# Patient Record
Sex: Male | Born: 1955 | Race: White | Hispanic: No | Marital: Single | Smoking: Former smoker
Health system: Southern US, Community
[De-identification: ages and names within clinical notes are randomized; demographics above are authoritative.]

## PROBLEM LIST (undated history)

## (undated) DIAGNOSIS — M1990 Unspecified osteoarthritis, unspecified site: Secondary | ICD-10-CM

## (undated) DIAGNOSIS — R7302 Impaired glucose tolerance (oral): Secondary | ICD-10-CM

## (undated) DIAGNOSIS — E785 Hyperlipidemia, unspecified: Secondary | ICD-10-CM

## (undated) DIAGNOSIS — Z87442 Personal history of urinary calculi: Secondary | ICD-10-CM

## (undated) DIAGNOSIS — T7840XA Allergy, unspecified, initial encounter: Secondary | ICD-10-CM

## (undated) DIAGNOSIS — S83206A Unspecified tear of unspecified meniscus, current injury, right knee, initial encounter: Secondary | ICD-10-CM

## (undated) DIAGNOSIS — R0600 Dyspnea, unspecified: Secondary | ICD-10-CM

## (undated) DIAGNOSIS — N400 Enlarged prostate without lower urinary tract symptoms: Secondary | ICD-10-CM

## (undated) DIAGNOSIS — J45909 Unspecified asthma, uncomplicated: Secondary | ICD-10-CM

## (undated) DIAGNOSIS — E119 Type 2 diabetes mellitus without complications: Secondary | ICD-10-CM

## (undated) HISTORY — DX: Benign prostatic hyperplasia without lower urinary tract symptoms: N40.0

## (undated) HISTORY — PX: CATARACT EXTRACTION: SUR2

## (undated) HISTORY — DX: Allergy, unspecified, initial encounter: T78.40XA

## (undated) HISTORY — DX: Impaired glucose tolerance (oral): R73.02

## (undated) HISTORY — PX: WISDOM TOOTH EXTRACTION: SHX21

## (undated) HISTORY — DX: Unspecified tear of unspecified meniscus, current injury, right knee, initial encounter: S83.206A

## (undated) HISTORY — DX: Unspecified asthma, uncomplicated: J45.909

## (undated) HISTORY — DX: Hyperlipidemia, unspecified: E78.5

---
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
## 2006-04-20 ENCOUNTER — Ambulatory Visit (HOSPITAL_COMMUNITY): Admission: RE | Admit: 2006-04-20 | Discharge: 2006-04-20 | Payer: Self-pay | Admitting: Pulmonary Disease

## 2012-05-12 ENCOUNTER — Encounter: Payer: Self-pay | Admitting: Internal Medicine

## 2012-05-12 DIAGNOSIS — J45909 Unspecified asthma, uncomplicated: Secondary | ICD-10-CM

## 2012-05-12 DIAGNOSIS — Z0000 Encounter for general adult medical examination without abnormal findings: Secondary | ICD-10-CM | POA: Insufficient documentation

## 2012-05-12 HISTORY — DX: Unspecified asthma, uncomplicated: J45.909

## 2012-05-19 ENCOUNTER — Encounter: Payer: Self-pay | Admitting: Internal Medicine

## 2012-05-19 ENCOUNTER — Ambulatory Visit (INDEPENDENT_AMBULATORY_CARE_PROVIDER_SITE_OTHER): Payer: 59 | Admitting: Internal Medicine

## 2012-05-19 ENCOUNTER — Other Ambulatory Visit (INDEPENDENT_AMBULATORY_CARE_PROVIDER_SITE_OTHER): Payer: 59

## 2012-05-19 VITALS — BP 120/58 | HR 101 | Temp 98.7°F | Ht 64.0 in | Wt 205.2 lb

## 2012-05-19 DIAGNOSIS — Z0000 Encounter for general adult medical examination without abnormal findings: Secondary | ICD-10-CM

## 2012-05-19 DIAGNOSIS — R7303 Prediabetes: Secondary | ICD-10-CM | POA: Insufficient documentation

## 2012-05-19 DIAGNOSIS — E1169 Type 2 diabetes mellitus with other specified complication: Secondary | ICD-10-CM | POA: Insufficient documentation

## 2012-05-19 DIAGNOSIS — Z23 Encounter for immunization: Secondary | ICD-10-CM

## 2012-05-19 DIAGNOSIS — R7302 Impaired glucose tolerance (oral): Secondary | ICD-10-CM

## 2012-05-19 DIAGNOSIS — N400 Enlarged prostate without lower urinary tract symptoms: Secondary | ICD-10-CM

## 2012-05-19 HISTORY — DX: Impaired glucose tolerance (oral): R73.02

## 2012-05-19 HISTORY — DX: Benign prostatic hyperplasia without lower urinary tract symptoms: N40.0

## 2012-05-19 LAB — URINALYSIS, ROUTINE W REFLEX MICROSCOPIC
Hgb urine dipstick: NEGATIVE
Nitrite: NEGATIVE
Specific Gravity, Urine: 1.025 (ref 1.000–1.030)
Urine Glucose: NEGATIVE
Urobilinogen, UA: 0.2 (ref 0.0–1.0)

## 2012-05-19 LAB — CBC WITH DIFFERENTIAL/PLATELET
Eosinophils Absolute: 0.4 10*3/uL (ref 0.0–0.7)
HCT: 42.8 % (ref 39.0–52.0)
Lymphs Abs: 3.2 10*3/uL (ref 0.7–4.0)
MCHC: 33.4 g/dL (ref 30.0–36.0)
Neutrophils Relative %: 60.9 % (ref 43.0–77.0)
Platelets: 315 10*3/uL (ref 150.0–400.0)
RDW: 13.6 % (ref 11.5–14.6)

## 2012-05-19 LAB — PSA: PSA: 0.87 ng/mL (ref 0.10–4.00)

## 2012-05-19 LAB — HEPATIC FUNCTION PANEL
ALT: 36 U/L (ref 0–53)
Albumin: 4.1 g/dL (ref 3.5–5.2)
Total Protein: 7.1 g/dL (ref 6.0–8.3)

## 2012-05-19 LAB — LIPID PANEL
HDL: 57.7 mg/dL (ref >39.00)
LDL Cholesterol: 109 mg/dL — ABNORMAL HIGH (ref 0–99)
Total CHOL/HDL Ratio: 3
Triglycerides: 97 mg/dL (ref 0.0–149.0)

## 2012-05-19 LAB — BASIC METABOLIC PANEL
Calcium: 9.7 mg/dL (ref 8.4–10.5)
Chloride: 105 mEq/L (ref 96–112)
Creatinine, Ser: 0.9 mg/dL (ref 0.4–1.5)
Glucose, Bld: 146 mg/dL — ABNORMAL HIGH (ref 70–99)
Potassium: 4.3 mEq/L (ref 3.5–5.1)

## 2012-05-19 MED ORDER — ASPIRIN 81 MG PO TBEC: 81.0000 mg | DELAYED_RELEASE_TABLET | Freq: Every day | ORAL | Status: AC

## 2012-05-19 MED ORDER — DUTASTERIDE 0.5 MG PO CAPS: 0.5000 mg | ORAL_CAPSULE | Freq: Every day | ORAL | Status: DC

## 2012-05-19 MED ORDER — FLUTICASONE-SALMETEROL 100-50 MCG/DOSE IN AEPB: 2.0000 | INHALATION_SPRAY | Freq: Every day | RESPIRATORY_TRACT | Status: DC

## 2012-05-19 MED ORDER — IPRATROPIUM-ALBUTEROL 18-103 MCG/ACT IN AERO: 2.0000 | INHALATION_SPRAY | Freq: Four times a day (QID) | RESPIRATORY_TRACT | Status: DC

## 2012-05-19 NOTE — Patient Instructions (Addendum)
Please sign the Release of Information form to get records from Dr Brunetta Jeans had the tetanus shot today Please call if you change your mind about the screening colonoscopy Please go to LAB in the Basement for the blood and/or urine tests to be done today You will be contacted by phone if any changes need to be made immediately.  Otherwise, you will receive a letter about your results with an explanation. Please start the Aspirin 81 mg - 1 per day - Coated only Please return in 1 year for your yearly visit, or sooner if needed, with Lab testing done 3-5 days before

## 2012-05-20 ENCOUNTER — Encounter: Payer: Self-pay | Admitting: Internal Medicine

## 2012-05-20 NOTE — Progress Notes (Signed)
Subjective:    Patient ID: Aaron Drake, male    DOB: 1956/01/12, 56 y.o.   MRN: [id number]  HPI  Here for wellness and to establish as new pt;  Overall doing ok;  Pt denies CP, worsening SOB, DOE, wheezing, orthopnea, PND, worsening LE edema, palpitations, dizziness or syncope.  Pt denies neurological change such as new Headache, facial or extremity weakness.  Pt denies polydipsia, polyuria, or low sugar symptoms. Pt states overall good compliance with treatment and medications, good tolerability, and trying to follow lower cholesterol diet.  Pt denies worsening depressive symptoms, suicidal ideation or panic. No fever, wt loss, night sweats, loss of appetite, or other constitutional symptoms.  Pt states good ability with ADL's, low fall risk, home safety reviewed and adequate, no significant changes in hearing or vision, and occasionally active with exercise.  Neds med refills as has been working well.   Past Medical History  Diagnosis Date  . Asthma 05/12/2012  . BPH (benign prostatic hyperplasia) 05/19/2012  . Allergy   . Hyperlipidemia   . Impaired glucose tolerance 05/19/2012   Past Surgical History  Procedure Date  . Cataract extraction     right    reports that he has quit smoking. He has never used smokeless tobacco. He reports that he drinks about .6 ounces of alcohol per week. He reports that he does not use illicit drugs. family history includes Aortic aneurysm in his father; COPD in his father; Heart disease in his mother; and Stroke in his father. Allergies  Allergen Reactions  . Lipitor (Atorvastatin)    Current Outpatient Prescriptions on File Prior to Visit  Medication Sig Dispense Refill  . albuterol-ipratropium (COMBIVENT) 18-103 MCG/ACT inhaler Inhale 2 puffs into the lungs 4 (four) times daily.  4 Inhaler  3  . Fluticasone-Salmeterol (ADVAIR) 100-50 MCG/DOSE AEPB Inhale 2 puffs into the lungs daily.  180 each  3   Review of Systems Review of Systems  Constitutional:  Negative for diaphoresis, activity change, appetite change and unexpected weight change.  HENT: Negative for hearing loss, ear pain, facial swelling, mouth sores and neck stiffness.   Eyes: Negative for pain, redness and visual disturbance.  Respiratory: Negative for shortness of breath and wheezing.   Cardiovascular: Negative for chest pain and palpitations.  Gastrointestinal: Negative for diarrhea, blood in stool, abdominal distention and rectal pain.  Genitourinary: Negative for hematuria, flank pain and decreased urine volume.  Musculoskeletal: Negative for myalgias and joint swelling.  Skin: Negative for color change and wound.  Neurological: Negative for syncope and numbness.  Hematological: Negative for adenopathy.  Psychiatric/Behavioral: Negative for hallucinations, self-injury, decreased concentration and agitation.      Objective:   Physical Exam BP 120/58  Pulse 101  Temp 98.7 F (37.1 C) (Oral)  Ht 5\' 4"  (1.626 m)  Wt 205 lb 4 oz (93.101 kg)  BMI 35.23 kg/m2  SpO2 95% Physical Exam  VS noted Constitutional: Pt is oriented to person, place, and time. Appears well-developed and well-nourished.  HENT:  Head: Normocephalic and atraumatic.  Right Ear: External ear normal.  Left Ear: External ear normal.  Nose: Nose normal.  Mouth/Throat: Oropharynx is clear and moist.  Eyes: Conjunctivae and EOM are normal. Pupils are equal, round, and reactive to light.  Neck: Normal range of motion. Neck supple. No JVD present. No tracheal deviation present.  Cardiovascular: Normal rate, regular rhythm, normal heart sounds and intact distal pulses.   Pulmonary/Chest: Effort normal and breath sounds normal.  Abdominal: Soft. Bowel  sounds are normal. There is no tenderness.  Musculoskeletal: Normal range of motion. Exhibits no edema.  Lymphadenopathy:  Has no cervical adenopathy.  Neurological: Pt is alert and oriented to person, place, and time. Pt has normal reflexes. No cranial nerve  deficit. Motor/gait intact Skin: Skin is warm and dry. No rash noted.  Psychiatric:  Has  normal mood and affect. Behavior is normal.     Assessment & Plan:

## 2012-05-20 NOTE — Assessment & Plan Note (Signed)

## 2012-05-23 ENCOUNTER — Encounter: Payer: Self-pay | Admitting: Internal Medicine

## 2012-05-25 ENCOUNTER — Other Ambulatory Visit (INDEPENDENT_AMBULATORY_CARE_PROVIDER_SITE_OTHER): Payer: 59

## 2012-05-25 ENCOUNTER — Encounter: Payer: Self-pay | Admitting: Internal Medicine

## 2012-05-25 DIAGNOSIS — Z0000 Encounter for general adult medical examination without abnormal findings: Secondary | ICD-10-CM

## 2012-05-25 LAB — URINALYSIS, ROUTINE W REFLEX MICROSCOPIC
Bilirubin Urine: NEGATIVE
Hgb urine dipstick: NEGATIVE
Ketones, ur: NEGATIVE
Nitrite: NEGATIVE
Urine Glucose: NEGATIVE
Urobilinogen, UA: 0.2 (ref 0.0–1.0)

## 2012-05-25 LAB — CBC WITH DIFFERENTIAL/PLATELET
Basophils Absolute: 0.1 10*3/uL (ref 0.0–0.1)
Basophils Relative: 0.9 % (ref 0.0–3.0)
Eosinophils Absolute: 0.5 10*3/uL (ref 0.0–0.7)
HCT: 43.1 % (ref 39.0–52.0)
Lymphocytes Relative: 32.2 % (ref 12.0–46.0)
MCHC: 33.2 g/dL (ref 30.0–36.0)
MCV: 86.8 fl (ref 78.0–100.0)
Monocytes Relative: 5.5 % (ref 3.0–12.0)
Neutro Abs: 4.5 10*3/uL (ref 1.4–7.7)
Platelets: 308 10*3/uL (ref 150.0–400.0)

## 2012-05-25 LAB — HEPATIC FUNCTION PANEL
Alkaline Phosphatase: 56 U/L (ref 39–117)
Bilirubin, Direct: 0.1 mg/dL (ref 0.0–0.3)
Total Bilirubin: 0.5 mg/dL (ref 0.3–1.2)
Total Protein: 7.2 g/dL (ref 6.0–8.3)

## 2012-05-25 LAB — LIPID PANEL: VLDL: 11.6 mg/dL (ref 0.0–40.0)

## 2012-05-25 LAB — BASIC METABOLIC PANEL
BUN: 14 mg/dL (ref 6–23)
Calcium: 10 mg/dL (ref 8.4–10.5)
GFR: 79.83 mL/min (ref >60.00)
Potassium: 5.2 mEq/L — ABNORMAL HIGH (ref 3.5–5.1)

## 2012-05-25 LAB — LDL CHOLESTEROL, DIRECT: Direct LDL: 115.2 mg/dL

## 2012-06-05 ENCOUNTER — Telehealth: Payer: Self-pay

## 2012-06-05 NOTE — Telephone Encounter (Signed)
Patient requesting a new prescription for Combivent Respimat.  The patient states the old version of Combivent is no longer being produced that  respimat has replaced. Please send to [facility].

## 2012-06-05 NOTE — Telephone Encounter (Signed)
Ok to change  - to [facility] per office refill policy

## 2012-06-06 MED ORDER — IPRATROPIUM-ALBUTEROL 20-100 MCG/ACT IN AERS: 2.0000 | INHALATION_SPRAY | Freq: Four times a day (QID) | RESPIRATORY_TRACT | Status: DC

## 2012-06-06 NOTE — Telephone Encounter (Signed)
Sent in new inhaler per patient request to [facility].

## 2013-03-06 ENCOUNTER — Other Ambulatory Visit: Payer: Self-pay | Admitting: Internal Medicine

## 2013-12-18 ENCOUNTER — Telehealth: Payer: Self-pay

## 2013-12-18 MED ORDER — IPRATROPIUM-ALBUTEROL 20-100 MCG/ACT IN AERS: 1.0000 | INHALATION_SPRAY | Freq: Four times a day (QID) | RESPIRATORY_TRACT | Status: DC | PRN

## 2013-12-18 MED ORDER — DUTASTERIDE 0.5 MG PO CAPS: 0.5000 mg | ORAL_CAPSULE | Freq: Every day | ORAL | Status: DC

## 2013-12-18 MED ORDER — FLUTICASONE-SALMETEROL 100-50 MCG/DOSE IN AEPB: 2.0000 | INHALATION_SPRAY | Freq: Every day | RESPIRATORY_TRACT | Status: DC

## 2013-12-18 NOTE — Telephone Encounter (Signed)
refill 

## 2014-01-10 ENCOUNTER — Ambulatory Visit (INDEPENDENT_AMBULATORY_CARE_PROVIDER_SITE_OTHER): Payer: 59 | Admitting: Internal Medicine

## 2014-01-10 ENCOUNTER — Encounter: Payer: Self-pay | Admitting: Internal Medicine

## 2014-01-10 VITALS — BP 130/78 | HR 105 | Temp 98.4°F | Ht 64.0 in | Wt 220.2 lb

## 2014-01-10 DIAGNOSIS — Z0000 Encounter for general adult medical examination without abnormal findings: Secondary | ICD-10-CM

## 2014-01-10 DIAGNOSIS — J45909 Unspecified asthma, uncomplicated: Secondary | ICD-10-CM

## 2014-01-10 DIAGNOSIS — M25512 Pain in left shoulder: Secondary | ICD-10-CM

## 2014-01-10 DIAGNOSIS — R7302 Impaired glucose tolerance (oral): Secondary | ICD-10-CM

## 2014-01-10 DIAGNOSIS — Z23 Encounter for immunization: Secondary | ICD-10-CM

## 2014-01-10 MED ORDER — UMECLIDINIUM-VILANTEROL 62.5-25 MCG/INH IN AEPB: 1.0000 "application " | INHALATION_SPRAY | Freq: Every day | RESPIRATORY_TRACT | Status: DC

## 2014-01-10 NOTE — Progress Notes (Signed)
Pre-visit discussion using our clinic review tool. No additional management support is needed unless otherwise documented below in the visit note.  

## 2014-01-10 NOTE — Assessment & Plan Note (Signed)
Asympt, for a1c 

## 2014-01-10 NOTE — Assessment & Plan Note (Signed)
Ok to change to [employer],

## 2014-01-10 NOTE — Progress Notes (Signed)
Subjective:    Patient ID: Aaron Drake, male    DOB: [birth year], 58 y.o.   MRN: [id number]  HPI  Here for wellness and f/u;  Overall doing ok;  Pt denies CP, worsening SOB, DOE, wheezing, orthopnea, PND, worsening LE edema, palpitations, dizziness or syncope.  Pt denies neurological change such as new headache, facial or extremity weakness.  Pt denies polydipsia, polyuria, or low sugar symptoms. Pt states overall good compliance with treatment and medications, good tolerability, and has been trying to follow lower cholesterol diet.  Pt denies worsening depressive symptoms, suicidal ideation or panic. No fever, night sweats, wt loss, loss of appetite, or other constitutional symptoms.  Pt states good ability with ADL's, has low fall risk, home safety reviewed and adequate, no other significant changes in hearing or vision, and only occasionally active with exercise.  Declines colonoscopy.  Does not like the combivent, wants to change if possible. No acute complaints Past Medical History  Diagnosis Date  . Asthma 05/12/2012  . BPH (benign prostatic hyperplasia) 05/19/2012  . Allergy   . Hyperlipidemia   . Impaired glucose tolerance 05/19/2012   Past Surgical History  Procedure Laterality Date  . Cataract extraction      right    reports that he has quit smoking. He has never used smokeless tobacco. He reports that he drinks about 0.6 ounces of alcohol per week. He reports that he does not use illicit drugs. family history includes Aortic aneurysm in his father; COPD in his father; Heart disease in his mother; Stroke in his father. Allergies  Allergen Reactions  . Lipitor [Atorvastatin]    Current Outpatient Prescriptions on File Prior to Visit  Medication Sig Dispense Refill  . dutasteride (AVODART) 0.5 MG capsule Take 1 capsule (0.5 mg total) by mouth daily.  90 capsule  3  . Fluticasone-Salmeterol (ADVAIR) 100-50 MCG/DOSE AEPB Inhale 2 puffs into the lungs daily.  180 each  3  .  Ipratropium-Albuterol (COMBIVENT RESPIMAT) 20-100 MCG/ACT AERS respimat Inhale 1 puff into the lungs every 6 (six) hours as needed for wheezing.  3 Inhaler  3   No current facility-administered medications on file prior to visit.   Review of Systems Constitutional: Negative for diaphoresis, activity change, appetite change or unexpected weight change.  HENT: Negative for hearing loss, ear pain, facial swelling, mouth sores and neck stiffness.   Eyes: Negative for pain, redness and visual disturbance.  Respiratory: Negative for shortness of breath and wheezing.   Cardiovascular: Negative for chest pain and palpitations.  Gastrointestinal: Negative for diarrhea, blood in stool, abdominal distention or other pain Genitourinary: Negative for hematuria, flank pain or change in urine volume.  Musculoskeletal: Negative for myalgias and joint swelling.  Skin: Negative for color change and wound.  Neurological: Negative for syncope and numbness. other than noted Hematological: Negative for adenopathy.  Psychiatric/Behavioral: Negative for hallucinations, self-injury, decreased concentration and agitation.      Objective:   Physical Exam BP 130/78  Pulse 105  Temp(Src) 98.4 F (36.9 C) (Oral)  Ht 5\' 4"  (1.626 m)  Wt 220 lb 4 oz (99.905 kg)  BMI 37.79 kg/m2  SpO2 95% VS noted,  Constitutional: Pt is oriented to person, place, and time. Appears well-developed and well-nourished.  Head: Normocephalic and atraumatic.  Right Ear: External ear normal.  Left Ear: External ear normal.  Nose: Nose normal.  Mouth/Throat: Oropharynx is clear and moist.  Eyes: Conjunctivae and EOM are normal. Pupils are equal, round, and reactive to light.  Neck: Normal range of motion. Neck supple. No JVD present. No tracheal deviation present.  Cardiovascular: Normal rate, regular rhythm, normal heart sounds and intact distal pulses.   Pulmonary/Chest: Effort normal and breath sounds normal.  Abdominal: Soft.  Bowel sounds are normal. There is no tenderness. No HSM  Musculoskeletal: Normal range of motion. Exhibits no edema.  Lymphadenopathy:  Has no cervical adenopathy.  Neurological: Pt is alert and oriented to person, place, and time. Pt has normal reflexes. No cranial nerve deficit.  Skin: Skin is warm and dry. No rash noted.  Psychiatric:  Has  normal mood and affect. Behavior is normal.     Assessment & Plan:

## 2014-01-10 NOTE — Patient Instructions (Addendum)
You had the flu shot today Please take all new medication as prescribed - the anoro ellipta  OK to stop the combivent if you like the anoro ellipta Please continue all other medications as before, and refills have been done if requested. Please have the pharmacy call with any other refills you may need. Please continue your efforts at being more active, low cholesterol diet, and weight control. You are otherwise up to date with prevention measures today. Please keep your appointments with your specialists as you may have planned  You will be contacted regarding the referral for: Dr Smith/sport medicine (in our office) for the left shoulder  Please go to the LAB in the Basement (turn left off the elevator) for the tests to be done today You will be contacted by phone if any changes need to be made immediately.  Otherwise, you will receive a letter about your results with an explanation, but please check with MyChart first.  Please return in 1 year for your yearly visit, or sooner if needed, with Lab testing done 3-5 days before

## 2014-01-10 NOTE — Assessment & Plan Note (Signed)

## 2014-01-11 ENCOUNTER — Other Ambulatory Visit (INDEPENDENT_AMBULATORY_CARE_PROVIDER_SITE_OTHER): Payer: 59

## 2014-01-11 DIAGNOSIS — E785 Hyperlipidemia, unspecified: Secondary | ICD-10-CM

## 2014-01-11 DIAGNOSIS — Z0000 Encounter for general adult medical examination without abnormal findings: Secondary | ICD-10-CM

## 2014-01-11 DIAGNOSIS — R7309 Other abnormal glucose: Secondary | ICD-10-CM

## 2014-01-11 DIAGNOSIS — R7302 Impaired glucose tolerance (oral): Secondary | ICD-10-CM

## 2014-01-11 LAB — URINALYSIS, ROUTINE W REFLEX MICROSCOPIC
Bilirubin Urine: NEGATIVE
KETONES UR: NEGATIVE
LEUKOCYTES UA: NEGATIVE
Nitrite: NEGATIVE
Specific Gravity, Urine: 1.02 (ref 1.000–1.030)
Total Protein, Urine: NEGATIVE
UROBILINOGEN UA: 0.2 (ref 0.0–1.0)
Urine Glucose: NEGATIVE
pH: 7 (ref 5.0–8.0)

## 2014-01-11 LAB — BASIC METABOLIC PANEL
BUN: 11 mg/dL (ref 6–23)
CALCIUM: 10.1 mg/dL (ref 8.4–10.5)
CO2: 26 mEq/L (ref 19–32)
Chloride: 104 mEq/L (ref 96–112)
Creatinine, Ser: 0.8 mg/dL (ref 0.4–1.5)
GFR: 99.31 mL/min (ref >60.00)
GLUCOSE: 113 mg/dL — AB (ref 70–99)
POTASSIUM: 4.6 meq/L (ref 3.5–5.1)
SODIUM: 138 meq/L (ref 135–145)

## 2014-01-11 LAB — CBC WITH DIFFERENTIAL/PLATELET
BASOS ABS: 0 10*3/uL (ref 0.0–0.1)
Basophils Relative: 0.4 % (ref 0.0–3.0)
Eosinophils Absolute: 0.7 10*3/uL (ref 0.0–0.7)
Eosinophils Relative: 7.1 % — ABNORMAL HIGH (ref 0.0–5.0)
HCT: 44.9 % (ref 39.0–52.0)
HEMOGLOBIN: 14.6 g/dL (ref 13.0–17.0)
Lymphocytes Relative: 30.7 % (ref 12.0–46.0)
Lymphs Abs: 3 10*3/uL (ref 0.7–4.0)
MCHC: 32.5 g/dL (ref 30.0–36.0)
MCV: 88.4 fl (ref 78.0–100.0)
MONO ABS: 0.7 10*3/uL (ref 0.1–1.0)
Monocytes Relative: 7.2 % (ref 3.0–12.0)
Neutro Abs: 5.3 10*3/uL (ref 1.4–7.7)
Neutrophils Relative %: 54.6 % (ref 43.0–77.0)
PLATELETS: 355 10*3/uL (ref 150.0–400.0)
RBC: 5.08 Mil/uL (ref 4.22–5.81)
RDW: 14.7 % — AB (ref 11.5–14.6)
WBC: 9.8 10*3/uL (ref 4.5–10.5)

## 2014-01-11 LAB — TSH: TSH: 1.51 u[IU]/mL (ref 0.35–5.50)

## 2014-01-11 LAB — LIPID PANEL
CHOL/HDL RATIO: 4
Cholesterol: 206 mg/dL — ABNORMAL HIGH (ref 0–200)
HDL: 53.5 mg/dL (ref >39.00)
Triglycerides: 101 mg/dL (ref 0.0–149.0)
VLDL: 20.2 mg/dL (ref 0.0–40.0)

## 2014-01-11 LAB — HEPATIC FUNCTION PANEL
ALK PHOS: 66 U/L (ref 39–117)
ALT: 36 U/L (ref 0–53)
AST: 24 U/L (ref 0–37)
Albumin: 4.2 g/dL (ref 3.5–5.2)
BILIRUBIN DIRECT: 0.1 mg/dL (ref 0.0–0.3)
BILIRUBIN TOTAL: 0.7 mg/dL (ref 0.3–1.2)
Total Protein: 7.5 g/dL (ref 6.0–8.3)

## 2014-01-11 LAB — HEMOGLOBIN A1C: Hgb A1c MFr Bld: 7.8 % — ABNORMAL HIGH (ref 4.6–6.5)

## 2014-01-11 LAB — PSA: PSA: 0.21 ng/mL (ref 0.10–4.00)

## 2014-01-11 LAB — LDL CHOLESTEROL, DIRECT: LDL DIRECT: 136.6 mg/dL

## 2014-01-13 ENCOUNTER — Other Ambulatory Visit: Payer: Self-pay | Admitting: Internal Medicine

## 2014-01-13 MED ORDER — METFORMIN HCL ER 500 MG PO TB24: 500.0000 mg | ORAL_TABLET | Freq: Every day | ORAL | Status: DC

## 2014-01-13 MED ORDER — SIMVASTATIN 20 MG PO TABS: 20.0000 mg | ORAL_TABLET | Freq: Every day | ORAL | Status: DC

## 2014-01-18 ENCOUNTER — Ambulatory Visit: Payer: 59 | Admitting: Family Medicine

## 2014-01-28 ENCOUNTER — Encounter: Payer: Self-pay | Admitting: Family Medicine

## 2014-01-28 ENCOUNTER — Other Ambulatory Visit (INDEPENDENT_AMBULATORY_CARE_PROVIDER_SITE_OTHER): Payer: 59

## 2014-01-28 ENCOUNTER — Ambulatory Visit (INDEPENDENT_AMBULATORY_CARE_PROVIDER_SITE_OTHER): Payer: 59 | Admitting: Family Medicine

## 2014-01-28 VITALS — BP 124/70 | HR 97 | Temp 98.6°F | Resp 16 | Wt 218.4 lb

## 2014-01-28 DIAGNOSIS — M19019 Primary osteoarthritis, unspecified shoulder: Secondary | ICD-10-CM

## 2014-01-28 DIAGNOSIS — M25512 Pain in left shoulder: Secondary | ICD-10-CM

## 2014-01-28 DIAGNOSIS — M75102 Unspecified rotator cuff tear or rupture of left shoulder, not specified as traumatic: Secondary | ICD-10-CM

## 2014-01-28 DIAGNOSIS — M25519 Pain in unspecified shoulder: Secondary | ICD-10-CM

## 2014-01-28 DIAGNOSIS — M12812 Other specific arthropathies, not elsewhere classified, left shoulder: Secondary | ICD-10-CM

## 2014-01-28 NOTE — Progress Notes (Signed)
[facility] [address], [place] [address] Phone: [phone] Subjective:    I'm seeing this patient by the request  of:  Cathlean Cower, MD   CC: Left shoulder pain  [id number] SANAV REMER is a 59 y.o. male coming in with complaint of left shoulder pain. Patient states he has had multiple months but the last 2 weeks has had an exacerbation. Patient is a remember any true injury to this shoulder. Patient is ordered a [employer] and did have to do some moving of heavy objects that may have exacerbated the problem. Patient states that it seems to be localized in the posterior lateral aspect of the shoulder without any significant radiation down the arm or any numbness or weakness of the upper extremity. Pain can wake him up at night. He has noticed some mild improvement with glucosamine over-the-counter. Other than that he has not tried any other modalities. Patient states that the severity can go from 1/10 to as severe as 8/10 depending on what is going on.     Past medical history, social, surgical and family history all reviewed in electronic medical record.   Review of Systems: No headache, visual changes, nausea, vomiting, diarrhea, constipation, dizziness, abdominal pain, skin rash, fevers, chills, night sweats, weight loss, swollen lymph nodes, body aches, joint swelling, muscle aches, chest pain, shortness of breath, mood changes.   Objective Blood pressure 124/70, pulse 97, temperature 98.6 F (37 C), temperature source Oral, resp. rate 16, weight 218 lb 6.4 oz (99.066 kg), SpO2 95.00%.  General: No apparent distress alert and oriented x3 mood and affect normal, dressed appropriately.  HEENT: Pupils equal, extraocular movements intact  Respiratory: Patient's speak in full sentences and does not appear short of breath  Cardiovascular: No lower extremity edema, non tender, no erythema  Skin: Warm dry intact with no signs of infection or rash on  extremities or on axial skeleton.  Abdomen: Soft nontender  Neuro: Cranial nerves II through XII are intact, neurovascularly intact in all extremities with 2+ DTRs and 2+ pulses.  Lymph: No lymphadenopathy of posterior or anterior cervical chain or axillae bilaterally.  Gait normal with good balance and coordination.  MSK:  Non tender with full range of motion and good stability and symmetric strength and tone of  elbows, wrist, hip, knee and ankles bilaterally.  Shoulder: Left Inspection reveals no abnormalities, atrophy or asymmetry. Palpation is normal with no tenderness over AC joint or bicipital groove. ROM is full in all planes passively Rotator cuff strength normal throughout.  signs of impingement with negative Neer and Hawkin's tests, negative empty can sign. Speeds and Yergason's tests normal. No labral pathology noted with negative Obrien's, negative clunk and good stability. Normal scapular function observed. No painful arc and no drop arm sign. No apprehension sign Contralateral shoulder unremarkable  MSK US performed of: Left shoulder This study was ordered, performed, and interpreted by Charlann Boxer D.O.  Shoulder:   Supraspinatus:  Patient does have an intersubstance tear that appears to be chronic in nature with no significant hypoechoic changes. There is no retraction noted. Underlying osteoarthritis noted. Infraspinatus:  Appears normal on long and transverse views. Subscapularis:  Degenerative tearing noted.. Teres Minor:  Appears normal on long and transverse views. AC joint:  Moderate osteoarthritic changes with moderate hypoechoic changes to Glenohumeral Joint:  Moderate osteophytic changes Glenoid Labrum:  Intact without visualized tears. Biceps Tendon:  Appears normal on long and transverse views, no fraying of tendon,  tendon located in intertubercular groove, no subluxation with shoulder internal or external rotation. No increased power doppler  signal.  Impression: Acute on chronic rotator cuff tear with underlying arthritis.  Procedure: Real-time Ultrasound Guided Injection of left glenohumeral joint Device: GE Logiq E  Ultrasound guided injection is preferred based studies that show increased duration, increased effect, greater accuracy, decreased procedural pain, increased response rate with ultrasound guided versus blind injection.  Verbal informed consent obtained.  Time-out conducted.  Noted no overlying erythema, induration, or other signs of local infection.  Skin prepped in a sterile fashion.  Local anesthesia: Topical Ethyl chloride.  With sterile technique and under real time ultrasound guidance:  Joint visualized.  23g 1  inch needle inserted posterior approach. Pictures taken for needle placement. Patient did have injection of 2 cc of 1% lidocaine, 2 cc of 0.5% Marcaine, and 1cc of Kenalog 40 mg/dL. Completed without difficulty  Pain immediately resolved suggesting accurate placement of the medication.  Advised to call if fevers/chills, erythema, induration, drainage, or persistent bleeding.  Images permanently stored and available for review in the ultrasound unit.  Impression: Technically successful ultrasound guided injection.     Impression and Recommendations:     This case required medical decision making of moderate complexity.

## 2014-01-28 NOTE — Patient Instructions (Signed)
Good to meet you Avoid any significant lifting next 24 hours.  Ice 20 minutes 2 times a day Exercises most days of the week.  Take tylenol 650 mg three times a day is the best evidence based medicine we have for arthritis.  Glucosamine sulfate 750mg  twice a day is a supplement that has been shown to help moderate to severe arthritis. Vitamin D 2000 IU daily Fish oil 2 grams daily.  Tumeric 500mg  twice daily.  Capsaicin topically up to four times a day may also help with pain. Come back and see me in 3-4 weeks.

## 2014-01-28 NOTE — Progress Notes (Signed)
Pre visit review using our clinic review tool, if applicable. No additional management support is needed unless otherwise documented below in the visit note. 

## 2014-01-28 NOTE — Assessment & Plan Note (Signed)
Discussed diagnosis, prognosis, and rehabilitation exercises today. Home exercise program was given to patient and showed proper technique Theraband given. His customizing protocol in over-the-counter medications that could be beneficial. Patient was offered Mardene Celeste medications but states she would not like to take them. Patient back in 3-4 weeks. If he continues to have trouble may consider nitroglycerin patch.

## 2014-02-18 ENCOUNTER — Encounter: Payer: Self-pay | Admitting: Family Medicine

## 2014-02-18 ENCOUNTER — Ambulatory Visit (INDEPENDENT_AMBULATORY_CARE_PROVIDER_SITE_OTHER): Payer: 59 | Admitting: Family Medicine

## 2014-02-18 VITALS — BP 144/82 | HR 105

## 2014-02-18 DIAGNOSIS — M12812 Other specific arthropathies, not elsewhere classified, left shoulder: Secondary | ICD-10-CM

## 2014-02-18 DIAGNOSIS — M75102 Unspecified rotator cuff tear or rupture of left shoulder, not specified as traumatic: Principal | ICD-10-CM

## 2014-02-18 DIAGNOSIS — M19019 Primary osteoarthritis, unspecified shoulder: Secondary | ICD-10-CM

## 2014-02-18 NOTE — Assessment & Plan Note (Signed)
Patient is doing remarkably well overall. Discuss potentially other treatment options including nitroglycerin patch in formal physical therapy. Patient declined both. Patient is encouraged to continue the exercises 3 times a week. We discussed icing protocol and continue any over-the-counter medications. Patient will follow up again in 6 weeks. At that time if he continues to have pain which consider another intra-articular injection.

## 2014-02-18 NOTE — Progress Notes (Signed)
  [facility] [address] [place], [place] [address] Phone: [phone] Subjective:    CC: Left shoulder pain followup  [id number] Aaron Drake is a 58 y.o. male coming in with complaint of left shoulder pain follow up. Patient was given an injection for a rotator cuff arthropathy previously. Patient had been doing significantly well but has been doing more and more exercises as well as lifting which seems to still irritated. Patient states that he is approximately 45-50% better. Patient denies any radiation of pain and states that he has full range of motion which is better. Denies any new symptoms.     Past medical history, social, surgical and family history all reviewed in electronic medical record.   Review of Systems: No headache, visual changes, nausea, vomiting, diarrhea, constipation, dizziness, abdominal pain, skin rash, fevers, chills, night sweats, weight loss, swollen lymph nodes, body aches, joint swelling, muscle aches, chest pain, shortness of breath, mood changes.   Objective Blood pressure 144/82, pulse 105, SpO2 97.00%.  General: No apparent distress alert and oriented x3 mood and affect normal, dressed appropriately.  HEENT: Pupils equal, extraocular movements intact  Respiratory: Patient's speak in full sentences and does not appear short of breath  Cardiovascular: No lower extremity edema, non tender, no erythema  Skin: Warm dry intact with no signs of infection or rash on extremities or on axial skeleton.  Abdomen: Soft nontender  Neuro: Cranial nerves II through XII are intact, neurovascularly intact in all extremities with 2+ DTRs and 2+ pulses.  Lymph: No lymphadenopathy of posterior or anterior cervical chain or axillae bilaterally.  Gait normal with good balance and coordination.  MSK:  Non tender with full range of motion and good stability and symmetric strength and tone of  elbows, wrist, hip, knee and ankles bilaterally.    Shoulder: Left Inspection reveals no abnormalities, atrophy or asymmetry. Palpation is normal with no tenderness over AC joint or bicipital groove. ROM is full in all planes passively and actively today which is an improvement Rotator cuff strength normal throughout.  signs of impingement with negative Neer and Hawkin's tests, negative empty can sign. Speeds and Yergason's tests normal. No labral pathology noted with negative Obrien's, negative clunk and good stability. Normal scapular function observed. No painful arc and no drop arm sign. No apprehension sign Contralateral shoulder unremarkable       Impression and Recommendations:     This case required medical decision making of moderate complexity.

## 2014-04-01 ENCOUNTER — Ambulatory Visit (INDEPENDENT_AMBULATORY_CARE_PROVIDER_SITE_OTHER): Payer: 59 | Admitting: Family Medicine

## 2014-04-01 ENCOUNTER — Encounter: Payer: Self-pay | Admitting: Family Medicine

## 2014-04-01 VITALS — BP 144/86 | HR 93

## 2014-04-01 DIAGNOSIS — M75102 Unspecified rotator cuff tear or rupture of left shoulder, not specified as traumatic: Principal | ICD-10-CM

## 2014-04-01 DIAGNOSIS — M19019 Primary osteoarthritis, unspecified shoulder: Secondary | ICD-10-CM

## 2014-04-01 DIAGNOSIS — M12812 Other specific arthropathies, not elsewhere classified, left shoulder: Secondary | ICD-10-CM

## 2014-04-01 NOTE — Progress Notes (Signed)
  [facility] [place], [place] [address] Phone: [phone] Subjective:    CC: Left shoulder pain followup  QBH:[id number] DENNIES COATE is a 58 y.o. male coming in with complaint of left shoulder pain follow up. Patient does have rotator cuff arthropathy and is now 3 months out from having previous injection. Patient has been doing home exercises, icing, over-the-counter medications. Patient states he has not made any significant improvement from previous exam. Patient initially states that he did have a reinjury approximately one week ago and has not been doing the exercises regularly. Patient denies any new symptoms such as radiation in the arms or any numbness. Patient is still able to do all activities of daily living and resting comfortably. Patient is taking no prescription pain medications for this.    Past medical history, social, surgical and family history all reviewed in electronic medical record.   Review of Systems: No headache, visual changes, nausea, vomiting, diarrhea, constipation, dizziness, abdominal pain, skin rash, fevers, chills, night sweats, weight loss, swollen lymph nodes, body aches, joint swelling, muscle aches, chest pain, shortness of breath, mood changes.   Objective There were no vitals taken for this visit.  General: No apparent distress alert and oriented x3 mood and affect normal, dressed appropriately.  HEENT: Pupils equal, extraocular movements intact  Respiratory: Patient's speak in full sentences and does not appear short of breath  Cardiovascular: No lower extremity edema, non tender, no erythema  Skin: Warm dry intact with no signs of infection or rash on extremities or on axial skeleton.  Abdomen: Soft nontender  Neuro: Cranial nerves II through XII are intact, neurovascularly intact in all extremities with 2+ DTRs and 2+ pulses.  Lymph: No lymphadenopathy of posterior or anterior cervical chain or axillae  bilaterally.  Gait normal with good balance and coordination.  MSK:  Non tender with full range of motion and good stability and symmetric strength and tone of  elbows, wrist, hip, knee and ankles bilaterally.  Shoulder: Left Inspection reveals no abnormalities, atrophy or asymmetry. Palpation is normal with no tenderness over AC joint or bicipital groove. ROM is full in all planes passively and actively today which is an improvement Rotator cuff strength normal throughout.  signs of impingement with positive Neer and Hawkin's tests, negative empty can sign. Speeds and Yergason's tests normal. No labral pathology noted with negative Obrien's, negative clunk and good stability. Normal scapular function observed. No painful arc and no drop arm sign. No apprehension sign Contralateral shoulder unremarkable No significant change from previous exam      Impression and Recommendations:     This case required medical decision making of moderate complexity.

## 2014-04-01 NOTE — Patient Instructions (Signed)
Good to see you Listen to your body.  We can do an injection or the patch we discussed Vitamin D3 2000 IU daily.  See me when you see me.

## 2014-04-01 NOTE — Assessment & Plan Note (Signed)
We discussed multiple treatment options patient again today. Patient has elected to continue with conservative therapy. Patient did have the reinjury he states that this has set him back which is minimal frustrated but he does not want corticosteroid injection and is now a candidate nitroglycerin patches. We discussed formal physical therapy again which patient declined as well. We discussed continuing icing and making sure patient is doing the right supplementation over-the-counter at the right dosing. Patient will come back again in 4-6 weeks for further evaluation. Patient I do think would respond well to a corticosteroid injection.  Spent greater than 25 minutes with patient face-to-face and had greater than 50% of counseling including as described above in assessment and plan.

## 2014-04-12 ENCOUNTER — Other Ambulatory Visit: Payer: Self-pay | Admitting: Internal Medicine

## 2014-06-03 ENCOUNTER — Encounter: Payer: Self-pay | Admitting: Internal Medicine

## 2014-06-17 MED ORDER — DUTASTERIDE 0.5 MG PO CAPS: 0.5000 mg | ORAL_CAPSULE | Freq: Every day | ORAL | Status: DC

## 2014-06-17 NOTE — Addendum Note (Signed)
Addended by: Biagio Borg on: 06/17/2014 07:25 PM   Modules accepted: Orders

## 2014-11-06 ENCOUNTER — Encounter: Payer: Self-pay | Admitting: Internal Medicine

## 2014-11-07 MED ORDER — IPRATROPIUM-ALBUTEROL 20-100 MCG/ACT IN AERS: 1.0000 | INHALATION_SPRAY | Freq: Four times a day (QID) | RESPIRATORY_TRACT | Status: DC

## 2014-11-07 NOTE — Addendum Note (Signed)
Addended by: Biagio Borg on: 11/07/2014 08:16 AM   Modules accepted: Orders, Medications

## 2015-01-18 ENCOUNTER — Encounter: Payer: Self-pay | Admitting: Internal Medicine

## 2015-01-18 NOTE — Telephone Encounter (Signed)
tammy - to see above, ok to offer pt ROV

## 2015-01-21 ENCOUNTER — Telehealth: Payer: Self-pay | Admitting: Internal Medicine

## 2015-01-21 NOTE — Telephone Encounter (Signed)
Pt. Is aware of OTC medication.

## 2015-01-21 NOTE — Telephone Encounter (Signed)
Patient is requesting a script for intal to get him through until appointment visit next week.  Patient uses [facility] [address].

## 2015-01-21 NOTE — Telephone Encounter (Signed)
Intal iinhaler is now generic OTC   OK to use OTC

## 2015-01-29 ENCOUNTER — Encounter: Payer: Self-pay | Admitting: Internal Medicine

## 2015-01-29 ENCOUNTER — Ambulatory Visit (INDEPENDENT_AMBULATORY_CARE_PROVIDER_SITE_OTHER): Payer: 59 | Admitting: Internal Medicine

## 2015-01-29 VITALS — BP 120/82 | HR 95 | Temp 97.9°F | Ht 64.0 in | Wt 209.0 lb

## 2015-01-29 DIAGNOSIS — Z0000 Encounter for general adult medical examination without abnormal findings: Secondary | ICD-10-CM

## 2015-01-29 DIAGNOSIS — E119 Type 2 diabetes mellitus without complications: Secondary | ICD-10-CM

## 2015-01-29 DIAGNOSIS — Z23 Encounter for immunization: Secondary | ICD-10-CM

## 2015-01-29 MED ORDER — MONTELUKAST SODIUM 10 MG PO TABS: 10.0000 mg | ORAL_TABLET | Freq: Every day | ORAL | Status: DC

## 2015-01-29 NOTE — Assessment & Plan Note (Addendum)
Still declines meds, working on wt loss,  to f/u any worsening symptoms or concerns, refuses labs

## 2015-01-29 NOTE — Progress Notes (Signed)
Pre visit review using our clinic review tool, if applicable. No additional management support is needed unless otherwise documented below in the visit note. 

## 2015-01-29 NOTE — Assessment & Plan Note (Signed)

## 2015-01-29 NOTE — Patient Instructions (Addendum)
You had the new Prevnar pneumonia shot today  Please continue all other medications as before, and refills have been done if requested.  Please have the pharmacy call with any other refills you may need.  Please continue your efforts at being more active, low cholesterol diet, and weight control.  You are otherwise up to date with prevention measures today.  Please keep your appointments with your specialists as you may have planned  Please send or fax your recent labs results to [phone]  Please return in 1 year for your yearly visit, or sooner if needed, with Lab testing done 3-5 days before

## 2015-01-29 NOTE — Progress Notes (Signed)
Subjective:    Patient ID: Aaron Drake, male    DOB: 08/03/1956, 59 y.o.   MRN: [id number]  HPI  Here for wellness and f/u;  Overall doing ok;  Pt denies CP, worsening SOB, DOE, wheezing, orthopnea, PND, worsening LE edema, palpitations, dizziness or syncope.  Pt denies neurological change such as new headache, facial or extremity weakness.  Pt denies polydipsia, polyuria, or low sugar symptoms. Pt states overall good compliance with treatment and medications, good tolerability, and has been trying to follow lower cholesterol diet.  Pt denies worsening depressive symptoms, suicidal ideation or panic. No fever, night sweats, wt loss, loss of appetite, or other constitutional symptoms.  Pt states good ability with ADL's, has low fall risk, home safety reviewed and adequate, no other significant changes in hearing or vision, and only occasionally active with exercise.   Did have some labs with going to [facility].  BS has been trending up lately. Declines further labs today.  Has not taken any DM meds as instructed last viist.  Stopped his advair x 3 wks, concerned about incrased blood sugars and ? Of relation to worsening eye pressures per opthomology.  Using combivent only, but not as good.  Refuses steroids. Asks for singulair for now, with allergy season coming. Wt Readings from Last 3 Encounters:  01/29/15 209 lb (94.802 kg)  01/28/14 218 lb 6.4 oz (99.066 kg)  01/10/14 220 lb 4 oz (99.905 kg)   Past Medical History  Diagnosis Date  . Asthma 05/12/2012  . BPH (benign prostatic hyperplasia) 05/19/2012  . Allergy   . Hyperlipidemia   . Impaired glucose tolerance 05/19/2012   Past Surgical History  Procedure Laterality Date  . Cataract extraction      right    reports that he has quit smoking. He has never used smokeless tobacco. He reports that he drinks about 0.6 oz of alcohol per week. He reports that he does not use illicit drugs. family history includes Aortic aneurysm in  his father; COPD in his father; Heart disease in his mother; Stroke in his father. Allergies  Allergen Reactions  . Lipitor [Atorvastatin]    Current Outpatient Prescriptions on File Prior to Visit  Medication Sig Dispense Refill  . dutasteride (AVODART) 0.5 MG capsule Take 1 capsule (0.5 mg total) by mouth daily. 90 capsule 3  . Ipratropium-Albuterol (COMBIVENT RESPIMAT) 20-100 MCG/ACT AERS respimat Inhale 1 puff into the lungs every 6 (six) hours. 12 g 3  . metFORMIN (GLUCOPHAGE XR) 500 MG 24 hr tablet Take 1 tablet (500 mg total) by mouth daily with breakfast. 90 tablet 3  . simvastatin (ZOCOR) 20 MG tablet Take 1 tablet (20 mg total) by mouth at bedtime. 90 tablet 3   No current facility-administered medications on file prior to visit.     Review of Systems Constitutional: Negative for increased diaphoresis, other activity, appetite or other siginficant weight change  HENT: Negative for worsening hearing loss, ear pain, facial swelling, mouth sores and neck stiffness.   Eyes: Negative for other worsening pain, redness or visual disturbance.  Respiratory: Negative for shortness of breath and wheezing.   Cardiovascular: Negative for chest pain and palpitations.  Gastrointestinal: Negative for diarrhea, blood in stool, abdominal distention or other pain Genitourinary: Negative for hematuria, flank pain or change in urine volume.  Musculoskeletal: Negative for myalgias or other joint complaints.  Skin: Negative for color change and wound.  Neurological: Negative for syncope and numbness. other than noted Hematological: Negative  for adenopathy. or other swelling Psychiatric/Behavioral: Negative for hallucinations, self-injury, decreased concentration or other worsening agitation.      Objective:   Physical Exam BP 120/82 mmHg  Pulse 95  Temp(Src) 97.9 F (36.6 C) (Oral)  Ht 5\' 4"  (1.626 m)  Wt 209 lb (94.802 kg)  BMI 35.86 kg/m2  SpO2 95% VS noted,  Constitutional: Pt is  oriented to person, place, and time. Appears well-developed and well-nourished.  Head: Normocephalic and atraumatic.  Right Ear: External ear normal.  Left Ear: External ear normal.  Nose: Nose normal.  Mouth/Throat: Oropharynx is clear and moist.  Eyes: Conjunctivae and EOM are normal. Pupils are equal, round, and reactive to light.  Neck: Normal range of motion. Neck supple. No JVD present. No tracheal deviation present.  Cardiovascular: Normal rate, regular rhythm, normal heart sounds and intact distal pulses.   Pulmonary/Chest: Effort normal and breath sounds without rales or wheezing  Abdominal: Soft. Bowel sounds are normal. NT. No HSM  Musculoskeletal: Normal range of motion. Exhibits no edema.  Lymphadenopathy:  Has no cervical adenopathy.  Neurological: Pt is alert and oriented to person, place, and time. Pt has normal reflexes. No cranial nerve deficit. Motor grossly intact Skin: Skin is warm and dry. No rash noted.  Psychiatric:  Has normal mood and affect. Behavior is normal.     Assessment & Plan:

## 2015-01-29 NOTE — Addendum Note (Signed)
Addended by: Biagio Borg on: 01/29/2015 10:48 AM   Modules accepted: Orders, Medications

## 2015-01-29 NOTE — Addendum Note (Signed)
Addended by: Valerie Salts on: 01/29/2015 10:24 AM   Modules accepted: Orders

## 2015-02-25 MED ORDER — IPRATROPIUM-ALBUTEROL 20-100 MCG/ACT IN AERS: 1.0000 | INHALATION_SPRAY | Freq: Four times a day (QID) | RESPIRATORY_TRACT | Status: DC

## 2015-02-25 NOTE — Addendum Note (Signed)
Addended by: Biagio Borg on: 02/25/2015 12:37 PM   Modules accepted: Orders

## 2015-03-04 NOTE — Telephone Encounter (Signed)
cherina to contact pt  OK to send combivent locally 30 day, but will need to know his local pharmacy (none listed on record)

## 2015-03-05 NOTE — Telephone Encounter (Signed)
Called pt no answer LMOM RTC need local pharmacy...Aaron Drake

## 2015-03-06 ENCOUNTER — Telehealth: Payer: Self-pay | Admitting: *Deleted

## 2015-03-06 ENCOUNTER — Telehealth: Payer: Self-pay | Admitting: Internal Medicine

## 2015-03-06 MED ORDER — ALBUTEROL SULFATE HFA 108 (90 BASE) MCG/ACT IN AERS: 2.0000 | INHALATION_SPRAY | Freq: Four times a day (QID) | RESPIRATORY_TRACT | Status: DC | PRN

## 2015-03-06 MED ORDER — ALBUTEROL SULFATE HFA 108 (90 BASE) MCG/ACT IN AERS: 5.0000 | INHALATION_SPRAY | Freq: Four times a day (QID) | RESPIRATORY_TRACT | Status: DC | PRN

## 2015-03-06 MED ORDER — TIOTROPIUM BROMIDE MONOHYDRATE 18 MCG IN CAPS: 18.0000 ug | ORAL_CAPSULE | Freq: Every day | RESPIRATORY_TRACT | Status: DC

## 2015-03-06 MED ORDER — IPRATROPIUM-ALBUTEROL 20-100 MCG/ACT IN AERS: 1.0000 | INHALATION_SPRAY | Freq: Four times a day (QID) | RESPIRATORY_TRACT | Status: DC

## 2015-03-06 NOTE — Telephone Encounter (Signed)
Notified pt with md response.../lmb 

## 2015-03-06 NOTE — Telephone Encounter (Signed)
Ok to try change to proair hfa, and spiriva as the combination would work in place of combivent- done erx

## 2015-03-06 NOTE — Telephone Encounter (Signed)
rx corrected 

## 2015-03-06 NOTE — Telephone Encounter (Signed)
Spoke with patient on phone. He stated [facility] [address] [facility]. The inhaler refill has been sent over for patient. Patient is aware he has an appt. At the end of the month for his other refills.

## 2015-03-06 NOTE — Telephone Encounter (Signed)
Received fax stating pls clarify sig for albuterol inhaler. Script that was receive states inhale 5 puufs into the lungs every 6 hours as needed for wheezing or shortness of breath. Pls advise if sig is correct...Aaron Drake

## 2015-03-06 NOTE — Telephone Encounter (Signed)
Patient states he received a call yesterday wanting to know what pharmacy he used to send a script over.  Did not see an note in the system of who contacted.  He was not sure of what med Dr. Jenny Reichmann was to send.  He believes it was for an inhaler.  [facility] [address].

## 2015-03-06 NOTE — Telephone Encounter (Signed)
Pt called in and stated that [facility] will not refill his Ipratropium-Albuterol [facility] RESPIMAT) 20-100 MCG/ACT AERS respimat [[id number]]  Till April 24th.  [facility] and it is over $300.00 for just one.  Can he try something else between now and April 24th  Best number [phone]

## 2015-04-14 ENCOUNTER — Encounter: Payer: Self-pay | Admitting: Internal Medicine

## 2015-05-26 ENCOUNTER — Other Ambulatory Visit: Payer: Self-pay

## 2015-07-08 ENCOUNTER — Other Ambulatory Visit: Payer: Self-pay | Admitting: Internal Medicine

## 2015-08-26 ENCOUNTER — Other Ambulatory Visit: Payer: Self-pay

## 2015-08-26 MED ORDER — ALBUTEROL SULFATE 108 (90 BASE) MCG/ACT IN AEPB: 2.0000 | INHALATION_SPRAY | RESPIRATORY_TRACT | Status: DC | PRN

## 2015-08-26 NOTE — Telephone Encounter (Signed)
PATIENT CALLED AND NEEDS A REFILL OF HIS PROAIR RESPCLICK. HIS PHARMACY IS [facility].   PLEASE ADVISE MD.  Marina Gravel

## 2015-08-27 ENCOUNTER — Telehealth: Payer: Self-pay

## 2015-08-27 ENCOUNTER — Other Ambulatory Visit: Payer: Self-pay | Admitting: *Deleted

## 2015-08-27 MED ORDER — ALBUTEROL SULFATE HFA 108 (90 BASE) MCG/ACT IN AERS: 2.0000 | INHALATION_SPRAY | RESPIRATORY_TRACT | Status: DC | PRN

## 2015-08-27 NOTE — Telephone Encounter (Signed)
Sent Proair HFA per Dr Neldon Mc rx to [facility]

## 2015-08-27 NOTE — Telephone Encounter (Signed)
Patient Called on yesterday 08/27/2015 about sending [facility] [employer] [facility]. He received an e-mail today saying his insurance does not cover the respiclick, but they cover [employer] HFA.  Please call patient if you have any questions.  Thanks!

## 2015-09-23 ENCOUNTER — Other Ambulatory Visit: Payer: Self-pay | Admitting: *Deleted

## 2015-09-23 MED ORDER — ALBUTEROL SULFATE HFA 108 (90 BASE) MCG/ACT IN AERS: 2.0000 | INHALATION_SPRAY | RESPIRATORY_TRACT | Status: DC | PRN

## 2015-11-27 ENCOUNTER — Other Ambulatory Visit: Payer: Self-pay | Admitting: Neurology

## 2015-12-10 ENCOUNTER — Other Ambulatory Visit: Payer: Self-pay

## 2015-12-10 MED ORDER — UMECLIDINIUM-VILANTEROL 62.5-25 MCG/INH IN AEPB: 1.0000 | INHALATION_SPRAY | Freq: Every day | RESPIRATORY_TRACT | Status: DC

## 2015-12-10 NOTE — Telephone Encounter (Signed)
Patient received a letter stating he needed to contact us to receive a refill on Anoro. [facility].  Please Advise  Thanks

## 2015-12-10 NOTE — Telephone Encounter (Signed)
Anoro sent to patients pharmacy and patient notified.

## 2015-12-22 ENCOUNTER — Other Ambulatory Visit: Payer: Self-pay | Admitting: Neurology

## 2015-12-22 MED ORDER — UMECLIDINIUM-VILANTEROL 62.5-25 MCG/INH IN AEPB: 1.0000 | INHALATION_SPRAY | Freq: Every day | RESPIRATORY_TRACT | Status: DC

## 2015-12-23 ENCOUNTER — Other Ambulatory Visit: Payer: Self-pay

## 2015-12-23 NOTE — Telephone Encounter (Signed)
We sent noraliptor refill and he has a respimat. pls call pharm and advise REF # [id number]

## 2015-12-24 ENCOUNTER — Other Ambulatory Visit: Payer: Self-pay | Admitting: Internal Medicine

## 2015-12-24 NOTE — Telephone Encounter (Signed)
Called number and got busy signal . Will try again later.

## 2015-12-25 NOTE — Telephone Encounter (Signed)
Spoke to patient and patient said he dose have the right inhaler and he if fine on refills. Advised patient to contact us back if any problems.

## 2016-02-03 ENCOUNTER — Other Ambulatory Visit: Payer: Self-pay

## 2016-02-03 ENCOUNTER — Telehealth: Payer: Self-pay | Admitting: Allergy and Immunology

## 2016-02-03 ENCOUNTER — Other Ambulatory Visit: Payer: Self-pay | Admitting: *Deleted

## 2016-02-03 MED ORDER — UMECLIDINIUM-VILANTEROL 62.5-25 MCG/INH IN AEPB
1.0000 | INHALATION_SPRAY | Freq: Every day | RESPIRATORY_TRACT | Status: DC
Start: 2016-02-03 — End: 2016-04-06

## 2016-02-03 MED ORDER — ALBUTEROL SULFATE HFA 108 (90 BASE) MCG/ACT IN AERS: 2.0000 | INHALATION_SPRAY | RESPIRATORY_TRACT | Status: DC | PRN

## 2016-02-03 NOTE — Telephone Encounter (Signed)
Pt called to get a refilled for 90 days on anoro inhaler. Uses  [facility].   [phone]

## 2016-02-03 NOTE — Telephone Encounter (Signed)
SENT IN 90 DAY SUPPLY BUT ALSO STATED THAT PT NEEDS AN OFFICE VISIT

## 2016-02-13 ENCOUNTER — Other Ambulatory Visit: Payer: Self-pay | Admitting: *Deleted

## 2016-02-13 ENCOUNTER — Other Ambulatory Visit: Payer: Self-pay

## 2016-02-13 MED ORDER — BECLOMETHASONE DIPROPIONATE 80 MCG/ACT IN AERS: 2.0000 | INHALATION_SPRAY | Freq: Every day | RESPIRATORY_TRACT | Status: DC

## 2016-03-20 ENCOUNTER — Other Ambulatory Visit: Payer: Self-pay | Admitting: Internal Medicine

## 2016-03-23 ENCOUNTER — Other Ambulatory Visit: Payer: Self-pay | Admitting: Internal Medicine

## 2016-04-06 ENCOUNTER — Encounter: Payer: Self-pay | Admitting: Allergy and Immunology

## 2016-04-06 ENCOUNTER — Ambulatory Visit (INDEPENDENT_AMBULATORY_CARE_PROVIDER_SITE_OTHER): Payer: 59 | Admitting: Allergy and Immunology

## 2016-04-06 VITALS — BP 124/82 | HR 76 | Resp 18 | Ht 64.96 in | Wt 199.5 lb

## 2016-04-06 DIAGNOSIS — J4540 Moderate persistent asthma, uncomplicated: Secondary | ICD-10-CM

## 2016-04-06 DIAGNOSIS — L209 Atopic dermatitis, unspecified: Secondary | ICD-10-CM

## 2016-04-06 DIAGNOSIS — H1010 Acute atopic conjunctivitis, unspecified eye: Secondary | ICD-10-CM

## 2016-04-06 DIAGNOSIS — J309 Allergic rhinitis, unspecified: Secondary | ICD-10-CM | POA: Diagnosis not present

## 2016-04-06 DIAGNOSIS — K219 Gastro-esophageal reflux disease without esophagitis: Secondary | ICD-10-CM | POA: Diagnosis not present

## 2016-04-06 MED ORDER — UMECLIDINIUM-VILANTEROL 62.5-25 MCG/INH IN AEPB: 1.0000 | INHALATION_SPRAY | Freq: Every day | RESPIRATORY_TRACT | Status: DC

## 2016-04-06 NOTE — Patient Instructions (Addendum)
  1. Continue Qvar 80 2 inhalations daily  2. Continue Anoro one inhalation daily  3. Continue montelukast 10 mg daily  4. Continue Claritin and omeprazole and ProAir HFA and topical treatment if needed  5. Return to clinic in 6 months or earlier if problem

## 2016-04-06 NOTE — Progress Notes (Signed)
Follow-up Note  Referring Provider: Biagio Borg, MD Primary Provider: Cathlean Cower, MD Date of Office Visit: 04/06/2016  Subjective:   Aaron Drake (DOB: 1955/12/23) is a 60 y.o. male who returns to the [facility] on 04/06/2016 in re-evaluation of the following:  HPI: Aaron Drake returns to this clinic in evaluation of his asthma, allergic rhinoconjunctivitis, history of atopic dermatitis, and history of reflux. I've not seen him in his clinic since August 2016.   While consistently using his Qvar and Anoro he is done very well without any significant exacerbations of his breathing problem and without the administration of any systemic steroids. He rarely uses a short acting bronchodilator. However, this February and March she did have an increased requirement for bronchodilator averaging out a few times per week but that has since resolved sometime in April.  His nose is been doing quite well without any significant problems and no episodes of sinusitis   His reflux is under excellent control and he no longer needs to use any omeprazole.  His eczema is under excellent control while using over-the-counter eczema cream and tea tree oil and he has no need for using any Locoid at this point in time.  He did obtain the flu vaccine this past winter and he is up-to-date on his Prevnar and Pneumovax and zoster vaccine     Medication List           albuterol 108 (90 Base) MCG/ACT inhaler  Commonly known as:  PROAIR HFA  Inhale 2 puffs into the lungs every 4 (four) hours as needed for wheezing or shortness of breath.     beclomethasone 80 MCG/ACT inhaler  Commonly known as:  QVAR  Inhale 2 puffs into the lungs daily.     dutasteride 0.5 MG capsule  Commonly known as:  AVODART  TAKE 1 CAPSULE DAILY     Ipratropium-Albuterol 20-100 MCG/ACT Aers respimat  Commonly known as:  COMBIVENT RESPIMAT  Inhale 1 puff into the lungs every 6 (six) hours.     loratadine 10 MG  tablet  Commonly known as:  CLARITIN  Take 10 mg by mouth daily as needed for allergies.     montelukast 10 MG tablet  Commonly known as:  SINGULAIR  Take 1 tablet (10 mg total) by mouth daily.     umeclidinium-vilanterol 62.5-25 MCG/INH Aepb  Commonly known as:  ANORO ELLIPTA  Inhale 1 puff into the lungs daily.        Past Medical History  Diagnosis Date  . Asthma 05/12/2012  . BPH (benign prostatic hyperplasia) 05/19/2012  . Allergy   . Hyperlipidemia   . Impaired glucose tolerance 05/19/2012    Past Surgical History  Procedure Laterality Date  . Cataract extraction      right    Allergies  Allergen Reactions  . Lipitor [Atorvastatin]     Review of systems negative except as noted in HPI / PMHx or noted below:  Review of Systems  Constitutional: Negative.   HENT: Negative.   Eyes: Negative.   Respiratory: Negative.   Cardiovascular: Negative.   Gastrointestinal: Negative.   Genitourinary: Negative.   Musculoskeletal: Negative.   Skin: Negative.   Neurological: Negative.   Endo/Heme/Allergies: Negative.   Psychiatric/Behavioral: Negative.      Objective:   Filed Vitals:   04/06/16 1640  BP: 124/82  Pulse: 76  Resp: 18   Height: 5' 4.96" (165 cm)  Weight: 199 lb 8.3 oz (90.5 kg)  Physical Exam  Constitutional: He is well-developed, well-nourished, and in no distress.  HENT:  Head: Normocephalic.  Right Ear: Tympanic membrane, external ear and ear canal normal.  Left Ear: Tympanic membrane, external ear and ear canal normal.  Nose: Nose normal. No mucosal edema or rhinorrhea.  Mouth/Throat: Uvula is midline, oropharynx is clear and moist and mucous membranes are normal. No oropharyngeal exudate.  Eyes: Conjunctivae are normal.  Neck: Trachea normal. No tracheal tenderness present. No tracheal deviation present. No thyromegaly present.  Cardiovascular: Normal rate, regular rhythm, S1 normal, S2 normal and normal heart sounds.   No murmur  heard. Pulmonary/Chest: Breath sounds normal. No stridor. No respiratory distress. He has no wheezes. He has no rales.  Musculoskeletal: He exhibits no edema.  Lymphadenopathy:       Head (right side): No tonsillar adenopathy present.       Head (left side): No tonsillar adenopathy present.    He has no cervical adenopathy.  Neurological: He is alert. Gait normal.  Skin: No rash noted. He is not diaphoretic. No erythema. Nails show no clubbing.  Psychiatric: Mood and affect normal.    Diagnostics:    Spirometry was performed and demonstrated an FEV1 of 2.58 at 101 % of predicted.  The patient had an Asthma Control Test with the following results: ACT Total Score: 16.    Assessment and Plan:   1. Asthma, moderate persistent, well-controlled   2. Allergic rhinoconjunctivitis   3. Atopic dermatitis   4. Gastroesophageal reflux disease, esophagitis presence not specified     1. Continue Qvar 80 2 inhalations daily  2. Continue Anoro one inhalation daily  3. Continue montelukast 10 mg daily  4. Continue Claritin and omeprazole and ProAir HFA and topical treatment if needed  5. Return to clinic in 6 months or earlier if problem  Jode is doing very well on his current plan and I see no need for changing this plan at this point. He'll continue to use anti-inflammatory medications for his respiratory tract and he certainly has the option of using Claritin and omeprazole as needed and he certainly has the option of using a topical anti-inflammatory therapy for his skin if needed I will see him back in his clinic in 6 months or earlier if there is a problem.   Allena Katz, MD [place]

## 2016-04-16 ENCOUNTER — Ambulatory Visit (INDEPENDENT_AMBULATORY_CARE_PROVIDER_SITE_OTHER): Payer: 59 | Admitting: Internal Medicine

## 2016-04-16 ENCOUNTER — Encounter: Payer: Self-pay | Admitting: Internal Medicine

## 2016-04-16 VITALS — BP 130/72 | HR 79 | Temp 99.1°F | Resp 20 | Wt 198.0 lb

## 2016-04-16 DIAGNOSIS — K429 Umbilical hernia without obstruction or gangrene: Secondary | ICD-10-CM

## 2016-04-16 DIAGNOSIS — R1033 Periumbilical pain: Secondary | ICD-10-CM | POA: Diagnosis not present

## 2016-04-16 DIAGNOSIS — E119 Type 2 diabetes mellitus without complications: Secondary | ICD-10-CM

## 2016-04-16 DIAGNOSIS — J4520 Mild intermittent asthma, uncomplicated: Secondary | ICD-10-CM | POA: Diagnosis not present

## 2016-04-16 NOTE — Progress Notes (Signed)
Subjective:    Patient ID: FURIOUS WRITER, male    DOB: 1956-11-29, 60 y.o.   MRN: [id number]  HPI  Here with intermittent periumbilical pain since dec 2017, gained some wt, belt buckle pushed on it at times, now with > 1 wk more persistent moderate pain, no n/v, Denies urinary symptoms such as dysuria, frequency, urgency, flank pain, hematuria or n/v, fever, chills.  Has had some recent constipatoin, but improved with adding fiber.  Does not  Believe constipation is related to current.  Has low grade temp, but unaware of any fever.  No recent ct ABD, no hx of surgury. Does not see GI. Pt denies chest pain, increased sob or doe, wheezing, orthopnea, PND, increased LE swelling, palpitations, dizziness or syncope.  Pt denies new neurological symptoms such as new headache, or facial or extremity weakness or numbness   Pt denies polydipsia, polyuria. Past Medical History  Diagnosis Date  . Asthma 05/12/2012  . BPH (benign prostatic hyperplasia) 05/19/2012  . Allergy   . Hyperlipidemia   . Impaired glucose tolerance 05/19/2012   Past Surgical History  Procedure Laterality Date  . Cataract extraction      right    reports that he has quit smoking. He has never used smokeless tobacco. He reports that he drinks about 0.6 oz of alcohol per week. He reports that he does not use illicit drugs. family history includes Aortic aneurysm in his father; COPD in his father; Heart disease in his mother; Stroke in his father. Allergies  Allergen Reactions  . Lipitor [Atorvastatin]    Current Outpatient Prescriptions on File Prior to Visit  Medication Sig Dispense Refill  . albuterol (PROAIR HFA) 108 (90 BASE) MCG/ACT inhaler Inhale 2 puffs into the lungs every 4 (four) hours as needed for wheezing or shortness of breath. 3 Inhaler 0  . beclomethasone (QVAR) 80 MCG/ACT inhaler Inhale 2 puffs into the lungs daily. 3 Inhaler 0  . dutasteride (AVODART) 0.5 MG capsule TAKE 1 CAPSULE DAILY 90 capsule 3  .  Ipratropium-Albuterol (COMBIVENT RESPIMAT) 20-100 MCG/ACT AERS respimat Inhale 1 puff into the lungs every 6 (six) hours. 3 Inhaler 1  . loratadine (CLARITIN) 10 MG tablet Take 10 mg by mouth daily as needed for allergies.    . montelukast (SINGULAIR) 10 MG tablet Take 1 tablet (10 mg total) by mouth daily. 90 tablet 3  . umeclidinium-vilanterol (ANORO ELLIPTA) 62.5-25 MCG/INH AEPB Inhale 1 puff into the lungs daily. 180 each 1   No current facility-administered medications on file prior to visit.   Review of Systems  Constitutional: Negative for unusual diaphoresis or night sweats HENT: Negative for ear swelling or discharge Eyes: Negative for worsening visual haziness  Respiratory: Negative for choking and stridor.   Gastrointestinal: Negative for distension or worsening eructation Genitourinary: Negative for retention or change in urine volume.  Musculoskeletal: Negative for other MSK pain or swelling Skin: Negative for color change and worsening wound Neurological: Negative for tremors and numbness other than noted  Psychiatric/Behavioral: Negative for decreased concentration or agitation other than above       Objective:   Physical Exam BP 130/72 mmHg  Pulse 79  Temp(Src) 99.1 F (37.3 C) (Oral)  Resp 20  Wt 198 lb (89.812 kg)  SpO2 96% VS noted,  Constitutional: Pt appears in no apparent distress HENT: Head: NCAT.  Right Ear: External ear normal.  Left Ear: External ear normal.  Eyes: . Pupils are equal, round, and reactive to light. Conjunctivae and  EOM are normal Neck: Normal range of motion. Neck supple.  Cardiovascular: Normal rate and regular rhythm.   Pulmonary/Chest: Effort normal and breath sounds without rales or wheezing.  Abd:  Soft, NT, ND, + BS, no umbilical hernia tender , swelling and reducible Neurological: Pt is alert. Not confused , motor grossly intact Skin: Skin is warm. No rash, no LE edema Psychiatric: Pt behavior is normal. No agitation.        Assessment & Plan:

## 2016-04-16 NOTE — Progress Notes (Signed)
Pre visit review using our clinic review tool, if applicable. No additional management support is needed unless otherwise documented below in the visit note. 

## 2016-04-16 NOTE — Patient Instructions (Addendum)
Please continue all other medications as before, and refills have been done if requested.  Please have the pharmacy call with any other refills you may need.  Please keep your appointments with your specialists as you may have planned  You will be contacted regarding the referral for: General Surgury 

## 2016-04-17 DIAGNOSIS — R1033 Periumbilical pain: Secondary | ICD-10-CM | POA: Insufficient documentation

## 2016-04-17 NOTE — Assessment & Plan Note (Signed)
stable overall by history and exam, recent data reviewed with pt, and pt to continue medical treatment as before,  to f/u any worsening symptoms or concerns ble Lab Results  Component Value Date   HGBA1C 7.8* 01/11/2014

## 2016-04-17 NOTE — Assessment & Plan Note (Signed)
C/w possible increased symptomatic umbilical hernia, exam today benign, for gen surgury referral, hold on imaging or labs at this time

## 2016-04-17 NOTE — Assessment & Plan Note (Signed)
stable overall by history and exam, recent data reviewed with pt, and pt to continue medical treatment as before,  to f/u any worsening symptoms or concerns SpO2 Readings from Last 3 Encounters:  04/16/16 96%  01/29/15 95%  04/01/14 96%

## 2016-04-23 ENCOUNTER — Other Ambulatory Visit: Payer: Self-pay | Admitting: Internal Medicine

## 2016-04-23 ENCOUNTER — Telehealth: Payer: Self-pay

## 2016-04-23 NOTE — Telephone Encounter (Signed)
Faxed 90-day supply of ProAir HFA (3 inhalers and 0 refills) to [facility] at [phone].

## 2016-05-13 ENCOUNTER — Other Ambulatory Visit: Payer: Self-pay | Admitting: General Surgery

## 2016-05-13 NOTE — Pre-Procedure Instructions (Signed)
Aaron Drake  05/13/2016     Your procedure is scheduled on : Monday May 17, 2016 at 7:30 AM.  Report to [facility] Admitting at 5:30 AM.  Call this number if you have problems the morning of surgery: [phone]    Remember:  Do not eat food or drink liquids after midnight.  Take these medicines the morning of surgery with A SIP OF WATER : Albuterol inhaler if needed, Qvar inhaler, Combivent inhaler if needed, Loratadine (Claritin), Montelukast (Singulair), Anoro inhaler, Avodart   Stop taking any vitamins, herbal medications/supplements, NSAIDs, Ibuprofen, Advil, Motrin, Aleve/Naproxen, etc today   Do not wear jewelry.  Do not wear lotions, powders, or cologne.    Men may shave face and neck.  Do not bring valuables to the hospital.  [facility] is not responsible for any belongings or valuables.  Contacts, dentures or bridgework may not be worn into surgery.  Leave your suitcase in the car.  After surgery it may be brought to your room.  For patients admitted to the hospital, discharge time will be determined by your treatment team.  Patients discharged the day of surgery will not be allowed to drive home.   Name and phone number of your driver:    Special instructions:  Shower using CHG soap the night before and the morning of your surgery  Please read over the following fact sheets that you were given. Pain Booklet, Coughing and Deep Breathing and Surgical Site Infection Prevention

## 2016-05-14 ENCOUNTER — Encounter (HOSPITAL_COMMUNITY)
Admission: RE | Admit: 2016-05-14 | Discharge: 2016-05-14 | Disposition: A | Discharge status: Home / Self Care | Payer: 59 | Source: Ambulatory Visit | Attending: General Surgery | Admitting: General Surgery

## 2016-05-14 ENCOUNTER — Encounter (HOSPITAL_COMMUNITY): Payer: Self-pay

## 2016-05-14 DIAGNOSIS — Z6833 Body mass index (BMI) 33.0-33.9, adult: Secondary | ICD-10-CM | POA: Diagnosis not present

## 2016-05-14 DIAGNOSIS — J45909 Unspecified asthma, uncomplicated: Secondary | ICD-10-CM | POA: Diagnosis not present

## 2016-05-14 DIAGNOSIS — Z87891 Personal history of nicotine dependence: Secondary | ICD-10-CM | POA: Diagnosis not present

## 2016-05-14 DIAGNOSIS — K429 Umbilical hernia without obstruction or gangrene: Secondary | ICD-10-CM | POA: Diagnosis not present

## 2016-05-14 HISTORY — DX: Unspecified osteoarthritis, unspecified site: M19.90

## 2016-05-14 HISTORY — DX: Personal history of urinary calculi: Z87.442

## 2016-05-14 LAB — CBC WITH DIFFERENTIAL/PLATELET
BASOS ABS: 0 10*3/uL (ref 0.0–0.1)
Basophils Relative: 1 %
EOS ABS: 0.5 10*3/uL (ref 0.0–0.7)
EOS PCT: 5 %
HCT: 43.1 % (ref 39.0–52.0)
Hemoglobin: 14.2 g/dL (ref 13.0–17.0)
LYMPHS ABS: 2.8 10*3/uL (ref 0.7–4.0)
LYMPHS PCT: 32 %
MCH: 28.7 pg (ref 26.0–34.0)
MCHC: 32.9 g/dL (ref 30.0–36.0)
MCV: 87.1 fL (ref 78.0–100.0)
MONO ABS: 0.7 10*3/uL (ref 0.1–1.0)
Monocytes Relative: 8 %
Neutro Abs: 4.7 10*3/uL (ref 1.7–7.7)
Neutrophils Relative %: 54 %
PLATELETS: 259 10*3/uL (ref 150–400)
RBC: 4.95 MIL/uL (ref 4.22–5.81)
RDW: 13.5 % (ref 11.5–15.5)
WBC: 8.8 10*3/uL (ref 4.0–10.5)

## 2016-05-14 LAB — BASIC METABOLIC PANEL
Anion gap: 7 (ref 5–15)
BUN: 14 mg/dL (ref 6–20)
CO2: 24 mmol/L (ref 22–32)
CREATININE: 0.81 mg/dL (ref 0.61–1.24)
Calcium: 9.9 mg/dL (ref 8.9–10.3)
Chloride: 107 mmol/L (ref 101–111)
GFR calc Af Amer: >60 mL/min (ref >60)
GLUCOSE: 98 mg/dL (ref 65–99)
POTASSIUM: 4 mmol/L (ref 3.5–5.1)
SODIUM: 138 mmol/L (ref 135–145)

## 2016-05-14 NOTE — Progress Notes (Signed)
PCP is Cathlean Cower  Patient denied having any acute cardiac issues, but did inform Nurse that he has asthma and uses his inhalers daily. Nurse instructed patient to bring Proair inhaler with him DOS, and patient stated "I never leave home with out it."

## 2016-05-17 ENCOUNTER — Ambulatory Visit (HOSPITAL_COMMUNITY)
Admission: RE | Admit: 2016-05-17 | Discharge: 2016-05-17 | Disposition: A | Discharge status: Home / Self Care | Payer: 59 | Source: Ambulatory Visit | Attending: General Surgery | Admitting: General Surgery

## 2016-05-17 ENCOUNTER — Encounter (HOSPITAL_COMMUNITY)
Admission: RE | Disposition: A | Discharge status: Home / Self Care | Payer: Self-pay | Source: Ambulatory Visit | Attending: General Surgery

## 2016-05-17 ENCOUNTER — Encounter (HOSPITAL_COMMUNITY): Payer: Self-pay | Admitting: *Deleted

## 2016-05-17 ENCOUNTER — Ambulatory Visit (HOSPITAL_COMMUNITY): Payer: 59 | Admitting: Certified Registered"

## 2016-05-17 DIAGNOSIS — K429 Umbilical hernia without obstruction or gangrene: Secondary | ICD-10-CM | POA: Diagnosis not present

## 2016-05-17 DIAGNOSIS — J45909 Unspecified asthma, uncomplicated: Secondary | ICD-10-CM | POA: Insufficient documentation

## 2016-05-17 DIAGNOSIS — Z6833 Body mass index (BMI) 33.0-33.9, adult: Secondary | ICD-10-CM | POA: Insufficient documentation

## 2016-05-17 DIAGNOSIS — Z87891 Personal history of nicotine dependence: Secondary | ICD-10-CM | POA: Insufficient documentation

## 2016-05-17 HISTORY — PX: INSERTION OF MESH: SHX5868

## 2016-05-17 HISTORY — PX: UMBILICAL HERNIA REPAIR: SHX196

## 2016-05-17 SURGERY — REPAIR, HERNIA, UMBILICAL, ADULT
Anesthesia: General | Site: Abdomen

## 2016-05-17 MED ORDER — BUPIVACAINE-EPINEPHRINE (PF) 0.25% -1:200000 IJ SOLN
INTRAMUSCULAR | Status: AC
  Filled 2016-05-17: qty 30

## 2016-05-17 MED ORDER — HYDROCODONE-ACETAMINOPHEN 5-325 MG PO TABS
ORAL_TABLET | ORAL | Status: DC
Start: 2016-05-17 — End: 2016-05-17
  Filled 2016-05-17: qty 2

## 2016-05-17 MED ORDER — CEFAZOLIN SODIUM-DEXTROSE 2-4 GM/100ML-% IV SOLN
2.0000 g | INTRAVENOUS | Status: AC
  Administered 2016-05-17: 2 g via INTRAVENOUS
  Filled 2016-05-17: qty 100

## 2016-05-17 MED ORDER — OXYCODONE HCL 5 MG PO TABS: 5.0000 mg | ORAL_TABLET | Freq: Once | ORAL | Status: DC | PRN

## 2016-05-17 MED ORDER — ACETAMINOPHEN 160 MG/5ML PO SOLN
325.0000 mg | ORAL | Status: DC | PRN
  Filled 2016-05-17: qty 20.3

## 2016-05-17 MED ORDER — HYDROCODONE-ACETAMINOPHEN 10-325 MG PO TABS: 1.0000 | ORAL_TABLET | Freq: Four times a day (QID) | ORAL | Status: DC | PRN

## 2016-05-17 MED ORDER — LIDOCAINE HCL (CARDIAC) 20 MG/ML IV SOLN
INTRAVENOUS | Status: DC | PRN
  Administered 2016-05-17: 50 mg via INTRAVENOUS

## 2016-05-17 MED ORDER — MIDAZOLAM HCL 2 MG/2ML IJ SOLN
INTRAMUSCULAR | Status: AC
  Filled 2016-05-17: qty 2

## 2016-05-17 MED ORDER — MORPHINE SULFATE (PF) 2 MG/ML IV SOLN: 2.0000 mg | INTRAVENOUS | Status: DC | PRN

## 2016-05-17 MED ORDER — SUGAMMADEX SODIUM 200 MG/2ML IV SOLN
INTRAVENOUS | Status: DC | PRN
  Administered 2016-05-17: 200 mg via INTRAVENOUS

## 2016-05-17 MED ORDER — 0.9 % SODIUM CHLORIDE (POUR BTL) OPTIME
TOPICAL | Status: DC | PRN
  Administered 2016-05-17: 1000 mL

## 2016-05-17 MED ORDER — OXYCODONE HCL 5 MG PO TABS: 5.0000 mg | ORAL_TABLET | ORAL | Status: DC | PRN

## 2016-05-17 MED ORDER — PROPOFOL 10 MG/ML IV BOLUS
INTRAVENOUS | Status: AC
  Filled 2016-05-17: qty 20

## 2016-05-17 MED ORDER — HYDROMORPHONE HCL 1 MG/ML IJ SOLN: 0.2500 mg | INTRAMUSCULAR | Status: DC | PRN

## 2016-05-17 MED ORDER — MIDAZOLAM HCL 5 MG/5ML IJ SOLN
INTRAMUSCULAR | Status: DC | PRN
  Administered 2016-05-17: 2 mg via INTRAVENOUS

## 2016-05-17 MED ORDER — ONDANSETRON HCL 4 MG/2ML IJ SOLN
INTRAMUSCULAR | Status: AC
  Filled 2016-05-17: qty 2

## 2016-05-17 MED ORDER — HYDROCODONE-ACETAMINOPHEN 5-325 MG PO TABS
2.0000 | ORAL_TABLET | ORAL | Status: AC
  Administered 2016-05-17: 2 via ORAL

## 2016-05-17 MED ORDER — FENTANYL CITRATE (PF) 100 MCG/2ML IJ SOLN
INTRAMUSCULAR | Status: DC | PRN
  Administered 2016-05-17: 150 ug via INTRAVENOUS

## 2016-05-17 MED ORDER — FENTANYL CITRATE (PF) 250 MCG/5ML IJ SOLN
INTRAMUSCULAR | Status: AC
  Filled 2016-05-17: qty 5

## 2016-05-17 MED ORDER — ROCURONIUM BROMIDE 50 MG/5ML IV SOLN
INTRAVENOUS | Status: AC
  Filled 2016-05-17: qty 1

## 2016-05-17 MED ORDER — PHENYLEPHRINE 40 MCG/ML (10ML) SYRINGE FOR IV PUSH (FOR BLOOD PRESSURE SUPPORT)
PREFILLED_SYRINGE | INTRAVENOUS | Status: AC
  Filled 2016-05-17: qty 10

## 2016-05-17 MED ORDER — PROPOFOL 10 MG/ML IV BOLUS
INTRAVENOUS | Status: DC | PRN
  Administered 2016-05-17: 140 mg via INTRAVENOUS

## 2016-05-17 MED ORDER — LACTATED RINGERS IV SOLN
INTRAVENOUS | Status: DC | PRN
  Administered 2016-05-17: 07:00:00 via INTRAVENOUS

## 2016-05-17 MED ORDER — ROCURONIUM BROMIDE 100 MG/10ML IV SOLN
INTRAVENOUS | Status: DC | PRN
  Administered 2016-05-17: 40 mg via INTRAVENOUS

## 2016-05-17 MED ORDER — SUGAMMADEX SODIUM 200 MG/2ML IV SOLN
INTRAVENOUS | Status: AC
  Filled 2016-05-17: qty 2

## 2016-05-17 MED ORDER — SODIUM CHLORIDE 0.9 % IV SOLN: INTRAVENOUS | Status: DC

## 2016-05-17 MED ORDER — ACETAMINOPHEN 325 MG PO TABS: 325.0000 mg | ORAL_TABLET | ORAL | Status: DC | PRN

## 2016-05-17 MED ORDER — BUPIVACAINE-EPINEPHRINE 0.25% -1:200000 IJ SOLN
INTRAMUSCULAR | Status: DC | PRN
  Administered 2016-05-17: 10 mL

## 2016-05-17 MED ORDER — OXYCODONE HCL 5 MG/5ML PO SOLN: 5.0000 mg | Freq: Once | ORAL | Status: DC | PRN

## 2016-05-17 SURGICAL SUPPLY — 48 items
BLADE SURG 15 STRL LF DISP TIS (BLADE) ×1 IMPLANT
BLADE SURG 15 STRL SS (BLADE) ×2
BLADE SURG ROTATE 9660 (MISCELLANEOUS) IMPLANT
CANISTER SUCTION 2500CC (MISCELLANEOUS) IMPLANT
CHLORAPREP W/TINT 26ML (MISCELLANEOUS) ×2 IMPLANT
COVER SURGICAL LIGHT HANDLE (MISCELLANEOUS) ×2 IMPLANT
DECANTER SPIKE VIAL GLASS SM (MISCELLANEOUS) ×1 IMPLANT
DRAPE LAPAROSCOPIC ABDOMINAL (DRAPES) ×2 IMPLANT
DRAPE UTILITY XL STRL (DRAPES) ×2 IMPLANT
ELECT CAUTERY BLADE 6.4 (BLADE) ×2 IMPLANT
ELECT REM PT RETURN 9FT ADLT (ELECTROSURGICAL) ×2
ELECTRODE REM PT RTRN 9FT ADLT (ELECTROSURGICAL) ×1 IMPLANT
GAUZE SPONGE 4X4 16PLY XRAY LF (GAUZE/BANDAGES/DRESSINGS) ×2 IMPLANT
GLOVE BIO SURGEON STRL SZ 6 (GLOVE) ×1 IMPLANT
GLOVE BIO SURGEON STRL SZ7 (GLOVE) ×3 IMPLANT
GLOVE BIOGEL PI IND STRL 6.5 (GLOVE) IMPLANT
GLOVE BIOGEL PI IND STRL 7.0 (GLOVE) IMPLANT
GLOVE BIOGEL PI IND STRL 7.5 (GLOVE) ×1 IMPLANT
GLOVE BIOGEL PI INDICATOR 6.5 (GLOVE) ×1
GLOVE BIOGEL PI INDICATOR 7.0 (GLOVE) ×2
GLOVE BIOGEL PI INDICATOR 7.5 (GLOVE) ×1
GLOVE SURG SS PI 7.0 STRL IVOR (GLOVE) ×1 IMPLANT
GOWN STRL REUS W/ TWL LRG LVL3 (GOWN DISPOSABLE) ×2 IMPLANT
GOWN STRL REUS W/TWL LRG LVL3 (GOWN DISPOSABLE) ×8
KIT BASIN OR (CUSTOM PROCEDURE TRAY) ×2 IMPLANT
KIT ROOM TURNOVER OR (KITS) ×2 IMPLANT
LIQUID BAND (GAUZE/BANDAGES/DRESSINGS) ×2 IMPLANT
MESH VENTRALEX ST 2.5 CRC MED (Mesh General) ×1 IMPLANT
NDL HYPO 25GX1X1/2 BEV (NEEDLE) ×1 IMPLANT
NEEDLE HYPO 25GX1X1/2 BEV (NEEDLE) ×2 IMPLANT
NS IRRIG 1000ML POUR BTL (IV SOLUTION) ×2 IMPLANT
PACK SURGICAL SETUP 50X90 (CUSTOM PROCEDURE TRAY) ×2 IMPLANT
PAD ARMBOARD 7.5X6 YLW CONV (MISCELLANEOUS) ×2 IMPLANT
PENCIL BUTTON HOLSTER BLD 10FT (ELECTRODE) ×2 IMPLANT
STRIP CLOSURE SKIN 1/2X4 (GAUZE/BANDAGES/DRESSINGS) ×2 IMPLANT
SUT ETHIBOND 0 MO6 C/R (SUTURE) ×2 IMPLANT
SUT MNCRL AB 4-0 PS2 18 (SUTURE) ×2 IMPLANT
SUT PROLENE 0 CT 1 CR/8 (SUTURE) IMPLANT
SUT VIC AB 2-0 CT1 27 (SUTURE) ×2
SUT VIC AB 2-0 CT1 TAPERPNT 27 (SUTURE) ×1 IMPLANT
SUT VIC AB 3-0 SH 27 (SUTURE) ×2
SUT VIC AB 3-0 SH 27X BRD (SUTURE) ×1 IMPLANT
SUT VICRYL AB 2 0 TIES (SUTURE) ×2 IMPLANT
SYR CONTROL 10ML LL (SYRINGE) ×2 IMPLANT
TOWEL OR 17X24 6PK STRL BLUE (TOWEL DISPOSABLE) ×2 IMPLANT
TOWEL OR 17X26 10 PK STRL BLUE (TOWEL DISPOSABLE) ×1 IMPLANT
TUBE CONNECTING 12X1/4 (SUCTIONS) IMPLANT
YANKAUER SUCT BULB TIP NO VENT (SUCTIONS) IMPLANT

## 2016-05-17 NOTE — Anesthesia Postprocedure Evaluation (Signed)
Anesthesia Post Note  Patient: Aaron Drake  Procedure(s) Performed: Procedure(s) (LRB): UMBILICAL HERNIA REPAIR (N/A) INSERTION OF MESH (N/A)  Patient location during evaluation: PACU Anesthesia Type: General Level of consciousness: awake Pain management: pain level controlled Vital Signs Assessment: post-procedure vital signs reviewed and stable Respiratory status: spontaneous breathing Cardiovascular status: stable Postop Assessment: no signs of nausea or vomiting Anesthetic complications: no    Last Vitals:  Filed Vitals:   05/17/16 0945 05/17/16 1021  BP:  100/60  Pulse: 57 71  Temp:    Resp: 15     Last Pain:  Filed Vitals:   05/17/16 1024  PainSc: 0-No pain                 Zaim Nitta

## 2016-05-17 NOTE — Interval H&P Note (Signed)
History and Physical Interval Note:  05/17/2016 7:09 AM  Aaron Drake  has presented today for surgery, with the diagnosis of Umbilical hernia   The various methods of treatment have been discussed with the patient and family. After consideration of risks, benefits and other options for treatment, the patient has consented to  Procedure(s): OPEN HERNIA REPAIR UMBILICAL ADULT WITH MESH  (N/A) INSERTION OF MESH (N/A) as a surgical intervention .  [name] history has been reviewed, patient examined, no change in status, stable for surgery.  I have reviewed the patient's chart and labs.  Questions were answered to the patient's satisfaction.     [name]

## 2016-05-17 NOTE — Transfer of Care (Signed)
Immediate Anesthesia Transfer of Care Note  Patient: Aaron Drake  Procedure(s) Performed: Procedure(s): UMBILICAL HERNIA REPAIR (N/A) INSERTION OF MESH (N/A)  Patient Location: PACU  Anesthesia Type:General  Level of Consciousness: awake, alert  and oriented  Airway & Oxygen Therapy: Patient connected to face mask oxygen  Post-op Assessment: Post -op Vital signs reviewed and stable  Post vital signs: stable  Last Vitals:  Filed Vitals:   05/17/16 0548  BP: 156/76  Pulse: 85  Temp: 37.1 C  Resp: 18    Last Pain: There were no vitals filed for this visit.    Patients Stated Pain Goal: 4 (0000000 [id number])  Complications: No apparent anesthesia complications

## 2016-05-17 NOTE — H&P (Signed)
   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 this is a little increased in size. he has no history of abdominal surgery.    Other Problems Illene Regulus, CMA; 04/21/2016 1:32 PM) Asthma Hemorrhoids Umbilical Hernia Repair  Past Surgical History Lars Mage Spillers, CMA; 04/21/2016 1:32 PM) Cataract Surgery Right. Oral Surgery  Diagnostic Studies History Illene Regulus, CMA; 04/21/2016 1:32 PM) Colonoscopy never  Allergies (Alisha Spillers, CMA; 04/21/2016 1:33 PM) No Known Drug Allergies05/24/2017  Medication History (Alisha Spillers, CMA; 04/21/2016 1:38 PM) Loratadine (10MG  Tablet, Oral) Active. Umeclidinium Bromide (62.5MCG/INH Aero Pow Br Act, Inhalation) Active. Beclomethasone Diprop (Nasal) (42MCG/ACT Aerosol Soln, Nasal) Active. Albuterol (90MCG/ACT Aerosol Soln, Inhalation) Active. Dutasteride (0.5MG  Capsule, Oral) Active. Singulair (10MG  Tablet, Oral) Active. Medications Reconciled  Social History Illene Regulus, CMA; 04/21/2016 1:32 PM) Alcohol use Occasional alcohol use. Caffeine use Coffee. Tobacco use Former smoker.  Family History Illene Regulus, CMA; 04/21/2016 1:32 PM) Alcohol Abuse Father. Cancer Mother. Cerebrovascular Accident Father, Mother. Heart Disease Mother. Hypertension Father, Mother. Respiratory Condition Father.    Review of Systems Lars Mage Spillers CMA; 04/21/2016 1:32 PM) General Not Present- Appetite Loss, Chills, Fatigue, Fever, Night Sweats, Weight Gain and Weight Loss. Skin Not Present- Change in Wart/Mole, Dryness, Hives, Jaundice, New Lesions, Non-Healing Wounds, Rash and Ulcer. HEENT Present- Ringing in the Ears, Seasonal Allergies and Wears glasses/contact lenses. Not Present- Earache, Hearing Loss, Hoarseness, Nose Bleed, Oral Ulcers, Sinus Pain, Sore Throat, Visual Disturbances and Yellow Eyes. Respiratory Not Present- Bloody  sputum, Chronic Cough, Difficulty Breathing, Snoring and Wheezing. Breast Not Present- Breast Mass, Breast Pain, Nipple Discharge and Skin Changes. Cardiovascular Not Present- Chest Pain, Difficulty Breathing Lying Down, Leg Cramps, Palpitations, Rapid Heart Rate, Shortness of Breath and Swelling of Extremities. Gastrointestinal Present- Hemorrhoids. Not Present- Abdominal Pain, Bloating, Bloody Stool, Change in Bowel Habits, Chronic diarrhea, Constipation, Difficulty Swallowing, Excessive gas, Gets full quickly at meals, Indigestion, Nausea, Rectal Pain and Vomiting. Male Genitourinary Not Present- Blood in Urine, Change in Urinary Stream, Frequency, Impotence, Nocturia, Painful Urination, Urgency and Urine Leakage. Neurological Not Present- Decreased Memory, Fainting, Headaches, Numbness, Seizures, Tingling, Tremor, Trouble walking and Weakness. Psychiatric Not Present- Anxiety, Bipolar, Change in Sleep Pattern, Depression, Fearful and Frequent crying. Endocrine Not Present- Cold Intolerance, Excessive Hunger, Hair Changes, Heat Intolerance, Hot flashes and New Diabetes. Hematology Not Present- Easy Bruising, Excessive bleeding, Gland problems, HIV and Persistent Infections.  Vitals (Alisha Spillers CMA; 04/21/2016 1:33 PM) 04/21/2016 1:33 PM Weight: 193 lb Height: 64in Body Surface Area: 1.93 m Body Mass Index: 33.13 kg/m  Pulse: 48 (Regular)  BP: 120/74 (Sitting, Left Arm, Standard)       Physical Exam Rolm Bookbinder MD; 04/21/2016 2:00 PM) General Mental Status-Alert. Orientation-Oriented X3.  Chest and Lung Exam Chest and lung exam reveals -on auscultation, normal breath sounds, no adventitious sounds and normal vocal resonance.  Cardiovascular Cardiovascular examination reveals -normal heart sounds, regular rate and rhythm with no murmurs.  Abdomen Note: soft nontender, about 1 cm reducible umbilical hernia     Assessment & Plan Rolm Bookbinder  MD; [id number] PM) UMBILICAL HERNIA ([id number]) Story: open umbilical hernia repair with mesh I discussed laparoscopic versus open repair. I do think he should have this repaired and due to habitus and size should be with mesh. I think open repair would be simpler for him. we discussed surgery, risks and recovery. will schedule on a monday per his request

## 2016-05-17 NOTE — Op Note (Signed)
Preoperative diagnosis: symptomatic umbilical hernia Postoperative diagnosis: same as above Procedure: open umbilical hernia repair with 6.4 cm ventralight mesh Surgeon: Dr Serita Grammes Anesthesia: general EBL: minimal Drains none Specimen none Complications: none Sponge count correct at completion Disposition to recovery stable  Indications: This is a 89 yom who has a symptomatic umbilical hernia. We discussed open repair with mesh.    Procedure: After informed consent was obtained the patient was taken to the operating room. He was given antibiotics. Sequential compression devices were on his legs. He was placed under general anesthesia without complication. His abdomen was prepped and draped in the standard sterile surgical fashion. A surgical timeout was then performed. I infiltrated marcaine under the umbilicus. I made a curvilinear incision below the umbilicus.  I encircled the umbilical stalk. I then divided this. There was preperitoneal fat incarcerated.  He had no real preperitoneal plane and I was intraperitoneal after reducing the hernia. The defect as about 2 cm with attenuated fascia. I elected to place mesh. I placed a 6.4 cm ventralex mesh and pulled this up to be against the abdominal wall. It appeared in good position. I then sutured the mesh with #1 ethibond to the fascia and closed the fascia. I then pulled the straps tight and sewed these in place with ethibond suture as well.  This obliterated the defect.  I then obtained hemostasis.  I tacked the umbilicus down with 3-0 vicryl.  I then closed the incision with 4-0 Monocryl and Dermabond. He tolerated this well was extubated and transferred to the recovery room in stable condition

## 2016-05-17 NOTE — Discharge Instructions (Signed)
[facility] ° °UMBILICAL OR INGUINAL HERNIA REPAIR: POST OP INSTRUCTIONS ° °Always review your discharge instruction sheet given to you by the facility where your surgery was performed. °IF YOU HAVE DISABILITY OR FAMILY LEAVE FORMS, YOU MUST BRING THEM TO THE OFFICE FOR PROCESSING.   °DO NOT GIVE THEM TO YOUR DOCTOR. ° °1. A  prescription for pain medication may be given to you upon discharge.  Take your pain medication as prescribed, if needed.  If narcotic pain medicine is not needed, then you may take acetaminophen (Tylenol), naprosyn (Alleve) or ibuprofen (Advil) as needed. °2. Take your usually prescribed medications unless otherwise directed. °3. If you need a refill on your pain medication, please contact your pharmacy.  They will contact our office to request authorization. Prescriptions will not be filled after 5 pm or on week-ends. °4. You should follow a light diet the first 24 hours after arrival home, such as soup and crackers, etc.  Be sure to include lots of fluids daily.  Resume your normal diet the day after surgery. °5. Most patients will experience some swelling and bruising around the umbilicus or in the groin and scrotum.  Ice packs and reclining will help.  Swelling and bruising can take several days to resolve.  °6. It is common to experience some constipation if taking pain medication after surgery.  Increasing fluid intake and taking a stool softener (such as Colace) will usually help or prevent this problem from occurring.  A mild laxative (Milk of Magnesia or Miralax) should be taken according to package directions if there are no bowel movements after 48 hours. °7. Unless discharge instructions indicate otherwise, you may remove your bandages 48 hours after surgery, and you may shower at that time.  You may have steri-strips (small skin tapes) in place directly over the incision.  These strips should be left on the skin for 7-10 days and will come off on their own.   If your surgeon used skin glue on the incision, you may shower in 24 hours.  The glue will flake off over the next 2-3 weeks.  Any sutures or staples will be removed at the office during your follow-up visit. °8. ACTIVITIES:  You may resume regular (light) daily activities beginning the next day--such as daily self-care, walking, climbing stairs--gradually increasing activities as tolerated.  You may have sexual intercourse when it is comfortable.  Refrain from any heavy lifting or straining until approved by your doctor. °a. You may drive when you are no longer taking prescription pain medication, you can comfortably wear a seatbelt, and you can safely maneuver your car and apply brakes. °b. RETURN TO WORK:  __________________________________________________________ °9. You should see your doctor in the office for a follow-up appointment approximately 2-3 weeks after your surgery.  Make sure that you call for this appointment within a day or two after you arrive home to insure a convenient appointment time. °10. OTHER INSTRUCTIONS:  __________________________________________________________________________________________________________________________________________________________________________________________  °WHEN TO CALL YOUR DOCTOR: °1. Fever over 101.0 °2. Inability to urinate °3. Nausea and/or vomiting °4. Extreme swelling or bruising °5. Continued bleeding from incision. °6. Increased pain, redness, or drainage from the incision ° °The clinic staff is available to answer your questions during regular business hours.  Please don’t hesitate to call and ask to speak to one of the nurses for clinical concerns.  If you have a medical emergency, go to the nearest emergency room or call 911.  A surgeon from [facility]   is always on call at the hospital ° ° °[address], [address], [place], [place]  [address] ? ° [address] [address], [place], [place]   [address] °[phone] ? [phone] ? FAX  [phone] °Web site: [email or web address] ° ° °

## 2016-05-17 NOTE — Anesthesia Preprocedure Evaluation (Signed)
Anesthesia Evaluation  Patient identified by MRN, date of birth, ID band Patient awake    Reviewed: Allergy & Precautions, NPO status , Patient's Chart, lab work & pertinent test results  History of Anesthesia Complications Negative for: history of anesthetic complications  Airway Mallampati: III  TM Distance: >3 FB Neck ROM: Full    Dental  (+) Teeth Intact,    Pulmonary neg shortness of breath, asthma , neg sleep apnea, neg recent URI, former smoker,    breath sounds clear to auscultation       Cardiovascular negative cardio ROS   Rhythm:Regular     Neuro/Psych negative neurological ROS  negative psych ROS   GI/Hepatic negative GI ROS, Neg liver ROS,   Endo/Other  Morbid obesity  Renal/GU negative Renal ROS     Musculoskeletal  (+) Arthritis ,   Abdominal   Peds  Hematology negative hematology ROS (+)   Anesthesia Other Findings   Reproductive/Obstetrics                             Anesthesia Physical Anesthesia Plan  ASA: II  Anesthesia Plan: General   Post-op Pain Management:    Induction: Intravenous  Airway Management Planned: Oral ETT  Additional Equipment: None  Intra-op Plan:   Post-operative Plan: Extubation in OR  Informed Consent: I have reviewed the patients History and Physical, chart, labs and discussed the procedure including the risks, benefits and alternatives for the proposed anesthesia with the patient or authorized representative who has indicated his/her understanding and acceptance.   Dental advisory given  Plan Discussed with: Surgeon and CRNA  Anesthesia Plan Comments:         Anesthesia Quick Evaluation

## 2016-05-17 NOTE — Anesthesia Procedure Notes (Signed)
Procedure Name: Intubation Date/Time: 05/17/2016 7:42 AM Performed by: Lavell Luster Pre-anesthesia Checklist: Patient identified, Emergency Drugs available, Suction available, Patient being monitored and Timeout performed Patient Re-evaluated:Patient Re-evaluated prior to inductionOxygen Delivery Method: Circle system utilized Preoxygenation: Pre-oxygenation with 100% oxygen Intubation Type: IV induction Laryngoscope Size: Mac and 3 Grade View: Grade III Tube type: Oral Tube size: 7.5 mm Number of attempts: 1 Airway Equipment and Method: Stylet Placement Confirmation: ETT inserted through vocal cords under direct vision,  positive ETCO2 and breath sounds checked- equal and bilateral Secured at: 22 cm Tube secured with: Tape Dental Injury: Teeth and Oropharynx as per pre-operative assessment  Difficulty Due To: Difficult Airway- due to anterior larynx and Difficult Airway- due to limited oral opening

## 2016-05-18 ENCOUNTER — Encounter (HOSPITAL_COMMUNITY): Payer: Self-pay | Admitting: General Surgery

## 2016-05-18 NOTE — OR Nursing (Signed)
Addendum created to document Recovery Care complete time

## 2016-07-04 ENCOUNTER — Other Ambulatory Visit: Payer: Self-pay | Admitting: Internal Medicine

## 2016-07-28 ENCOUNTER — Encounter (HOSPITAL_COMMUNITY): Payer: Self-pay

## 2016-08-11 ENCOUNTER — Other Ambulatory Visit: Payer: Self-pay | Admitting: *Deleted

## 2016-08-11 NOTE — Telephone Encounter (Signed)
Denied Proair hfa to [facility] service pharmacy faxed it [phone] using too much

## 2016-09-17 ENCOUNTER — Other Ambulatory Visit: Payer: Self-pay | Admitting: Internal Medicine

## 2016-10-18 ENCOUNTER — Other Ambulatory Visit: Payer: Self-pay | Admitting: *Deleted

## 2016-10-18 MED ORDER — UMECLIDINIUM-VILANTEROL 62.5-25 MCG/INH IN AEPB: 1.0000 | INHALATION_SPRAY | Freq: Every day | RESPIRATORY_TRACT | 0 refills | Status: DC

## 2016-10-27 ENCOUNTER — Encounter: Payer: Self-pay | Admitting: Internal Medicine

## 2016-11-05 ENCOUNTER — Other Ambulatory Visit: Payer: Self-pay | Admitting: *Deleted

## 2016-11-05 ENCOUNTER — Telehealth: Payer: Self-pay | Admitting: Allergy and Immunology

## 2016-11-05 MED ORDER — UMECLIDINIUM-VILANTEROL 62.5-25 MCG/INH IN AEPB: 1.0000 | INHALATION_SPRAY | Freq: Every day | RESPIRATORY_TRACT | 0 refills | Status: DC

## 2016-11-11 ENCOUNTER — Encounter: Payer: Self-pay | Admitting: Internal Medicine

## 2016-11-11 ENCOUNTER — Telehealth: Payer: Self-pay

## 2016-11-11 ENCOUNTER — Ambulatory Visit (INDEPENDENT_AMBULATORY_CARE_PROVIDER_SITE_OTHER): Payer: 59 | Admitting: Internal Medicine

## 2016-11-11 ENCOUNTER — Other Ambulatory Visit (INDEPENDENT_AMBULATORY_CARE_PROVIDER_SITE_OTHER): Payer: 59

## 2016-11-11 VITALS — BP 130/80 | HR 95 | Temp 98.1°F | Resp 20 | Wt 219.0 lb

## 2016-11-11 DIAGNOSIS — E785 Hyperlipidemia, unspecified: Secondary | ICD-10-CM | POA: Diagnosis not present

## 2016-11-11 DIAGNOSIS — R7303 Prediabetes: Secondary | ICD-10-CM | POA: Diagnosis not present

## 2016-11-11 DIAGNOSIS — Z0000 Encounter for general adult medical examination without abnormal findings: Secondary | ICD-10-CM

## 2016-11-11 DIAGNOSIS — Z1159 Encounter for screening for other viral diseases: Secondary | ICD-10-CM | POA: Diagnosis not present

## 2016-11-11 DIAGNOSIS — J45909 Unspecified asthma, uncomplicated: Secondary | ICD-10-CM

## 2016-11-11 LAB — LIPID PANEL
Cholesterol: 205 mg/dL — ABNORMAL HIGH (ref 0–200)
HDL: 62.7 mg/dL (ref >39.00)
LDL Cholesterol: 108 mg/dL — ABNORMAL HIGH (ref 0–99)
NONHDL: 142.09
Total CHOL/HDL Ratio: 3
Triglycerides: 168 mg/dL — ABNORMAL HIGH (ref 0.0–149.0)
VLDL: 33.6 mg/dL (ref 0.0–40.0)

## 2016-11-11 LAB — HEPATIC FUNCTION PANEL
ALT: 37 U/L (ref 0–53)
AST: 22 U/L (ref 0–37)
Albumin: 4.4 g/dL (ref 3.5–5.2)
Alkaline Phosphatase: 58 U/L (ref 39–117)
BILIRUBIN TOTAL: 0.3 mg/dL (ref 0.2–1.2)
Bilirubin, Direct: 0.1 mg/dL (ref 0.0–0.3)
Total Protein: 6.8 g/dL (ref 6.0–8.3)

## 2016-11-11 LAB — BASIC METABOLIC PANEL
BUN: 20 mg/dL (ref 6–23)
CO2: 30 meq/L (ref 19–32)
Calcium: 9.8 mg/dL (ref 8.4–10.5)
Chloride: 102 mEq/L (ref 96–112)
Creatinine, Ser: 0.88 mg/dL (ref 0.40–1.50)
GFR: 93.23 mL/min (ref >60.00)
GLUCOSE: 130 mg/dL — AB (ref 70–99)
POTASSIUM: 4.6 meq/L (ref 3.5–5.1)
SODIUM: 138 meq/L (ref 135–145)

## 2016-11-11 LAB — MICROALBUMIN / CREATININE URINE RATIO
Creatinine,U: 71.7 mg/dL
Microalb Creat Ratio: 1 mg/g (ref 0.0–30.0)
Microalb, Ur: <0.7 mg/dL (ref 0.0–1.9)

## 2016-11-11 LAB — TSH: TSH: 1.21 u[IU]/mL (ref 0.35–4.50)

## 2016-11-11 LAB — PSA: PSA: 0.26 ng/mL (ref 0.10–4.00)

## 2016-11-11 NOTE — Progress Notes (Signed)
Pre visit review using our clinic review tool, if applicable. No additional management support is needed unless otherwise documented below in the visit note. 

## 2016-11-11 NOTE — Telephone Encounter (Signed)
Labs placed.

## 2016-11-11 NOTE — Patient Instructions (Signed)
Please continue all other medications as before, and refills have been done if requested.  Please have the pharmacy call with any other refills you may need.  Please continue your efforts at being more active, low cholesterol diet, and weight control.  You are otherwise up to date with prevention measures today.  Please keep your appointments with your specialists as you may have planned  Your lab work was done this AM  You will be contacted by phone if any changes need to be made immediately.  Otherwise, you will receive a letter about your results with an explanation, but please check with MyChart first.  Please remember to sign up for MyChart if you have not done so, as this will be important to you in the future with finding out test results, communicating by private email, and scheduling acute appointments online when needed.  Please return in 1 year for your yearly visit, or sooner if needed, with Lab testing done 3-5 days before

## 2016-11-11 NOTE — Progress Notes (Signed)
Subjective:    Patient ID: Aaron Drake, male    DOB: Oct 20, 1956, 60 y.o.   MRN: [id number]  HPI  Here for wellness and f/u;  Overall doing ok;  Pt denies Chest pain, worsening SOB, DOE, wheezing, orthopnea, PND, worsening LE edema, palpitations, dizziness or syncope.  Pt denies neurological change such as new headache, facial or extremity weakness.  Pt denies polydipsia, polyuria, or low sugar symptoms. Pt states overall good compliance with treatment and medications, good tolerability, and has been trying to follow appropriate diet.  Pt denies worsening depressive symptoms, suicidal ideation or panic. No fever, night sweats, wt loss, loss of appetite, or other constitutional symptoms.  Pt states good ability with ADL's, has low fall risk, home safety reviewed and adequate, no other significant changes in hearing or vision, and only occasionally active with exercise.  No other new history. Not all interested in statin due to past intolerance BP Readings from Last 3 Encounters:  11/11/16 130/80  05/17/16 122/76  05/14/16 125/67  Wt has markedly incresaed due to diet, and now has a total gym at home, more sore lately. Now at 8 min per day, needs to get to 14 min before goes to level 2. Worries about asthma due to weather changes and exercise, but current meds working ok.  Sees Dr kozlow/allergy in jan 2018. Wt Readings from Last 3 Encounters:  11/11/16 219 lb (99.3 kg)  05/17/16 197 lb 1.6 oz (89.4 kg)  05/14/16 197 lb 1.6 oz (89.4 kg)  Has recent dental work in past week.  Also mentions small volume episode x 1 BRBPR a few wks ago without recurrenc he attributes to hemorrhoid, declines referral or colonoscopy Past Medical History:  Diagnosis Date  . Allergy   . Arthritis   . Asthma 05/12/2012  . BPH (benign prostatic hyperplasia) 05/19/2012  . History of kidney stones   . Hyperlipidemia   . Impaired glucose tolerance 05/19/2012   Past Surgical History:  Procedure Laterality Date  .  CATARACT EXTRACTION Right   . INSERTION OF MESH N/A 05/17/2016   Procedure: INSERTION OF MESH;  Surgeon: Rolm Bookbinder, MD;  Location: [place];  Service: General;  Laterality: N/A;  . UMBILICAL HERNIA REPAIR N/A 05/17/2016   Procedure: UMBILICAL HERNIA REPAIR;  Surgeon: Rolm Bookbinder, MD;  Location: [facility];  Service: General;  Laterality: N/A;  . WISDOM TOOTH EXTRACTION      reports that he has quit smoking. He has never used smokeless tobacco. He reports that he drinks about 0.6 oz of alcohol per week . He reports that he does not use drugs. family history includes Aortic aneurysm in his father; COPD in his father; Heart disease in his mother; Stroke in his father. Allergies  Allergen Reactions  . Lipitor [Atorvastatin] Other (See Comments)    Muscle cramps and joint ache   Current Outpatient Prescriptions on File Prior to Visit  Medication Sig Dispense Refill  . albuterol (PROAIR HFA) 108 (90 BASE) MCG/ACT inhaler Inhale 2 puffs into the lungs every 4 (four) hours as needed for wheezing or shortness of breath. 3 Inhaler 0  . beclomethasone (QVAR) 80 MCG/ACT inhaler Inhale 2 puffs into the lungs daily. (Patient taking differently: Inhale 2 puffs into the lungs every 12 (twelve) hours. ) 3 Inhaler 0  . Calcium Carb-Cholecalciferol (CALCIUM + D3 PO) Take 1 tablet by mouth daily.    . Cholecalciferol (VITAMIN D-3 PO) Take 1 tablet by mouth daily.    Marland Kitchen CRANBERRY PO Take  2-3 capsules by mouth daily.    . Cyanocobalamin (VITAMIN B-12 PO) Take 1 tablet by mouth daily.    Marland Kitchen dutasteride (AVODART) 0.5 MG capsule TAKE 1 CAPSULE DAILY 90 capsule 3  . dutasteride (AVODART) 0.5 MG capsule TAKE 1 CAPSULE DAILY 90 capsule 3  . Glucos-Chondroit-Hyaluron-MSM (GLUCOSAMINE CHONDROITIN JOINT) TABS Take 1 tablet by mouth daily.    Marland Kitchen HYDROcodone-acetaminophen (NORCO) 10-325 MG tablet Take 1 tablet by mouth every 6 (six) hours as needed. 20 tablet 0  . Ipratropium-Albuterol (COMBIVENT RESPIMAT) 20-100 MCG/ACT  AERS respimat Inhale 1 puff into the lungs every 6 (six) hours. (Patient taking differently: Inhale 1 puff into the lungs every 6 (six) hours as needed for wheezing or shortness of breath. ) 3 Inhaler 1  . loratadine (CLARITIN) 10 MG tablet Take 10 mg by mouth daily.     . montelukast (SINGULAIR) 10 MG tablet Take 1 tablet (10 mg total) by mouth daily. 90 tablet 3  . montelukast (SINGULAIR) 10 MG tablet Take 1 tablet (10 mg total) by mouth daily. 90 tablet 1  . Multiple Vitamin (MULTIVITAMIN WITH MINERALS) TABS tablet Take 1 tablet by mouth daily.    . naproxen sodium (ALEVE) 220 MG tablet Take 220 mg by mouth every 8 (eight) hours as needed (For pain.).    Marland Kitchen OVER THE COUNTER MEDICATION Take 1 tablet by mouth daily. Iodine Tablet    . umeclidinium-vilanterol (ANORO ELLIPTA) 62.5-25 MCG/INH AEPB Inhale 1 puff into the lungs daily. 30 each 0   No current facility-administered medications on file prior to visit.     Review of Systems Constitutional: Negative for increased diaphoresis, or other activity, appetite or siginficant weight change other than noted HENT: Negative for worsening hearing loss, ear pain, facial swelling, mouth sores and neck stiffness.   Eyes: Negative for other worsening pain, redness or visual disturbance.  Respiratory: Negative for choking or stridor Cardiovascular: Negative for other chest pain and palpitations.  Gastrointestinal: Negative for worsening diarrhea, blood in stool, or abdominal distention Genitourinary: Negative for hematuria, flank pain or change in urine volume.  Musculoskeletal: Negative for myalgias or other joint complaints.  Skin: Negative for other color change and wound or drainage.  Neurological: Negative for syncope and numbness. other than noted Hematological: Negative for adenopathy. or other swelling Psychiatric/Behavioral: Negative for hallucinations, SI, self-injury, decreased concentration or other worsening agitation.  All other system  neg per pt    Objective:   Physical Exam BP 130/80   Pulse 95   Temp 98.1 F (36.7 C) (Oral)   Resp 20   Wt 219 lb (99.3 kg)   SpO2 99%   BMI 37.59 kg/m  VS noted,  Constitutional: Pt is oriented to person, place, and time. Appears well-developed and well-nourished, in no significant distress Head: Normocephalic and atraumatic  Eyes: Conjunctivae and EOM are normal. Pupils are equal, round, and reactive to light Right Ear: External ear normal.  Left Ear: External ear normal Nose: Nose normal.  Mouth/Throat: Oropharynx is clear and moist  Neck: Normal range of motion. Neck supple. No JVD present. No tracheal deviation present or significant neck LA or mass Cardiovascular: Normal rate, regular rhythm, normal heart sounds and intact distal pulses.   Pulmonary/Chest: Effort normal and breath sounds without rales or wheezing  Abdominal: Soft. Bowel sounds are normal. NT. No HSM  Musculoskeletal: Normal range of motion. Exhibits no edema Lymphadenopathy: Has no cervical adenopathy.  Neurological: Pt is alert and oriented to person, place, and time. Pt has  normal reflexes. No cranial nerve deficit. Motor grossly intact Skin: Skin is warm and dry. No rash noted or new ulcers Psychiatric:  Has irritable mood and affect. Behavior is normal.  No other new exam changes    Assessment & Plan:

## 2016-11-12 LAB — CBC
HEMATOCRIT: 40.7 % (ref 39.0–52.0)
Hemoglobin: 14 g/dL (ref 13.0–17.0)
MCHC: 34.3 g/dL (ref 30.0–36.0)
MCV: 86.3 fl (ref 78.0–100.0)
Platelets: 287 10*3/uL (ref 150.0–400.0)
RBC: 4.72 Mil/uL (ref 4.22–5.81)
RDW: 14.3 % (ref 11.5–15.5)
WBC: 9.1 10*3/uL (ref 4.0–10.5)

## 2016-11-13 NOTE — Assessment & Plan Note (Signed)
Mild, stable overall by history and exam, recent data reviewed with pt, and pt to continue medical treatment as before,  to f/u any worsening symptoms or concerns Lab Results  Component Value Date   HGBA1C 7.8 (H) 01/11/2014

## 2016-11-13 NOTE — Assessment & Plan Note (Signed)
stable overall by history and exam, and pt to continue medical treatment as before,  to f/u any worsening symptoms or concerns 

## 2016-11-13 NOTE — Assessment & Plan Note (Signed)

## 2016-11-13 NOTE — Assessment & Plan Note (Signed)
Lab Results  Component Value Date   LDLCALC 108 (H) 11/11/2016   Mild, declines statin trial, to cont lower chol diet

## 2016-11-16 ENCOUNTER — Other Ambulatory Visit: Payer: Self-pay

## 2016-11-16 MED ORDER — UMECLIDINIUM-VILANTEROL 62.5-25 MCG/INH IN AEPB: 1.0000 | INHALATION_SPRAY | Freq: Every day | RESPIRATORY_TRACT | 0 refills | Status: DC

## 2016-11-16 NOTE — Telephone Encounter (Signed)
[facility] said that [facility]'t [facility] it

## 2016-11-16 NOTE — Telephone Encounter (Signed)
New Rx for Anoro sent to [facility].

## 2016-11-29 DIAGNOSIS — S83206A Unspecified tear of unspecified meniscus, current injury, right knee, initial encounter: Secondary | ICD-10-CM

## 2016-11-29 HISTORY — DX: Unspecified tear of unspecified meniscus, current injury, right knee, initial encounter: S83.206A

## 2016-12-02 ENCOUNTER — Encounter: Payer: Self-pay | Admitting: Internal Medicine

## 2016-12-02 DIAGNOSIS — J441 Chronic obstructive pulmonary disease with (acute) exacerbation: Secondary | ICD-10-CM | POA: Diagnosis not present

## 2016-12-02 NOTE — Telephone Encounter (Signed)
[place] for staff to contact pt  OK to OV today

## 2016-12-03 NOTE — Telephone Encounter (Signed)
Contacted patient.  Patient did online visit.

## 2016-12-16 DIAGNOSIS — J189 Pneumonia, unspecified organism: Secondary | ICD-10-CM | POA: Diagnosis not present

## 2016-12-16 DIAGNOSIS — J4531 Mild persistent asthma with (acute) exacerbation: Secondary | ICD-10-CM | POA: Diagnosis not present

## 2016-12-17 DIAGNOSIS — J069 Acute upper respiratory infection, unspecified: Secondary | ICD-10-CM | POA: Diagnosis not present

## 2016-12-17 DIAGNOSIS — J45901 Unspecified asthma with (acute) exacerbation: Secondary | ICD-10-CM | POA: Diagnosis not present

## 2016-12-22 ENCOUNTER — Ambulatory Visit (INDEPENDENT_AMBULATORY_CARE_PROVIDER_SITE_OTHER): Payer: 59 | Admitting: Allergy and Immunology

## 2016-12-22 ENCOUNTER — Encounter: Payer: Self-pay | Admitting: Allergy and Immunology

## 2016-12-22 VITALS — BP 132/72 | HR 93 | Resp 18

## 2016-12-22 DIAGNOSIS — J4540 Moderate persistent asthma, uncomplicated: Secondary | ICD-10-CM

## 2016-12-22 DIAGNOSIS — J3089 Other allergic rhinitis: Secondary | ICD-10-CM | POA: Diagnosis not present

## 2016-12-22 DIAGNOSIS — L2089 Other atopic dermatitis: Secondary | ICD-10-CM | POA: Diagnosis not present

## 2016-12-22 DIAGNOSIS — K219 Gastro-esophageal reflux disease without esophagitis: Secondary | ICD-10-CM | POA: Diagnosis not present

## 2016-12-22 MED ORDER — ALBUTEROL SULFATE HFA 108 (90 BASE) MCG/ACT IN AERS: 2.0000 | INHALATION_SPRAY | RESPIRATORY_TRACT | 3 refills | Status: DC | PRN

## 2016-12-22 MED ORDER — MONTELUKAST SODIUM 10 MG PO TABS: 10.0000 mg | ORAL_TABLET | Freq: Every day | ORAL | 5 refills | Status: DC

## 2016-12-22 MED ORDER — UMECLIDINIUM-VILANTEROL 62.5-25 MCG/INH IN AEPB: 1.0000 | INHALATION_SPRAY | Freq: Every day | RESPIRATORY_TRACT | 5 refills | Status: DC

## 2016-12-22 MED ORDER — BECLOMETHASONE DIPROPIONATE 80 MCG/ACT IN AERS: 2.0000 | INHALATION_SPRAY | Freq: Every day | RESPIRATORY_TRACT | 5 refills | Status: DC

## 2016-12-22 NOTE — Patient Instructions (Addendum)
  1. Continue Qvar 80 2 inhalations daily  2. Continue Anoro one inhalation daily  3. Continue montelukast 10 mg daily  4. Continue Claritin and omeprazole and ProAir HFA and topical treatment if needed  5. "Action plan" for asthma flare up:   A. increase Qvar 80 to 3 inhalations 3 times per day  B. take OTC Mucinex DM 2 tablets twice a day  C. use ProAir HFA if needed  6. Return to clinic in 6 months or earlier if problem. Change plan?

## 2016-12-22 NOTE — Progress Notes (Signed)
Follow-up Note  Referring Provider: Biagio Borg, MD Primary Provider: Cathlean Cower, MD Date of Office Visit: 12/22/2016  Subjective:   Aaron Drake (DOB: [birth year]) is a 61 y.o. male who returns to [facility] on 12/22/2016 in re-evaluation of the following:  HPI: Aaron Drake returns to this clinic in reevaluation of his asthma and allergic rhinoconjunctivitis and history of atopic dermatitis and reflux. I've not seen him in this clinic since May 2017.  He was doing very well but unfortunately a week ago he developed a flare of his asthma with wheezing and coughing and shortness of breath and needed to use his bronchodilator multiple times per day. He did not have any associated fever or chest pain or ugly sputum production and no significant nasal symptoms or reflux symptoms. He went to the urgent care center this past Friday and received prednisone and he is almost 100% better.  Prior to this event he was really doing very well and rarely used a short-acting bronchodilator. He has not required an antibiotic or systemic steroid other than mentioned above over the course of the past 6 months to treat his respiratory tract issue.  His reflux is under excellent control without consistent use of medications. He has not been having much problems with his skin requiring him to use a topical treatment.  He did receive the flu vaccine this year.  Allergies as of 12/22/2016      Reactions   Lipitor [atorvastatin] Other (See Comments)   Muscle cramps and joint ache      Medication List      albuterol 108 (90 Base) MCG/ACT inhaler Commonly known as:  PROAIR HFA Inhale 2 puffs into the lungs every 4 (four) hours as needed for wheezing or shortness of breath.   ALEVE 220 MG tablet Generic drug:  naproxen sodium Take 220 mg by mouth every 8 (eight) hours as needed (For pain.).   beclomethasone 80 MCG/ACT inhaler Commonly known as:  QVAR Inhale 2 puffs into the lungs  daily.   benzonatate 200 MG capsule Commonly known as:  TESSALON Take 200 mg by mouth 3 (three) times daily.   brompheniramine-pseudoephedrine-DM 30-2-10 MG/5ML syrup Take 5 mLs by mouth every 4 (four) hours as needed.   CALCIUM + D3 PO Take 1 tablet by mouth daily.   CRANBERRY PO Take 2-3 capsules by mouth daily.   doxycycline 100 MG tablet Commonly known as:  VIBRA-TABS Take 100 mg by mouth 2 (two) times daily.   dutasteride 0.5 MG capsule Commonly known as:  AVODART TAKE 1 CAPSULE DAILY   GLUCOSAMINE CHONDROITIN JOINT Tabs Take 1 tablet by mouth daily.   guaiFENesin-codeine 100-10 MG/5ML syrup Take 5 mLs by mouth daily.   HYDROcodone-acetaminophen 10-325 MG tablet Commonly known as:  NORCO Take 1 tablet by mouth every 6 (six) hours as needed.   Ipratropium-Albuterol 20-100 MCG/ACT Aers respimat Commonly known as:  COMBIVENT RESPIMAT Inhale 1 puff into the lungs every 6 (six) hours.   loratadine 10 MG tablet Commonly known as:  CLARITIN Take 10 mg by mouth daily.   methylPREDNISolone 4 MG Tbpk tablet Commonly known as:  MEDROL DOSEPAK Take 4 mg by mouth daily. Take until finished   montelukast 10 MG tablet Commonly known as:  SINGULAIR Take 1 tablet (10 mg total) by mouth daily.   multivitamin with minerals Tabs tablet Take 1 tablet by mouth daily.   OVER THE COUNTER MEDICATION Take 1 tablet by mouth daily. Iodine  Tablet   umeclidinium-vilanterol 62.5-25 MCG/INH Aepb Commonly known as:  ANORO ELLIPTA Inhale 1 puff into the lungs daily.   VITAMIN B-12 PO Take 1 tablet by mouth daily.   VITAMIN D-3 PO Take 1 tablet by mouth daily.       Past Medical History:  Diagnosis Date  . Allergy   . Arthritis   . Asthma 05/12/2012  . BPH (benign prostatic hyperplasia) 05/19/2012  . History of kidney stones   . Hyperlipidemia   . Impaired glucose tolerance 05/19/2012    Past Surgical History:  Procedure Laterality Date  . CATARACT EXTRACTION Right    . INSERTION OF MESH N/A 05/17/2016   Procedure: INSERTION OF MESH;  Surgeon: Rolm Bookbinder, MD;  Location: [facility];  Service: General;  Laterality: N/A;  . UMBILICAL HERNIA REPAIR N/A 05/17/2016   Procedure: UMBILICAL HERNIA REPAIR;  Surgeon: Rolm Bookbinder, MD;  Location: [facility];  Service: General;  Laterality: N/A;  . WISDOM TOOTH EXTRACTION      Review of systems negative except as noted in HPI / PMHx or noted below:  Review of Systems  Constitutional: Negative.   HENT: Negative.   Eyes: Negative.   Respiratory: Negative.   Cardiovascular: Negative.   Gastrointestinal: Negative.   Genitourinary: Negative.   Musculoskeletal: Negative.   Skin: Negative.   Neurological: Negative.   Endo/Heme/Allergies: Negative.   Psychiatric/Behavioral: Negative.      Objective:   Vitals:   12/22/16 1026  BP: 132/72  Pulse: 93  Resp: 18          Physical Exam  Constitutional: He is well-developed, well-nourished, and in no distress.  HENT:  Head: Normocephalic.  Right Ear: Tympanic membrane, external ear and ear canal normal.  Left Ear: Tympanic membrane, external ear and ear canal normal.  Nose: Nose normal. No mucosal edema or rhinorrhea.  Mouth/Throat: Uvula is midline, oropharynx is clear and moist and mucous membranes are normal. No oropharyngeal exudate.  Eyes: Conjunctivae are normal.  Neck: Trachea normal. No tracheal tenderness present. No tracheal deviation present. No thyromegaly present.  Cardiovascular: Normal rate, regular rhythm, S1 normal, S2 normal and normal heart sounds.   No murmur heard. Pulmonary/Chest: Breath sounds normal. No stridor. No respiratory distress. He has no wheezes. He has no rales.  Musculoskeletal: He exhibits no edema.  Lymphadenopathy:       Head (right side): No tonsillar adenopathy present.       Head (left side): No tonsillar adenopathy present.    He has no cervical adenopathy.  Neurological: He is alert. Gait normal.  Skin: No  rash noted. He is not diaphoretic. No erythema. Nails show no clubbing.  Psychiatric: Mood and affect normal.    Diagnostics:    Spirometry was performed and demonstrated an FEV1 of 2.19 at 87 % of predicted.  The patient had an Asthma Control Test with the following results: ACT Total Score: 16.    Assessment and Plan:   1. Asthma, moderate persistent, well-controlled   2. Other allergic rhinitis   3. Other atopic dermatitis   4. Gastroesophageal reflux disease, esophagitis presence not specified     1. Continue Qvar 80 2 inhalations daily  2. Continue Anoro one inhalation daily  3. Continue montelukast 10 mg daily  4. Continue Claritin and omeprazole and ProAir HFA and topical treatment if needed  5. "Action plan" for asthma flare up:   A. increase Qvar 80 to 3 inhalations 3 times per day  B. take OTC Mucinex DM  2 tablets twice a day  C. use ProAir HFA if needed  6. Return to clinic in 6 months or earlier if problem. Change plan?  [name] had a flareup of his asthma most likely secondary to a viral respiratory tract flare. I will assume that is the case and he will continue to use his current plan. I've given him an action plan to initiate during any future flares. If he has frequent flareups and doesn't respond to an action plan then we will obviously need to change his medical regime with a different plan. He'll keep in contact with me noting his response. I'll see him back in this clinic in 6 months or earlier if there is a problem.  Allena Katz, MD [place]

## 2016-12-31 ENCOUNTER — Other Ambulatory Visit: Payer: Self-pay | Admitting: Internal Medicine

## 2017-01-03 ENCOUNTER — Other Ambulatory Visit: Payer: Self-pay | Admitting: *Deleted

## 2017-01-03 MED ORDER — BECLOMETHASONE DIPROPIONATE 80 MCG/ACT IN AERS: 2.0000 | INHALATION_SPRAY | Freq: Every day | RESPIRATORY_TRACT | 1 refills | Status: DC

## 2017-01-06 ENCOUNTER — Encounter: Payer: Self-pay | Admitting: Allergy and Immunology

## 2017-01-06 ENCOUNTER — Other Ambulatory Visit: Payer: Self-pay | Admitting: *Deleted

## 2017-01-06 MED ORDER — MONTELUKAST SODIUM 10 MG PO TABS: 10.0000 mg | ORAL_TABLET | Freq: Every day | ORAL | 1 refills | Status: DC

## 2017-01-06 MED ORDER — ALBUTEROL SULFATE HFA 108 (90 BASE) MCG/ACT IN AERS: 2.0000 | INHALATION_SPRAY | RESPIRATORY_TRACT | 1 refills | Status: DC | PRN

## 2017-01-06 MED ORDER — UMECLIDINIUM-VILANTEROL 62.5-25 MCG/INH IN AEPB: 1.0000 | INHALATION_SPRAY | Freq: Every day | RESPIRATORY_TRACT | 1 refills | Status: DC

## 2017-01-27 ENCOUNTER — Other Ambulatory Visit: Payer: Self-pay

## 2017-01-27 MED ORDER — FLUTICASONE PROPIONATE HFA 110 MCG/ACT IN AERO: 2.0000 | INHALATION_SPRAY | Freq: Two times a day (BID) | RESPIRATORY_TRACT | 5 refills | Status: DC

## 2017-01-27 NOTE — Telephone Encounter (Signed)
We received a fax from [facility] in regards to the discontinuing Qvar 80. I sent in a prescription in for Flovent 110 2 puffs twice a day.

## 2017-03-06 ENCOUNTER — Other Ambulatory Visit: Payer: Self-pay | Admitting: Internal Medicine

## 2017-03-14 ENCOUNTER — Ambulatory Visit (INDEPENDENT_AMBULATORY_CARE_PROVIDER_SITE_OTHER): Payer: 59

## 2017-03-14 ENCOUNTER — Telehealth: Payer: Self-pay | Admitting: Internal Medicine

## 2017-03-14 ENCOUNTER — Ambulatory Visit: Payer: Self-pay

## 2017-03-14 ENCOUNTER — Encounter: Payer: Self-pay | Admitting: Internal Medicine

## 2017-03-14 ENCOUNTER — Ambulatory Visit (INDEPENDENT_AMBULATORY_CARE_PROVIDER_SITE_OTHER): Payer: 59 | Admitting: Sports Medicine

## 2017-03-14 ENCOUNTER — Encounter: Payer: Self-pay | Admitting: Sports Medicine

## 2017-03-14 VITALS — BP 142/82 | HR 96 | Ht 64.0 in | Wt 225.6 lb

## 2017-03-14 DIAGNOSIS — M25461 Effusion, right knee: Secondary | ICD-10-CM

## 2017-03-14 DIAGNOSIS — M25561 Pain in right knee: Secondary | ICD-10-CM | POA: Diagnosis not present

## 2017-03-14 NOTE — Progress Notes (Signed)
OFFICE VISIT NOTE Aaron Drake. Aaron Drake, [place] at [facility] [phone]  Aaron Drake - 61 y.o. male MRN [id number]  Date of birth: 08-07-1956  Visit Date: 03/14/2017  PCP: Cathlean Cower, MD   Referred by: Biagio Borg, MD  SUBJECTIVE:   Chief Complaint  Patient presents with  . pain in right knee    Pt c/o right knee pain since mid last week. The pain has gotten increasing worse over time. He does not recall any injury to the knee. Most of the pain is medial. The pain is worse when going from sitting to standing and standing to sitting. The pain is a "grabbing". It is easier to go up the stairs then come down. Pain is about 6/10. He has been using a brace which helps a little. He has been taking tylenol or Aleve and gets some relief. He has noticed some swelling yesterday so he iced the knee.   HPI: As above. Additional pertinent information includes:  Acute onset of right knee pain with swelling stiffness and pain over the past several days.  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  By the fourth service he was facilitating a difficult time bearing any weight.  Having worsening stiffness and discomfort.  No known injury.   ROS: ROS  Otherwise per HPI.  HISTORY & PERTINENT PRIOR DATA:  No specialty comments available. He reports that he has quit smoking. He has never used smokeless tobacco. No results for input(s): HGBA1C, LABURIC in the last 8760 hours. Medications & Allergies reviewed per EMR Patient Active Problem List   Diagnosis Date Noted  . Acute pain of right knee 03/14/2017  . Effusion of right knee 03/14/2017  . Abdominal pain, periumbilical 53/64/6803  . Left rotator cuff tear arthropathy 01/28/2014  . BPH (benign prostatic hyperplasia) 05/19/2012  . Pre-diabetes 05/19/2012  . Allergy   . Hyperlipidemia   . Preventative health care 05/12/2012  . Asthma 05/12/2012   Past Medical History:  Diagnosis Date   . Allergy   . Arthritis   . Asthma 05/12/2012  . BPH (benign prostatic hyperplasia) 05/19/2012  . History of kidney stones   . Hyperlipidemia   . Impaired glucose tolerance 05/19/2012   Family History  Problem Relation Age of Onset  . Heart disease Mother   . Stroke Father   . COPD Father   . Aortic aneurysm Father    Past Surgical History:  Procedure Laterality Date  . CATARACT EXTRACTION Right   . INSERTION OF MESH N/A 05/17/2016   Procedure: INSERTION OF MESH;  Surgeon: Rolm Bookbinder, MD;  Location: [facility];  Service: General;  Laterality: N/A;  . UMBILICAL HERNIA REPAIR N/A 05/17/2016   Procedure: UMBILICAL HERNIA REPAIR;  Surgeon: Rolm Bookbinder, MD;  Location: [facility];  Service: General;  Laterality: N/A;  . WISDOM TOOTH EXTRACTION     Social History   Occupational History  . Not on file.   Social History Main Topics  . Smoking status: Former Research scientist (life sciences)  . Smokeless tobacco: Never Used  . Alcohol use 0.6 oz/week    1 Shots of liquor per week     Comment: rare  . Drug use: No  . Sexual activity: Not on file    OBJECTIVE:  VS:  HT:5\' 4"  (162.6 cm)   WT:225 lb 9.6 oz (102.3 kg)  BMI:38.8    BP:(!) 142/82  HR:96bpm  TEMP: ( )  RESP:95 % Physical Exam  Constitutional: He appears well-developed and well-nourished. He is cooperative.  Non-toxic appearance.  HENT:  Head: Normocephalic and atraumatic.  Cardiovascular: Intact distal pulses.   Pulmonary/Chest: No accessory muscle usage. No respiratory distress.  Neurological: He is alert. He is not disoriented. No sensory deficit.  Skin: Skin is warm, dry and intact. Capillary refill takes less than 2 seconds. No abrasion and no rash noted.  Psychiatric: He has a normal mood and affect. His speech is normal and behavior is normal. Thought content normal.  Right knee:  No significant rashes/lesions/ulcerations overlying the legs.  No significant bruising or scarring  2+ pitting edema.  No clubbing or  cyanosis.  DP & PT pulses 2+/4.  LE Sensation intact to light touch.  Overall joint is well aligned, no significant deformity.    Large mildly tense effusion.    ROM: 5 to 95.    Extensor mechanism intact  Generalized medial and lateral joint line pain..    Stable to varus/valgus strain & anterior/posterior drawer.  Normal Lachman's.    Pain with McMurray's, Thessaly deferred.      IMAGING & PROCEDURES: Dg Knee Complete 4 Views Right  Result Date: 03/14/2017 CLINICAL DATA:  Worsening right knee pain without known injury for the past 5 days. EXAM: RIGHT KNEE - COMPLETE 4+ VIEW COMPARISON:  None in PACs FINDINGS: The bones are subjectively the mildly osteopenic. AP and lateral views of the right knee reveal preservation of the joint spaces. There is minimal beaking of the tibial spines. There is no acute fracture. There is a small suprapatellar effusion. IMPRESSION: There is no acute bony abnormality of the right knee. Minimal beaking of the tibial spines is observed as well is a small suprapatellar effusion. Electronically Signed   By: David  Martinique M.D.   On: 03/14/2017 15:39   No additional findings.  PROCEDURE NOTE: ULTRASOUND GUIDED RIGHT KNEE ASPIRATION & INJECTION  Images were obtained and interpreted by myself, Teresa Coombs, DO  Images have been saved and stored to PACS system. Images obtained on: GE S7 Ultrasound machine  US FINDINGS: Slight extrusion of the medial meniscus with bulging and swelling  DESCRIPTION OF PROCEDURE:  [name] clinical condition is marked by substantial pain and/or significant functional disability. Other conservative therapy has not provided relief, is contraindicated, or not appropriate. There is a reasonable likelihood that injection will significantly improve the patient's pain and/or functional impairment. After discussing the risks, benefits and expected outcomes of the injection and all questions were reviewed and answered, the  patient wished to undergo the above named procedure. Verbal consent was obtained. The ultrasound was used to identify the target structure and adjacent neurovascular structures. The skin was then prepped in sterile fashion and the target structure was injected under direct visualization using sterile technique as below: PREP: Alcohol, Ethel Chloride, 5cc 1% lidocaine on 25 needle APPROACH: Superolateral , sterile stopcock technique 21g 2" needle INJECTATE: 2 cc 0.5% marcaine, 2cc 40mg  DepoMedrol ASPIRATE: 25cc straw colored fluid  DRESSING: Band-Aid and Full Knee Body Helix Compression Sleeve  Post procedural instructions including recommending icing and warning signs for infection were reviewed. This procedure was well tolerated and there were no complications.   IMPRESSION: Succesful US Guided Aspiration & injection    ASSESSMENT & PLAN:  Visit Diagnoses:  1. Acute pain of right knee   2. Effusion of right knee    Meds: No orders of the defined types were placed in this encounter.   Orders:  Orders Placed This Encounter  Procedures  . DG Knee Complete 4 Views Right  . US GUIDED NEEDLE PLACEMENT(NO LINKED CHARGES)    Follow-up: Return in about 6 weeks (around 04/25/2017), or if symptoms worsen or fail to improve.   Otherwise please see problem oriented charting as below.

## 2017-03-14 NOTE — Telephone Encounter (Signed)
Ok with me, thanks.

## 2017-03-14 NOTE — Telephone Encounter (Signed)
Patient in office to see Dr. Paulla Fore at [facility] and decided he would like to switch PCP from Dr. Cathlean Cower at [facility] office to Dr. Briscoe Deutscher at [facility].  Please respond at your earliest convenience to acknowledge the patients request.  Thank you,  -LL

## 2017-03-15 NOTE — Telephone Encounter (Signed)
Okay 

## 2017-03-15 NOTE — Assessment & Plan Note (Signed)
Good improvement after injection.  Degenerative medial meniscal tear on ultrasound with large effusion.  Pain was improved after aspiration injection.  Recommend continued compression and avoidance of twisting and deep knee bend type activities.  Okay to resume activities as tolerated over the next 48 hours.  The lack of improvement after 2 weeks patient will call to schedule an MRI.  With only minimal degenerative change could also consider Visco supplementation down the road

## 2017-03-21 ENCOUNTER — Encounter: Payer: Self-pay | Admitting: Family Medicine

## 2017-03-21 ENCOUNTER — Ambulatory Visit (INDEPENDENT_AMBULATORY_CARE_PROVIDER_SITE_OTHER): Payer: 59 | Admitting: Family Medicine

## 2017-03-21 VITALS — BP 124/78 | HR 92 | Temp 98.4°F | Ht 64.0 in | Wt 223.0 lb

## 2017-03-21 DIAGNOSIS — E785 Hyperlipidemia, unspecified: Secondary | ICD-10-CM

## 2017-03-21 DIAGNOSIS — E119 Type 2 diabetes mellitus without complications: Secondary | ICD-10-CM | POA: Diagnosis not present

## 2017-03-21 LAB — POCT GLYCOSYLATED HEMOGLOBIN (HGB A1C): Hemoglobin A1C: 7.4

## 2017-03-21 MED ORDER — METFORMIN HCL 500 MG PO TABS: 500.0000 mg | ORAL_TABLET | Freq: Two times a day (BID) | ORAL | 3 refills | Status: DC

## 2017-03-21 NOTE — Progress Notes (Signed)
Aaron Drake is a 61 y.o. male is here to [facility].   History of Present Illness:   Water quality scientist, CMA, acting as scribe for Dr. Juleen China.  HPI:  1. Insulin resistance. Current symptoms: No polyuria, polydipsia, blurry vision, chest pain, dyspnea or claudication.  No foot burning, numbness or pain. Not watching diet or exercising.    2. Morbid obesity ([place]).  Wt Readings from Last 3 Encounters:  03/21/17 223 lb (101.2 kg)  03/14/17 225 lb 9.6 oz (102.3 kg)  11/11/16 219 lb (99.3 kg)    3. Hyperlipidemia. Myalgias with statins.  Lab Results  Component Value Date   CHOL 205 (H) 11/11/2016   HDL 62.70 11/11/2016   LDLCALC 108 (H) 11/11/2016   LDLDIRECT 136.6 01/11/2014   TRIG 168.0 (H) 11/11/2016   CHOLHDL 3 11/11/2016      PMHx, SurgHx, SocialHx, Medications, and Allergies were reviewed in the Visit Navigator and updated as appropriate.   Past Medical History:  Diagnosis Date  . Allergy   . Arthritis   . Asthma 05/12/2012  . BPH (benign prostatic hyperplasia) 05/19/2012  . History of kidney stones   . Hyperlipidemia   . Impaired glucose tolerance 05/19/2012    Past Surgical History:  Procedure Laterality Date  . CATARACT EXTRACTION Right   . INSERTION OF MESH N/A 05/17/2016   Procedure: INSERTION OF MESH;  Surgeon: Rolm Bookbinder, MD;  Location: [place];  Service: General;  Laterality: N/A;  . UMBILICAL HERNIA REPAIR N/A 05/17/2016   Procedure: UMBILICAL HERNIA REPAIR;  Surgeon: Rolm Bookbinder, MD;  Location: [facility];  Service: General;  Laterality: N/A;  . WISDOM TOOTH EXTRACTION      Family History  Problem Relation Age of Onset  . Heart disease Mother   . Stroke Father   . COPD Father   . Aortic aneurysm Father    Social History  Substance Use Topics  . Smoking status: Former Research scientist (life sciences)  . Smokeless tobacco: Never Used  . Alcohol use 0.6 oz/week    1 Shots of liquor per week     Comment: rare   Current Medications and Allergies:   .  albuterol  (PROAIR HFA) 108 (90 Base) MCG/ACT inhaler, Inhale 2 puffs into the lungs every 4 (four) hours as needed for wheezing or shortness of breath., Disp: 3 Inhaler, Rfl: 1 .  beclomethasone (QVAR) 80 MCG/ACT inhaler, Inhale 2 puffs into the lungs daily., Disp: 3 Inhaler, Rfl: 1 .  Calcium Carb-Cholecalciferol (CALCIUM + D3 PO), Take 1 tablet by mouth daily., Disp: , Rfl:  .  Cholecalciferol (VITAMIN D-3 PO), Take 1 tablet by mouth daily., Disp: , Rfl:  .  COMBIVENT RESPIMAT 20-100 MCG/ACT AERS respimat, USE 1 INHALATION ORALLY    EVERY 6 HOURS, Disp: 12 g, Rfl: 3 .  CRANBERRY PO, Take 2-3 capsules by mouth daily., Disp: , Rfl:  .  dutasteride (AVODART) 0.5 MG capsule, TAKE 1 CAPSULE DAILY, Disp: 90 capsule, Rfl: 3 .  Glucos-Chondroit-Hyaluron-MSM (GLUCOSAMINE CHONDROITIN JOINT) TABS, Take 1 tablet by mouth daily., Disp: , Rfl:  .  loratadine (CLARITIN) 10 MG tablet, Take 10 mg by mouth daily. , Disp: , Rfl:  .  montelukast (SINGULAIR) 10 MG tablet, Take 1 tablet (10 mg total) by mouth daily., Disp: 90 tablet, Rfl: 1 .  Multiple Vitamin (MULTIVITAMIN WITH MINERALS) TABS tablet, Take 1 tablet by mouth daily., Disp: , Rfl:  .  naproxen sodium (ALEVE) 220 MG tablet, Take 220 mg by mouth every 8 (eight)  hours as needed (For pain.)., Disp: , Rfl:  .  OVER THE COUNTER MEDICATION, Take 1 tablet by mouth daily. Iodine Tablet, Disp: , Rfl:  .  umeclidinium-vilanterol (ANORO ELLIPTA) 62.5-25 MCG/INH AEPB, Inhale 1 puff into the lungs daily., Disp: 90 each, Rfl: 1  Allergies  Allergen Reactions  . Lipitor [Atorvastatin] Other (See Comments)    Muscle cramps and joint ache   Review of Systems:   Review of Systems  Constitutional: Negative for chills and fever.  HENT: Negative for congestion and sore throat.   Eyes: Negative for blurred vision.  Respiratory: Negative for cough.   Cardiovascular: Negative for chest pain and palpitations.  Gastrointestinal: Negative for abdominal pain, nausea and vomiting.    Genitourinary: Negative for frequency.  Skin: Negative for rash.  Neurological: Negative for loss of consciousness and headaches.  Psychiatric/Behavioral: Negative for depression. The patient is not nervous/anxious.     Vitals:   Vitals:   03/21/17 1301  BP: 124/78  Pulse: 92  Temp: 98.4 F (36.9 C)  TempSrc: Oral  SpO2: 95%  Weight: 223 lb (101.2 kg)  Height: 5\' 4"  (1.626 m)     Body mass index is 38.28 kg/m.  Physical Exam:   Physical Exam  Constitutional: He is oriented to person, place, and time. He appears well-developed and well-nourished. No distress.  HENT:  Head: Normocephalic and atraumatic.  Right Ear: External ear normal.  Left Ear: External ear normal.  Nose: Nose normal.  Mouth/Throat: Oropharynx is clear and moist.  Eyes: Conjunctivae and EOM are normal. Pupils are equal, round, and reactive to light.  Neck: Normal range of motion. Neck supple.  Cardiovascular: Normal rate, regular rhythm, normal heart sounds and intact distal pulses.   Pulses:      Dorsalis pedis pulses are 2+ on the right side, and 2+ on the left side.       Posterior tibial pulses are 2+ on the right side, and 2+ on the left side.  Pulmonary/Chest: Effort normal and breath sounds normal.  Abdominal: Soft. Bowel sounds are normal.  Musculoskeletal: Normal range of motion.       Right foot: There is normal range of motion and no deformity.       Left foot: There is normal range of motion and no deformity.  Feet:  Right Foot:  Skin Integrity: Negative for ulcer, skin breakdown or callus.  Left Foot:  Skin Integrity: Negative for ulcer, skin breakdown or callus.  Neurological: He is alert and oriented to person, place, and time.  Skin: Skin is warm and dry.  Psychiatric: He has a normal mood and affect. His behavior is normal. Judgment and thought content normal.  Nursing note and vitals reviewed.  Results for orders placed or performed in visit on 03/21/17  POCT glycosylated  hemoglobin (Hb A1C)  Result Value Ref Range   Hemoglobin A1C 7.4    Assessment and Plan:    Aaron Drake was seen today for establish care.  Diagnoses and all orders for this visit:  Type 2 diabetes mellitus without complication, without long-term current use of insulin ([place]) Comments: New. After discussion, patient would like to start below medication. Expectations, risks, and potential side effects reviewed. Will recheck labs in 3 months. Orders: -     POCT glycosylated hemoglobin (Hb A1C) -     metFORMIN (GLUCOPHAGE) 500 MG tablet; Take 1 tablet (500 mg total) by mouth 2 (two) times daily with a meal.  Morbid obesity ([place]) Comments: The patient is asked  to make an attempt to improve diet and exercise patterns to aid in medical management of this problem.   Hyperlipidemia, unspecified hyperlipidemia type Comments: We discussed the pros and cons of lipid lowering medication based on current numbers and potential for cardiovascular risk. This was not prescribed at this time because of previous adverse reaction. Patient to work harder on diet and exercise. We have not referred patient for nutrition counseling. Will plan on rechecking lipid numbers in 3 months.    . Reviewed expectations re: course of current medical issues. . Discussed self-management of symptoms. . Outlined signs and symptoms indicating need for more acute intervention. . Patient verbalized understanding and all questions were answered. . See orders for this visit as documented in the electronic medical record. . Patient received an After Visit Summary.  Records requested if needed. I spent 30 minutes with this patient, greater than 50% was face-to-face time counseling regarding the above diagnoses.  CMA served as Education administrator during this visit. History, Physical, and Plan performed by medical provider. Documentation and orders reviewed and attested to. Briscoe Deutscher, D.O.  Briscoe Deutscher, [place], [place] 03/26/2017   Follow-up: No Follow-up on file.  Meds ordered this encounter  Medications  . metFORMIN (GLUCOPHAGE) 500 MG tablet    Sig: Take 1 tablet (500 mg total) by mouth 2 (two) times daily with a meal.    Dispense:  180 tablet    Refill:  3   Medications Discontinued During This Encounter  Medication Reason  . fluticasone (FLOVENT HFA) 110 MCG/ACT inhaler    Orders Placed This Encounter  Procedures  . POCT glycosylated hemoglobin (Hb A1C)

## 2017-03-21 NOTE — Progress Notes (Signed)
Pre visit review using our clinic review tool, if applicable. No additional management support is needed unless otherwise documented below in the visit note. 

## 2017-03-26 DIAGNOSIS — E119 Type 2 diabetes mellitus without complications: Secondary | ICD-10-CM | POA: Insufficient documentation

## 2017-03-29 ENCOUNTER — Encounter: Payer: Self-pay | Admitting: Family Medicine

## 2017-03-29 DIAGNOSIS — Z1211 Encounter for screening for malignant neoplasm of colon: Secondary | ICD-10-CM | POA: Diagnosis not present

## 2017-03-29 DIAGNOSIS — Z1212 Encounter for screening for malignant neoplasm of rectum: Secondary | ICD-10-CM | POA: Diagnosis not present

## 2017-04-06 ENCOUNTER — Encounter: Payer: Self-pay | Admitting: Family Medicine

## 2017-04-06 ENCOUNTER — Other Ambulatory Visit: Payer: Self-pay | Admitting: *Deleted

## 2017-04-06 ENCOUNTER — Encounter: Payer: Self-pay | Admitting: Allergy and Immunology

## 2017-04-06 ENCOUNTER — Other Ambulatory Visit: Payer: Self-pay

## 2017-04-06 MED ORDER — UMECLIDINIUM-VILANTEROL 62.5-25 MCG/INH IN AEPB: 1.0000 | INHALATION_SPRAY | Freq: Every day | RESPIRATORY_TRACT | 0 refills | Status: DC

## 2017-04-06 MED ORDER — IPRATROPIUM-ALBUTEROL 20-100 MCG/ACT IN AERS: INHALATION_SPRAY | RESPIRATORY_TRACT | 3 refills | Status: DC

## 2017-04-07 ENCOUNTER — Telehealth: Payer: Self-pay | Admitting: Surgical

## 2017-04-07 ENCOUNTER — Other Ambulatory Visit: Payer: Self-pay | Admitting: Family Medicine

## 2017-04-07 ENCOUNTER — Encounter: Payer: Self-pay | Admitting: Internal Medicine

## 2017-04-07 DIAGNOSIS — R195 Other fecal abnormalities: Secondary | ICD-10-CM | POA: Insufficient documentation

## 2017-04-07 NOTE — Telephone Encounter (Signed)
Spoke with patient and gave results of cologuard test. I advised that the next step would be to see GI for further testing. Patient ask what the positive result meant. I explained that positive showed precancerous or cancerous cells. The referral with GI means they would do more testing and schedule him for a coloscopy. Patient verbalized understanding. I told him that GI would be calling him for an appointment. Patient verbalized understanding.

## 2017-04-07 NOTE — Telephone Encounter (Signed)
Called and left message for patient to return call. He had a positive Cologuard result.

## 2017-04-07 NOTE — Progress Notes (Signed)
+   COLOGAURD. TO GI.

## 2017-04-08 ENCOUNTER — Encounter: Payer: Self-pay | Admitting: Internal Medicine

## 2017-04-26 ENCOUNTER — Encounter: Payer: Self-pay | Admitting: Sports Medicine

## 2017-04-26 ENCOUNTER — Ambulatory Visit (INDEPENDENT_AMBULATORY_CARE_PROVIDER_SITE_OTHER): Payer: 59 | Admitting: Sports Medicine

## 2017-04-26 VITALS — BP 142/82 | HR 97 | Ht 64.0 in | Wt 225.2 lb

## 2017-04-26 DIAGNOSIS — M25561 Pain in right knee: Secondary | ICD-10-CM

## 2017-04-26 MED ORDER — DICLOFENAC SODIUM 2 % TD SOLN: 1.0000 "application " | Freq: Two times a day (BID) | TRANSDERMAL | 2 refills | Status: DC

## 2017-04-26 MED ORDER — DICLOFENAC SODIUM 2 % TD SOLN: 1.0000 "application " | Freq: Two times a day (BID) | TRANSDERMAL | 0 refills | Status: AC

## 2017-04-26 NOTE — Progress Notes (Signed)
OFFICE VISIT NOTE Aaron Drake. Aaron Drake, [facility] [place]  Aaron Drake - 61 y.o. male MRN [id number]  Date of birth: 08/29/1956  Visit Date: 04/26/2017  PCP: Aaron Deutscher, DO   Referred by: Aaron Borg, MD  Aaron Drake, Aaron Drake acting as scribe for Dr. Paulla Drake.  SUBJECTIVE:   Chief Complaint  Patient presents with  . Follow-up    right knee pain   HPI: As below and per problem based documentation when appropriate.  Pt presents in follow-up of pain on the medial aspect of the right knee Pain started about 6 weeks ago.  No known injury or trauma.   The pain is described as aching and is rated as 1/10 currently but 6/10 when at its worst.  Worsened with prolonged periods of walking Improves with resting Therapies tried include Aleve, pt did get some relief with this.   Other associated symptoms include: Pt reports that while he was in [place] last week he did a lot of walking which caused him to have a burning sensation in his left hip. The more he walked around and limped the worse the pain in his right knee got. Pt reports that he would stop until the pain eased and then start walking again. Pt has not tried ice or heat on the knee or the hip.     Review of Systems  Constitutional: Negative for chills and fever.  Respiratory: Positive for shortness of breath (asthma). Negative for wheezing.   Musculoskeletal: Positive for joint pain. Negative for falls.  Neurological: Negative for dizziness and headaches.    Otherwise per HPI.  HISTORY & PERTINENT PRIOR DATA:  No specialty comments available. He reports that he has quit smoking. He has never used smokeless tobacco.   Recent Labs  03/21/17 1358  HGBA1C 7.4   Medications & Allergies reviewed per EMR Patient Active Problem List   Diagnosis Date Noted  . Positive colorectal cancer screening using Cologuard test 04/07/2017  . Morbid obesity ([place]) 03/26/2017  .  Type 2 diabetes mellitus without complication, without long-term current use of insulin ([place]) 03/26/2017  . Acute pain of right knee 03/14/2017  . Effusion of right knee 03/14/2017  . Left rotator cuff tear arthropathy 01/28/2014  . BPH (benign prostatic hyperplasia) 05/19/2012  . Hyperlipidemia   . Asthma 05/12/2012   Past Medical History:  Diagnosis Date  . Allergy   . Arthritis   . Asthma 05/12/2012  . BPH (benign prostatic hyperplasia) 05/19/2012  . History of kidney stones   . Hyperlipidemia   . Impaired glucose tolerance 05/19/2012   Family History  Problem Relation Age of Onset  . Heart disease Mother   . Stroke Father   . COPD Father   . Aortic aneurysm Father    Past Surgical History:  Procedure Laterality Date  . CATARACT EXTRACTION Right   . INSERTION OF MESH N/A 05/17/2016   Procedure: INSERTION OF MESH;  Surgeon: Aaron Bookbinder, MD;  Location: [place];  Service: General;  Laterality: N/A;  . UMBILICAL HERNIA REPAIR N/A 05/17/2016   Procedure: UMBILICAL HERNIA REPAIR;  Surgeon: Aaron Bookbinder, MD;  Location: [place];  Service: General;  Laterality: N/A;  . WISDOM TOOTH EXTRACTION     Social History   Occupational History  . Not on file.   Social History Main Topics  . Smoking status: Former Research scientist (life sciences)  . Smokeless tobacco: Never Used  . Alcohol use 0.6 oz/week  1 Shots of liquor per week     Comment: rare  . Drug use: No  . Sexual activity: Not Currently    OBJECTIVE:  VS:  HT:5\' 4"  (162.6 cm)   WT:225 lb 3.2 oz (102.2 kg)  BMI:38.7    BP:(!) 142/82  HR:97bpm  TEMP: ( )  RESP:95 % EXAM: Findings:  WDWN, NAD, Non-toxic appearing Alert & appropriately interactive Not depressed or anxious appearing No increased work of breathing. Pupils are equal. EOM intact without nystagmus No clubbing or cyanosis of the extremities appreciated No significant rashes/lesions/ulcerations overlying the examined area. DP and PT pulses 1+/4.  Trace pitting  edema. Sensation intact to light touch in lower extremities.  Right Knee: Overall joint is well aligned, no significant deformity.   No significant effusion.   ROM: 0 to 120.   Extensor mechanism intact Only mild medial joint line tenderness..   Stable to varus/valgus strain & anterior/posterior drawer.  Normal Lachman's.   Slight pain with McMurray's but no click.  Slight pain with [place] but no mechanical symptoms..        No results found. ASSESSMENT & PLAN:   Problem List Items Addressed This Visit    Acute pain of right knee - Primary    This is seemingly significantly improved although he is continuing to have intermittent symptoms.  Would like to see how he does over the next 6 weeks with continued strengthening as well as increasing his activity.  If any lack of improvement can consider further evaluation with MRI.  He will call us if he is interested in the interim of obtaining an MRI as this would be the next diagnostic step.  Additionally we will have him strengthening on neuromuscular control and strengthening exercises for his glute medius as well as hip abductor's.  +++++++++++++++++++++++++++++++++++++++++++++++++++++++++++++++ PROCEDURE NOTE: THERAPEUTIC EXERCISES (97110) 15 minutes spent for Therapeutic exercises as stated in above notes.  This included exercises focusing on stretching, strengthening, with significant focus on eccentric aspects.   Proper technique shown and discussed handout in great detail with ATC.  All questions were discussed and answered.           Follow-up: Return in about 6 weeks (around 06/07/2017).   Aaron Drake/ATC served as Education administrator during this visit. History, Physical, and Plan performed by medical provider. Documentation and orders reviewed and attested to.      Aaron Drake, [facility] Physician

## 2017-04-26 NOTE — Patient Instructions (Addendum)
Please perform the exercise program that Jeneen Rinks has prepared for you and gone over in detail on a daily basis.  In addition to the handout you were provided you can access your program through: [email or web address]   Your unique program code is: [email or web address]

## 2017-04-30 NOTE — Assessment & Plan Note (Addendum)
This is seemingly significantly improved although he is continuing to have intermittent symptoms.  Would like to see how he does over the next 6 weeks with continued strengthening as well as increasing his activity.  If any lack of improvement can consider further evaluation with MRI.  He will call us if he is interested in the interim of obtaining an MRI as this would be the next diagnostic step.  Additionally we will have him strengthening on neuromuscular control and strengthening exercises for his glute medius as well as hip abductor's.  +++++++++++++++++++++++++++++++++++++++++++++++++++++++++++++++ PROCEDURE NOTE: THERAPEUTIC EXERCISES (97110) 15 minutes spent for Therapeutic exercises as stated in above notes.  This included exercises focusing on stretching, strengthening, with significant focus on eccentric aspects.   Proper technique shown and discussed handout in great detail with ATC.  All questions were discussed and answered.

## 2017-05-03 DIAGNOSIS — M9903 Segmental and somatic dysfunction of lumbar region: Secondary | ICD-10-CM | POA: Diagnosis not present

## 2017-05-03 DIAGNOSIS — M9901 Segmental and somatic dysfunction of cervical region: Secondary | ICD-10-CM | POA: Diagnosis not present

## 2017-05-03 DIAGNOSIS — M9902 Segmental and somatic dysfunction of thoracic region: Secondary | ICD-10-CM | POA: Diagnosis not present

## 2017-05-04 DIAGNOSIS — M9901 Segmental and somatic dysfunction of cervical region: Secondary | ICD-10-CM | POA: Diagnosis not present

## 2017-05-04 DIAGNOSIS — M9903 Segmental and somatic dysfunction of lumbar region: Secondary | ICD-10-CM | POA: Diagnosis not present

## 2017-05-04 DIAGNOSIS — M9902 Segmental and somatic dysfunction of thoracic region: Secondary | ICD-10-CM | POA: Diagnosis not present

## 2017-05-05 DIAGNOSIS — M9903 Segmental and somatic dysfunction of lumbar region: Secondary | ICD-10-CM | POA: Diagnosis not present

## 2017-05-05 DIAGNOSIS — M9901 Segmental and somatic dysfunction of cervical region: Secondary | ICD-10-CM | POA: Diagnosis not present

## 2017-05-05 DIAGNOSIS — M9902 Segmental and somatic dysfunction of thoracic region: Secondary | ICD-10-CM | POA: Diagnosis not present

## 2017-05-09 DIAGNOSIS — M9903 Segmental and somatic dysfunction of lumbar region: Secondary | ICD-10-CM | POA: Diagnosis not present

## 2017-05-09 DIAGNOSIS — M9901 Segmental and somatic dysfunction of cervical region: Secondary | ICD-10-CM | POA: Diagnosis not present

## 2017-05-09 DIAGNOSIS — M9902 Segmental and somatic dysfunction of thoracic region: Secondary | ICD-10-CM | POA: Diagnosis not present

## 2017-05-10 ENCOUNTER — Encounter: Payer: Self-pay | Admitting: Allergy and Immunology

## 2017-05-10 ENCOUNTER — Ambulatory Visit (INDEPENDENT_AMBULATORY_CARE_PROVIDER_SITE_OTHER): Payer: 59 | Admitting: Allergy and Immunology

## 2017-05-10 VITALS — BP 138/88 | HR 80 | Resp 18

## 2017-05-10 DIAGNOSIS — M9903 Segmental and somatic dysfunction of lumbar region: Secondary | ICD-10-CM | POA: Diagnosis not present

## 2017-05-10 DIAGNOSIS — K219 Gastro-esophageal reflux disease without esophagitis: Secondary | ICD-10-CM | POA: Diagnosis not present

## 2017-05-10 DIAGNOSIS — D721 Eosinophilia, unspecified: Secondary | ICD-10-CM

## 2017-05-10 DIAGNOSIS — M9902 Segmental and somatic dysfunction of thoracic region: Secondary | ICD-10-CM | POA: Diagnosis not present

## 2017-05-10 DIAGNOSIS — M9901 Segmental and somatic dysfunction of cervical region: Secondary | ICD-10-CM | POA: Diagnosis not present

## 2017-05-10 DIAGNOSIS — L2089 Other atopic dermatitis: Secondary | ICD-10-CM | POA: Diagnosis not present

## 2017-05-10 DIAGNOSIS — J3089 Other allergic rhinitis: Secondary | ICD-10-CM

## 2017-05-10 DIAGNOSIS — J4541 Moderate persistent asthma with (acute) exacerbation: Secondary | ICD-10-CM

## 2017-05-10 MED ORDER — UMECLIDINIUM BROMIDE 62.5 MCG/INH IN AEPB: 1.0000 | INHALATION_SPRAY | Freq: Every day | RESPIRATORY_TRACT | 1 refills | Status: DC

## 2017-05-10 MED ORDER — FLUTICASONE FUROATE-VILANTEROL 200-25 MCG/INH IN AEPB: 1.0000 | INHALATION_SPRAY | Freq: Every day | RESPIRATORY_TRACT | 1 refills | Status: DC

## 2017-05-10 NOTE — Progress Notes (Signed)
Follow-up Note  Referring Provider: Briscoe Deutscher, DO Primary Provider: Briscoe Deutscher, DO Date of Office Visit: 05/10/2017  Subjective:   Aaron Drake (DOB: 1956/08/27) is a 61 y.o. male who returns to the [facility] on 05/10/2017 in re-evaluation of the following:  HPI: Aaron Drake returns to this clinic in evaluation of his asthma and history of allergic rhinoconjunctivitis and atopic dermatitis and reflux. His last visit to this clinic was January 2018.  He has noticed throughout this entire winter and spring that he has developed more problems with his breathing. He will develop intermittent wheezing and coughing and shortness of breath for which he must activate his "action plan" about twice a month for several days and as well use a bronchodilator about twice a day during this timeframe. His requirement for bronchodilator averages about 3 times per week. Overall he does not think that he has had good control of his asthma recently. He has not required a systemic steroid to treat an exacerbation of his asthma.  He had very little problems with his nose at this point in time. He has not required an antibiotic to treat a episode of sinusitis.  He has had very little problems with his reflux at this point.  He has had very little problems with his atopic dermatitis at this point.  He is now visiting a chiropractor for left hip and back pain which has apparently been a long-standing issue. He is scheduled to have a colonoscopy in 2 weeks.  Allergies as of 05/10/2017      Reactions   Lipitor [atorvastatin] Other (See Comments)   Muscle cramps and joint ache      Medication List      albuterol 108 (90 Base) MCG/ACT inhaler Commonly known as:  PROAIR HFA Inhale 2 puffs into the lungs every 4 (four) hours as needed for wheezing or shortness of breath.   ALEVE 220 MG tablet Generic drug:  naproxen sodium Take 220 mg by mouth every 8 (eight) hours as needed (For  pain.).   beclomethasone 80 MCG/ACT inhaler Commonly known as:  QVAR Inhale 2 puffs into the lungs daily.   CALCIUM + D3 PO Take 1 tablet by mouth daily.   CRANBERRY PO Take 2-3 capsules by mouth daily.   Diclofenac Sodium 2 % Soln Commonly known as:  PENNSAID Place 1 application onto the skin 2 (two) times daily.   dutasteride 0.5 MG capsule Commonly known as:  AVODART TAKE 1 CAPSULE DAILY   GLUCOSAMINE CHONDROITIN JOINT Tabs Take 1 tablet by mouth daily.   Ipratropium-Albuterol 20-100 MCG/ACT Aers respimat Commonly known as:  COMBIVENT RESPIMAT USE 1 INHALATION ORALLY    EVERY 6 HOURS   loratadine 10 MG tablet Commonly known as:  CLARITIN Take 10 mg by mouth daily.   metFORMIN 500 MG tablet Commonly known as:  GLUCOPHAGE Take 1 tablet (500 mg total) by mouth 2 (two) times daily with a meal.   montelukast 10 MG tablet Commonly known as:  SINGULAIR Take 1 tablet (10 mg total) by mouth daily.   multivitamin with minerals Tabs tablet Take 1 tablet by mouth daily.   OVER THE COUNTER MEDICATION Take 1 tablet by mouth daily. Iodine Tablet   umeclidinium-vilanterol 62.5-25 MCG/INH Aepb Commonly known as:  ANORO ELLIPTA Inhale 1 puff into the lungs daily.   VITAMIN D-3 PO Take 1 tablet by mouth daily.       Past Medical History:  Diagnosis Date  . Allergy   .  Arthritis   . Asthma 05/12/2012  . BPH (benign prostatic hyperplasia) 05/19/2012  . History of kidney stones   . Hyperlipidemia   . Impaired glucose tolerance 05/19/2012    Past Surgical History:  Procedure Laterality Date  . CATARACT EXTRACTION Right   . INSERTION OF MESH N/A 05/17/2016   Procedure: INSERTION OF MESH;  Surgeon: Rolm Bookbinder, MD;  Location: [place];  Service: General;  Laterality: N/A;  . UMBILICAL HERNIA REPAIR N/A 05/17/2016   Procedure: UMBILICAL HERNIA REPAIR;  Surgeon: Rolm Bookbinder, MD;  Location: [place];  Service: General;  Laterality: N/A;  . WISDOM TOOTH EXTRACTION       Review of systems negative except as noted in HPI / PMHx or noted below:  Review of Systems  Constitutional: Negative.   HENT: Negative.   Eyes: Negative.   Respiratory: Negative.   Cardiovascular: Negative.   Gastrointestinal: Negative.   Genitourinary: Negative.   Musculoskeletal: Negative.   Skin: Negative.   Neurological: Negative.   Endo/Heme/Allergies: Negative.   Psychiatric/Behavioral: Negative.      Objective:   Vitals:   05/10/17 1054  BP: 138/88  Pulse: 80  Resp: 18          Physical Exam  Constitutional: He is well-developed, well-nourished, and in no distress.  HENT:  Head: Normocephalic.  Right Ear: Tympanic membrane, external ear and ear canal normal.  Left Ear: Tympanic membrane, external ear and ear canal normal.  Nose: Nose normal. No mucosal edema or rhinorrhea.  Mouth/Throat: Uvula is midline, oropharynx is clear and moist and mucous membranes are normal. No oropharyngeal exudate.  Eyes: Conjunctivae are normal.  Neck: Trachea normal. No tracheal tenderness present. No tracheal deviation present. No thyromegaly present.  Cardiovascular: Normal rate, regular rhythm, S1 normal, S2 normal and normal heart sounds.   No murmur heard. Pulmonary/Chest: Breath sounds normal. No stridor. No respiratory distress. He has no wheezes. He has no rales.  Musculoskeletal: He exhibits no edema.  Lymphadenopathy:       Head (right side): No tonsillar adenopathy present.       Head (left side): No tonsillar adenopathy present.    He has no cervical adenopathy.  Neurological: He is alert. Gait normal.  Skin: No rash noted. He is not diaphoretic. No erythema. Nails show no clubbing.  Psychiatric: Mood and affect normal.    Diagnostics: Results of blood tests obtained 05/14/2016 identified a white blood cell count of 8.8 with a eosinophil count of 500, hemoglobin 14.2, platelet 259.   Spirometry was performed and demonstrated an FEV1 of 2.11 at 84 % of  predicted.  The patient had an Asthma Control Test with the following results: ACT Total Score: 14.    Assessment and Plan:   1. Asthma, not well controlled, moderate persistent, with acute exacerbation   2. Other allergic rhinitis   3. Other atopic dermatitis   4. Eosinophilia   5. Gastroesophageal reflux disease, esophagitis presence not specified     1. Start a combination of:   A. Breo 200 - one inhalation one time per day  B. Incruse - one inhalation one time per day  2. Stop QVAR and ANORO  3. Continue montelukast 10 mg daily  4. Continue Claritin and omeprazole and ProAir HFA and topical treatment if needed  5. Return to clinic in 4 weeks or earlier if problem.   6. Biological agent?  [name] appears to not have good control of his asthma at this point and we will change his  medications to have him use a combination of Breo and Incruse and see how he does over the course the next month. If he still remains symptomatic in the face of this therapy then I think we need to consider starting him on a biological agent which should get his atopic disease under better control and eliminate his requirement for his other medications.  Allena Katz, MD Allergy / Immunology [place]

## 2017-05-10 NOTE — Patient Instructions (Signed)
  1. Start a combination of:   A. Breo 200 - one inhalation one time per day  B. Incruse - one inhalation one time per day  2. Stop QVAR and ANORO  3. Continue montelukast 10 mg daily  4. Continue Claritin and omeprazole and ProAir HFA and topical treatment if needed  5. Return to clinic in 4 weeks or earlier if problem.   6. Biological agent?

## 2017-05-11 DIAGNOSIS — M9901 Segmental and somatic dysfunction of cervical region: Secondary | ICD-10-CM | POA: Diagnosis not present

## 2017-05-11 DIAGNOSIS — M9903 Segmental and somatic dysfunction of lumbar region: Secondary | ICD-10-CM | POA: Diagnosis not present

## 2017-05-11 DIAGNOSIS — M9902 Segmental and somatic dysfunction of thoracic region: Secondary | ICD-10-CM | POA: Diagnosis not present

## 2017-05-12 ENCOUNTER — Ambulatory Visit (AMBULATORY_SURGERY_CENTER): Payer: Self-pay

## 2017-05-12 VITALS — Ht 64.0 in | Wt 227.2 lb

## 2017-05-12 DIAGNOSIS — R195 Other fecal abnormalities: Secondary | ICD-10-CM

## 2017-05-12 MED ORDER — SUPREP BOWEL PREP KIT 17.5-3.13-1.6 GM/177ML PO SOLN: 1.0000 | Freq: Once | ORAL | 0 refills | Status: AC

## 2017-05-12 NOTE — Progress Notes (Signed)
No allergies to eggs or soy No diet meds No home oxygen No past problems with anesthesia  Declined emmi 

## 2017-05-16 DIAGNOSIS — M9901 Segmental and somatic dysfunction of cervical region: Secondary | ICD-10-CM | POA: Diagnosis not present

## 2017-05-16 DIAGNOSIS — M9903 Segmental and somatic dysfunction of lumbar region: Secondary | ICD-10-CM | POA: Diagnosis not present

## 2017-05-16 DIAGNOSIS — M9902 Segmental and somatic dysfunction of thoracic region: Secondary | ICD-10-CM | POA: Diagnosis not present

## 2017-05-17 DIAGNOSIS — M9903 Segmental and somatic dysfunction of lumbar region: Secondary | ICD-10-CM | POA: Diagnosis not present

## 2017-05-17 DIAGNOSIS — M9901 Segmental and somatic dysfunction of cervical region: Secondary | ICD-10-CM | POA: Diagnosis not present

## 2017-05-17 DIAGNOSIS — M9902 Segmental and somatic dysfunction of thoracic region: Secondary | ICD-10-CM | POA: Diagnosis not present

## 2017-05-18 DIAGNOSIS — M9903 Segmental and somatic dysfunction of lumbar region: Secondary | ICD-10-CM | POA: Diagnosis not present

## 2017-05-18 DIAGNOSIS — M9901 Segmental and somatic dysfunction of cervical region: Secondary | ICD-10-CM | POA: Diagnosis not present

## 2017-05-18 DIAGNOSIS — M9902 Segmental and somatic dysfunction of thoracic region: Secondary | ICD-10-CM | POA: Diagnosis not present

## 2017-05-23 DIAGNOSIS — M9903 Segmental and somatic dysfunction of lumbar region: Secondary | ICD-10-CM | POA: Diagnosis not present

## 2017-05-23 DIAGNOSIS — M9901 Segmental and somatic dysfunction of cervical region: Secondary | ICD-10-CM | POA: Diagnosis not present

## 2017-05-23 DIAGNOSIS — M9902 Segmental and somatic dysfunction of thoracic region: Secondary | ICD-10-CM | POA: Diagnosis not present

## 2017-05-26 ENCOUNTER — Encounter: Payer: Self-pay | Admitting: Internal Medicine

## 2017-05-26 ENCOUNTER — Ambulatory Visit (AMBULATORY_SURGERY_CENTER): Payer: 59 | Admitting: Internal Medicine

## 2017-05-26 VITALS — BP 138/78 | HR 87 | Temp 98.2°F | Resp 13 | Ht 64.0 in | Wt 227.0 lb

## 2017-05-26 DIAGNOSIS — Z1211 Encounter for screening for malignant neoplasm of colon: Secondary | ICD-10-CM | POA: Diagnosis not present

## 2017-05-26 DIAGNOSIS — D125 Benign neoplasm of sigmoid colon: Secondary | ICD-10-CM

## 2017-05-26 DIAGNOSIS — K6389 Other specified diseases of intestine: Secondary | ICD-10-CM

## 2017-05-26 DIAGNOSIS — R195 Other fecal abnormalities: Secondary | ICD-10-CM

## 2017-05-26 MED ORDER — SODIUM CHLORIDE 0.9 % IV SOLN: 500.0000 mL | INTRAVENOUS | Status: DC

## 2017-05-26 NOTE — Op Note (Signed)
[facility] Patient Name: Aaron Drake Procedure Date: 05/26/2017 7:49 AM MRN: [id number] Endoscopist: Jerene Bears , MD Age: 61 Referring MD:  Date of Birth: 08/22/1956 Gender: Male Account #: [id number] Procedure:                Colonoscopy Indications:              This is the patient's first colonoscopy, Positive                            Cologuard test Medicines:                Monitored Anesthesia Care Procedure:                Pre-Anesthesia Assessment:                           - Prior to the procedure, a History and Physical                            was performed, and patient medications and                            allergies were reviewed. [name] tolerance of                            previous anesthesia was also reviewed. The risks                            and benefits of the procedure and the sedation                            options and risks were discussed with the patient.                            All questions were answered, and informed consent                            was obtained. Prior Anticoagulants: The patient has                            taken no previous anticoagulant or antiplatelet                            agents. ASA Grade Assessment: II - A patient with                            mild systemic disease. After reviewing the risks                            and benefits, the patient was deemed in                            satisfactory condition to undergo the procedure.  After obtaining informed consent, the colonoscope                            was passed under direct vision. Throughout the                            procedure, the patient's blood pressure, pulse, and                            oxygen saturations were monitored continuously. The                            Colonoscope was introduced through the anus and                            advanced to the the cecum, identified by                  appendiceal orifice and ileocecal valve. The                            colonoscopy was performed without difficulty. The                            patient tolerated the procedure well. The quality                            of the bowel preparation was good. The ileocecal                            valve, appendiceal orifice, and rectum were                            photographed. Scope In: 8:07:50 AM Scope Out: 8:22:33 AM Scope Withdrawal Time: 0 hours 12 minutes 5 seconds  Total Procedure Duration: 0 hours 14 minutes 43 seconds  Findings:                 The digital rectal exam was normal.                           A 4 mm polyp was found in the sigmoid colon. The                            polyp was sessile. The polyp was removed with a                            cold snare. Resection and retrieval were complete.                           Multiple small-mouthed diverticula were found in                            the sigmoid colon and descending colon.  Internal hemorrhoids were found during                            retroflexion. The hemorrhoids were medium-sized.                           The exam was otherwise without abnormality. Complications:            No immediate complications. Estimated Blood Loss:     Estimated blood loss was minimal. Impression:               - One 4 mm polyp in the sigmoid colon, removed with                            a cold snare. Resected and retrieved.                           - Mild diverticulosis in the sigmoid colon and in                            the descending colon.                           - Internal hemorrhoids.                           - The examination was otherwise normal. Recommendation:           - Patient has a contact number available for                            emergencies. The signs and symptoms of potential                            delayed complications were discussed with the                             patient. Return to normal activities tomorrow.                            Written discharge instructions were provided to the                            patient.                           - Resume previous diet.                           - Continue present medications.                           - Await pathology results.                           - Repeat colonoscopy is recommended. The  colonoscopy date will be determined after pathology                            results from today's exam become available for                            review. Jerene Bears, MD 05/26/2017 8:25:28 AM This report has been signed electronically.

## 2017-05-26 NOTE — Progress Notes (Signed)
No problems noted in the recovery room. maw 

## 2017-05-26 NOTE — Patient Instructions (Signed)
YOU HAD AN ENDOSCOPIC PROCEDURE TODAY AT [facility]:   Refer to the procedure report that was given to you for any specific questions about what was found during the examination.  If the procedure report does not answer your questions, please call your gastroenterologist to clarify.  If you requested that your care partner not be given the details of your procedure findings, then the procedure report has been included in a sealed envelope for you to review at your convenience later.  YOU SHOULD EXPECT: Some feelings of bloating in the abdomen. Passage of more gas than usual.  Walking can help get rid of the air that was put into your GI tract during the procedure and reduce the bloating. If you had a lower endoscopy (such as a colonoscopy or flexible sigmoidoscopy) you may notice spotting of blood in your stool or on the toilet paper. If you underwent a bowel prep for your procedure, you may not have a normal bowel movement for a few days.  Please Note:  You might notice some irritation and congestion in your nose or some drainage.  This is from the oxygen used during your procedure.  There is no need for concern and it should clear up in a day or so.  SYMPTOMS TO REPORT IMMEDIATELY:   Following lower endoscopy (colonoscopy or flexible sigmoidoscopy):  Excessive amounts of blood in the stool  Significant tenderness or worsening of abdominal pains  Swelling of the abdomen that is new, acute  Fever of 100F or higher   For urgent or emergent issues, a gastroenterologist can be reached at any hour by calling [phone].   DIET:  We do recommend a small meal at first, but then you may proceed to your regular diet.  Drink plenty of fluids but you should avoid alcoholic beverages for 24 hours.  ACTIVITY:  You should plan to take it easy for the rest of today and you should NOT DRIVE or use heavy machinery until tomorrow (because of the sedation medicines used during the test).     FOLLOW UP: Our staff will call the number listed on your records the next business day following your procedure to check on you and address any questions or concerns that you may have regarding the information given to you following your procedure. If we do not reach you, we will leave a message.  However, if you are feeling well and you are not experiencing any problems, there is no need to return our call.  We will assume that you have returned to your regular daily activities without incident.  If any biopsies were taken you will be contacted by phone or by letter within the next 1-3 weeks.  Please call us at [phone] if you have not heard about the biopsies in 3 weeks.    SIGNATURES/CONFIDENTIALITY: You and/or your care partner have signed paperwork which will be entered into your electronic medical record.  These signatures attest to the fact that that the information above on your After Visit Summary has been reviewed and is understood.  Full responsibility of the confidentiality of this discharge information lies with you and/or your care-partner.    Handouts were given to your care partner on polyps, diverticulosis, hemorrhoids, [place], and a high fiber diet with liberal fluid intake. You may resume your current medications today. Your blood sugar was 150 in the recovery room. Await biopsy results. Please call if any questions or concerns.

## 2017-05-26 NOTE — Progress Notes (Signed)
Report given to PACU, vss 

## 2017-05-26 NOTE — Progress Notes (Signed)
Called to room to assist during endoscopic procedure.  Patient ID and intended procedure confirmed with present staff. Received instructions for my participation in the procedure from the performing physician.  

## 2017-05-27 ENCOUNTER — Telehealth: Payer: Self-pay | Admitting: *Deleted

## 2017-05-27 NOTE — Telephone Encounter (Signed)
  Follow up Call-  Call back number 05/26/2017  Post procedure Call Back phone  # [phone]  Permission to leave phone message Yes  Some recent data might be hidden     Patient questions:  Do you have a fever, pain , or abdominal swelling? No. Pain Score  0 *  Have you tolerated food without any problems? Yes.    Have you been able to return to your normal activities? Yes.    Do you have any questions about your discharge instructions: Diet   No. Medications  No. Follow up visit  No.  Do you have questions or concerns about your Care? No.  Actions: * If pain score is 4 or above: No action needed, pain <4.

## 2017-06-03 ENCOUNTER — Encounter: Payer: Self-pay | Admitting: Internal Medicine

## 2017-06-06 ENCOUNTER — Encounter: Payer: Self-pay | Admitting: Family Medicine

## 2017-06-06 ENCOUNTER — Ambulatory Visit (INDEPENDENT_AMBULATORY_CARE_PROVIDER_SITE_OTHER): Payer: 59 | Admitting: Family Medicine

## 2017-06-06 VITALS — HR 102 | Temp 98.2°F | Ht 64.0 in | Wt 230.6 lb

## 2017-06-06 DIAGNOSIS — E119 Type 2 diabetes mellitus without complications: Secondary | ICD-10-CM

## 2017-06-06 DIAGNOSIS — R300 Dysuria: Secondary | ICD-10-CM

## 2017-06-06 DIAGNOSIS — R05 Cough: Secondary | ICD-10-CM | POA: Diagnosis not present

## 2017-06-06 DIAGNOSIS — R059 Cough, unspecified: Secondary | ICD-10-CM

## 2017-06-06 LAB — POCT URINALYSIS DIPSTICK
Bilirubin, UA: NEGATIVE
Blood, UA: NEGATIVE
Glucose, UA: NEGATIVE
Ketones, UA: NEGATIVE
Nitrite, UA: NEGATIVE
Protein, UA: NEGATIVE
Spec Grav, UA: >=1.03 — AB (ref 1.010–1.025)
Urobilinogen, UA: 0.2 E.U./dL
pH, UA: 6 (ref 5.0–8.0)

## 2017-06-06 LAB — CBC WITH DIFFERENTIAL/PLATELET
Basophils Absolute: 0.1 10*3/uL (ref 0.0–0.1)
Basophils Relative: 1 % (ref 0.0–3.0)
Eosinophils Absolute: 0.6 10*3/uL (ref 0.0–0.7)
Eosinophils Relative: 7.1 % — ABNORMAL HIGH (ref 0.0–5.0)
HCT: 41.7 % (ref 39.0–52.0)
Hemoglobin: 13.8 g/dL (ref 13.0–17.0)
Lymphocytes Relative: 28.2 % (ref 12.0–46.0)
Lymphs Abs: 2.5 10*3/uL (ref 0.7–4.0)
MCHC: 33.2 g/dL (ref 30.0–36.0)
MCV: 86.8 fl (ref 78.0–100.0)
Monocytes Absolute: 0.6 10*3/uL (ref 0.1–1.0)
Monocytes Relative: 7.3 % (ref 3.0–12.0)
Neutro Abs: 5 10*3/uL (ref 1.4–7.7)
Neutrophils Relative %: 56.4 % (ref 43.0–77.0)
Platelets: 274 10*3/uL (ref 150.0–400.0)
RBC: 4.81 Mil/uL (ref 4.22–5.81)
RDW: 14.4 % (ref 11.5–15.5)
WBC: 8.9 10*3/uL (ref 4.0–10.5)

## 2017-06-06 LAB — COMPREHENSIVE METABOLIC PANEL
ALT: 54 U/L — ABNORMAL HIGH (ref 0–53)
AST: 29 U/L (ref 0–37)
Albumin: 4.3 g/dL (ref 3.5–5.2)
Alkaline Phosphatase: 75 U/L (ref 39–117)
BUN: 19 mg/dL (ref 6–23)
CO2: 27 mEq/L (ref 19–32)
Calcium: 9.7 mg/dL (ref 8.4–10.5)
Chloride: 102 mEq/L (ref 96–112)
Creatinine, Ser: 1.02 mg/dL (ref 0.40–1.50)
GFR: 78.48 mL/min (ref >60.00)
Glucose, Bld: 158 mg/dL — ABNORMAL HIGH (ref 70–99)
Potassium: 4.5 mEq/L (ref 3.5–5.1)
Sodium: 137 mEq/L (ref 135–145)
Total Bilirubin: 0.2 mg/dL (ref 0.2–1.2)
Total Protein: 7.2 g/dL (ref 6.0–8.3)

## 2017-06-06 LAB — BRAIN NATRIURETIC PEPTIDE: Pro B Natriuretic peptide (BNP): 14 pg/mL (ref 0.0–100.0)

## 2017-06-06 MED ORDER — DOXYCYCLINE HYCLATE 100 MG PO TABS: 100.0000 mg | ORAL_TABLET | Freq: Two times a day (BID) | ORAL | 0 refills | Status: DC

## 2017-06-06 NOTE — Progress Notes (Signed)
Aaron Drake is a 61 y.o. male here for an acute visit.  History of Present Illness:   [name] CMA acting as scribe for Dr. Juleen China.  HPI: Patient comes in today for urinary symptoms that have been going on for about a month. He has had off and on burning, pain and blood with urination. He has also had some right side flank pian. The symptoms come and go. He has had a little more blood the last couple days. He has also had some bilateral ankle edema that has been going on for about a month.   1. Dysuria Patient complains of dysuria, frequency and hematuria. He has had symptoms for several days. Patient also complains of back pain. Patient denies fever and stomach ache. Patient does not have a history of recurrent UTI. Patient does not have a history of pyelonephritis.    2. Cough Followed by Pulmonology and Allergy.  Known asthma, GERD, and seasonal allergies. Did not tolerate recent inhaler. Some LE edema reported. No CP, SOB, overt wheeze.    3. Type 2 diabetes mellitus Current symptoms: no chest pain, dyspnea or TIA's, no numbness, tingling or pain in extremities.  Taking medication compliantly without noted sided effects [x]   YES  []   NO Episodes of hypoglycemia? []   YES  [x]   NO Maintaining a diabetic diet? []   YES  [x]   NO Trying to exercise on a regular basis? []   YES  [x]   NO  On ACE inhibitor or angiotensin II receptor blocker? []   YES  [x]   NO On Aspirin? []   YES  [x]   NO  Lab Results  Component Value Date   HGBA1C 7.9 (H) 06/07/2017    Lab Results  Component Value Date   MICROALBUR <0.7 11/11/2016    Lab Results  Component Value Date   CHOL 205 (H) 11/11/2016   HDL 62.70 11/11/2016   LDLCALC 108 (H) 11/11/2016   LDLDIRECT 136.6 01/11/2014   TRIG 168.0 (H) 11/11/2016   CHOLHDL 3 11/11/2016     Wt Readings from Last 3 Encounters:  06/07/17 228 lb 9.6 oz (103.7 kg)  06/06/17 230 lb 9.6 oz (104.6 kg)  05/26/17 227 lb (103 kg)   Reports that he has quit  smoking. He has never used smokeless tobacco.  BP Readings from Last 3 Encounters:  06/07/17 (!) 144/90  06/07/17 130/82  05/26/17 138/78   Lab Results  Component Value Date   CREATININE 1.02 06/06/2017     4. Morbid obesity ([place])  Wt Readings from Last 3 Encounters:  06/07/17 228 lb 9.6 oz (103.7 kg)  06/06/17 230 lb 9.6 oz (104.6 kg)  05/26/17 227 lb (103 kg)     PMHx, SurgHx, SocialHx, Medications, and Allergies were reviewed in the Visit Navigator and updated as appropriate.  Current Medications:   .  albuterol (PROAIR HFA) 108 (90 Base) MCG/ACT inhaler, Inhale 2 puffs into the lungs every 4 (four) hours as needed for wheezing or shortness of breath., Disp: 3 Inhaler, Rfl: 1 .  beclomethasone (QVAR) 80 MCG/ACT inhaler, Inhale 2 puffs into the lungs daily. (Patient not taking: Reported on 05/26/2017), Disp: 3 Inhaler, Rfl: 1 .  Calcium Carb-Cholecalciferol (CALCIUM + D3 PO), Take 1 tablet by mouth daily., Disp: , Rfl:  .  Cholecalciferol (VITAMIN D-3 PO), Take 1 tablet by mouth daily., Disp: , Rfl:  .  CRANBERRY PO, Take 2-3 capsules by mouth daily., Disp: , Rfl:  .  dutasteride (AVODART) 0.5 MG capsule, TAKE  1 CAPSULE DAILY, Disp: 90 capsule, Rfl: 3 .  fluticasone furoate-vilanterol (BREO ELLIPTA) 200-25 MCG/INH AEPB, Inhale 1 puff into the lungs daily. Rinse, gargle, and spit after use., Disp: 60 each, Rfl: 1 .  Glucos-Chondroit-Hyaluron-MSM (GLUCOSAMINE CHONDROITIN JOINT) TABS, Take 1 tablet by mouth daily., Disp: , Rfl:  .  Ipratropium-Albuterol (COMBIVENT RESPIMAT) 20-100 MCG/ACT AERS respimat, USE 1 INHALATION ORALLY    EVERY 6 HOURS, Disp: 12 g, Rfl: 3 .  loratadine (CLARITIN) 10 MG tablet, Take 10 mg by mouth daily. , Disp: , Rfl:  .  metFORMIN (GLUCOPHAGE) 500 MG tablet, Take 1 tablet (500 mg total) by mouth 2 (two) times daily with a meal., Disp: 180 tablet, Rfl: 3 .  montelukast (SINGULAIR) 10 MG tablet, Take 1 tablet (10 mg total) by mouth daily., Disp: 90 tablet, Rfl:  1 .  Multiple Vitamin (MULTIVITAMIN WITH MINERALS) TABS tablet, Take 1 tablet by mouth daily., Disp: , Rfl:  .  naproxen sodium (ALEVE) 220 MG tablet, Take 220 mg by mouth every 8 (eight) hours as needed (For pain.)., Disp: , Rfl:  .  OMEPRAZOLE PO, Take 20 mg by mouth. , Disp: , Rfl:  .  TURMERIC PO, Take by mouth daily., Disp: , Rfl:  .  umeclidinium bromide (INCRUSE ELLIPTA) 62.5 MCG/INH AEPB, Inhale 1 puff into the lungs daily. Rinse, gargle, and spit after use., Disp: 30 each, Rfl: 1  Allergies  Allergen Reactions  . Lipitor [Atorvastatin] Other (See Comments)    Muscle cramps and joint ache   Review of Systems:   Review of Systems  All other systems reviewed and are negative.  Vitals:   Vitals:   06/06/17 1043  Pulse: (!) 102  Temp: 98.2 F (36.8 C)  TempSrc: Oral  SpO2: 95%  Weight: 230 lb 9.6 oz (104.6 kg)  Height: 5\' 4"  (1.626 m)     Body mass index is 39.58 kg/m.  Physical Exam:   Physical Exam  Constitutional: He is oriented to person, place, and time. He appears well-developed and well-nourished. No distress.  HENT:  Head: Normocephalic and atraumatic.  Right Ear: External ear normal.  Left Ear: External ear normal.  Nose: Nose normal.  Mouth/Throat: Oropharynx is clear and moist.  Eyes: Pupils are equal, round, and reactive to light. Conjunctivae and EOM are normal.  Neck: Normal range of motion. Neck supple.  Cardiovascular: Normal rate, regular rhythm, normal heart sounds and intact distal pulses.   Pulmonary/Chest: Effort normal and breath sounds normal.  Abdominal: Soft. Bowel sounds are normal.  Musculoskeletal: Normal range of motion.  Neurological: He is alert and oriented to person, place, and time.  Skin: Skin is warm and dry.  Psychiatric: He has a normal mood and affect. His behavior is normal. Judgment and thought content normal.  Nursing note and vitals reviewed.   Results for orders placed or performed in visit on 06/06/17  Urine  Culture  Result Value Ref Range   Organism ID, Bacteria NO GROWTH   CBC with Differential/Platelet  Result Value Ref Range   WBC 8.9 4.0 - 10.5 K/uL   RBC 4.81 4.22 - 5.81 Mil/uL   Hemoglobin 13.8 13.0 - 17.0 g/dL   HCT 41.7 39.0 - 52.0 %   MCV 86.8 78.0 - 100.0 fl   MCHC 33.2 30.0 - 36.0 g/dL   RDW 14.4 11.5 - 15.5 %   Platelets 274.0 150.0 - 400.0 K/uL   Neutrophils Relative % 56.4 43.0 - 77.0 %   Lymphocytes Relative 28.2 12.0 -  46.0 %   Monocytes Relative 7.3 3.0 - 12.0 %   Eosinophils Relative 7.1 (H) 0.0 - 5.0 %   Basophils Relative 1.0 0.0 - 3.0 %   Neutro Abs 5.0 1.4 - 7.7 K/uL   Lymphs Abs 2.5 0.7 - 4.0 K/uL   Monocytes Absolute 0.6 0.1 - 1.0 K/uL   Eosinophils Absolute 0.6 0.0 - 0.7 K/uL   Basophils Absolute 0.1 0.0 - 0.1 K/uL  Comprehensive metabolic panel  Result Value Ref Range   Sodium 137 135 - 145 mEq/L   Potassium 4.5 3.5 - 5.1 mEq/L   Chloride 102 96 - 112 mEq/L   CO2 27 19 - 32 mEq/L   Glucose, Bld 158 (H) 70 - 99 mg/dL   BUN 19 6 - 23 mg/dL   Creatinine, Ser 1.02 0.40 - 1.50 mg/dL   Total Bilirubin 0.2 0.2 - 1.2 mg/dL   Alkaline Phosphatase 75 39 - 117 U/L   AST 29 0 - 37 U/L   ALT 54 (H) 0 - 53 U/L   Total Protein 7.2 6.0 - 8.3 g/dL   Albumin 4.3 3.5 - 5.2 g/dL   Calcium 9.7 8.4 - 10.5 mg/dL   GFR 78.48 >60.00 mL/min  Brain natriuretic peptide  Result Value Ref Range   Pro B Natriuretic peptide (BNP) 14.0 0.0 - 100.0 pg/mL  POCT urinalysis dipstick  Result Value Ref Range   Color, UA Yellow    Clarity, UA Cloudy    Glucose, UA Negative    Bilirubin, UA Negative    Ketones, UA Negative    Spec Grav, UA >=1.030 (A) 1.010 - 1.025   Blood, UA Negative    pH, UA 6.0 5.0 - 8.0   Protein, UA Negative    Urobilinogen, UA 0.2 0.2 or 1.0 E.U./dL   Nitrite, UA Negative    Leukocytes, UA Small (1+) (A) Negative   Assessment and Plan:   Diagnoses and all orders for this visit:  Dysuria -     POCT urinalysis dipstick -     Urine Culture -      doxycycline (VIBRA-TABS) 100 MG tablet; Take 1 tablet (100 mg total) by mouth 2 (two) times daily.  Cough Comments: Followed by Pulmonology. Eosinophilia. Medications being adjusted. BNP WNL.  Orders: -     CBC with Differential/Platelet -     Comprehensive metabolic panel -     Brain natriuretic peptide  Type 2 diabetes mellitus without complication, without long-term current use of insulin ([facility]) Comments: Worsened. Consider Victoza. Patient going out of town for a retreat. Okay to start when he gets back.  Lab Results  Component Value Date   HGBA1C 7.9 (H) 06/07/2017   Orders: -     Hemoglobin A1c; Future  Morbid obesity ([place]) Comments: The patient is asked to make an attempt to improve diet and exercise patterns to aid in medical management of this problem.    . Reviewed expectations re: course of current medical issues. . Discussed self-management of symptoms. . Outlined signs and symptoms indicating need for more acute intervention. . Patient verbalized understanding and all questions were answered. Marland Kitchen Health Maintenance issues including appropriate healthy diet, exercise, and smoking avoidance were discussed with patient. . See orders for this visit as documented in the electronic medical record. . Patient received an After Visit Summary.  CMA served as Education administrator during this visit. History, Physical, and Plan performed by medical provider. The above documentation has been reviewed and is accurate and complete. Briscoe Deutscher,  D.O.  Briscoe Deutscher, DO [place] 06/11/2017  Future Appointments Date Time Provider [place]  06/20/2017 1:30 PM Briscoe Deutscher, DO [facility] None  08/08/2017 10:30 AM Gerda Diss, DO [facility] None  08/30/2017 11:00 AM Kozlow, Donnamarie Poag, MD [facility] None

## 2017-06-07 ENCOUNTER — Ambulatory Visit (INDEPENDENT_AMBULATORY_CARE_PROVIDER_SITE_OTHER): Payer: 59 | Admitting: Allergy and Immunology

## 2017-06-07 ENCOUNTER — Encounter: Payer: Self-pay | Admitting: Allergy and Immunology

## 2017-06-07 ENCOUNTER — Other Ambulatory Visit: Payer: Self-pay | Admitting: Sports Medicine

## 2017-06-07 ENCOUNTER — Encounter: Payer: Self-pay | Admitting: Sports Medicine

## 2017-06-07 ENCOUNTER — Ambulatory Visit: Payer: Self-pay

## 2017-06-07 ENCOUNTER — Other Ambulatory Visit (INDEPENDENT_AMBULATORY_CARE_PROVIDER_SITE_OTHER): Payer: 59

## 2017-06-07 ENCOUNTER — Ambulatory Visit (INDEPENDENT_AMBULATORY_CARE_PROVIDER_SITE_OTHER): Payer: 59 | Admitting: Sports Medicine

## 2017-06-07 VITALS — BP 130/82 | HR 92 | Ht 64.0 in | Wt 228.6 lb

## 2017-06-07 VITALS — BP 144/90 | HR 88 | Resp 18

## 2017-06-07 DIAGNOSIS — E119 Type 2 diabetes mellitus without complications: Secondary | ICD-10-CM

## 2017-06-07 DIAGNOSIS — M9902 Segmental and somatic dysfunction of thoracic region: Secondary | ICD-10-CM | POA: Diagnosis not present

## 2017-06-07 DIAGNOSIS — M9903 Segmental and somatic dysfunction of lumbar region: Secondary | ICD-10-CM | POA: Diagnosis not present

## 2017-06-07 DIAGNOSIS — D721 Eosinophilia, unspecified: Secondary | ICD-10-CM

## 2017-06-07 DIAGNOSIS — M9901 Segmental and somatic dysfunction of cervical region: Secondary | ICD-10-CM | POA: Diagnosis not present

## 2017-06-07 DIAGNOSIS — G8929 Other chronic pain: Secondary | ICD-10-CM | POA: Diagnosis not present

## 2017-06-07 DIAGNOSIS — M25561 Pain in right knee: Secondary | ICD-10-CM

## 2017-06-07 DIAGNOSIS — J3089 Other allergic rhinitis: Secondary | ICD-10-CM | POA: Diagnosis not present

## 2017-06-07 DIAGNOSIS — J4540 Moderate persistent asthma, uncomplicated: Secondary | ICD-10-CM

## 2017-06-07 DIAGNOSIS — M25461 Effusion, right knee: Secondary | ICD-10-CM

## 2017-06-07 DIAGNOSIS — L2089 Other atopic dermatitis: Secondary | ICD-10-CM | POA: Diagnosis not present

## 2017-06-07 LAB — HEMOGLOBIN A1C: Hgb A1c MFr Bld: 7.9 % — ABNORMAL HIGH (ref 4.6–6.5)

## 2017-06-07 NOTE — Progress Notes (Signed)
Follow-up Note  Referring Provider: Briscoe Deutscher, DO Primary Provider: Briscoe Deutscher, DO Date of Office Visit: 06/07/2017  Subjective:   Aaron Drake (DOB: 18-Mar-1956) is a 61 y.o. male who returns to the [facility] on 06/07/2017 in re-evaluation of the following:  HPI: Aaron Drake returns to this clinic in reevaluation of his active asthma for which we changed his medical therapy during his last visit of 05/10/2017.  While using a combination of 2 inhalers he has not really noticed any difference and in fact his requirement for short acting bronchodilator is still daily. He thinks that he actually did better on his previous combination of Qvar and Anoro. He believes that he has developed a little bit more problem with raspy voice while using his current 2 inhalers.  Once again his nose is doing relatively well. He has not been having any problems with his reflux. His atopic dermatitis is doing very well.  Allergies as of 06/07/2017      Reactions   Lipitor [atorvastatin] Other (See Comments)   Muscle cramps and joint ache      Medication List      albuterol 108 (90 Base) MCG/ACT inhaler Commonly known as:  PROAIR HFA Inhale 2 puffs into the lungs every 4 (four) hours as needed for wheezing or shortness of breath.   ALEVE 220 MG tablet Generic drug:  naproxen sodium Take 220 mg by mouth every 8 (eight) hours as needed (For pain.).   CALCIUM + D3 PO Take 1 tablet by mouth daily.   CRANBERRY PO Take 2-3 capsules by mouth daily.   doxycycline 100 MG tablet Commonly known as:  VIBRA-TABS Take 1 tablet (100 mg total) by mouth 2 (two) times daily.   dutasteride 0.5 MG capsule Commonly known as:  AVODART TAKE 1 CAPSULE DAILY   fluticasone furoate-vilanterol 200-25 MCG/INH Aepb Commonly known as:  BREO ELLIPTA Inhale 1 puff into the lungs daily. Rinse, gargle, and spit after use.   GLUCOSAMINE CHONDROITIN JOINT Tabs Take 1 tablet by mouth daily.     loratadine 10 MG tablet Commonly known as:  CLARITIN Take 10 mg by mouth daily.   metFORMIN 500 MG tablet Commonly known as:  GLUCOPHAGE Take 1 tablet (500 mg total) by mouth 2 (two) times daily with a meal.   montelukast 10 MG tablet Commonly known as:  SINGULAIR Take 1 tablet (10 mg total) by mouth daily.   multivitamin with minerals Tabs tablet Take 1 tablet by mouth daily.   OMEPRAZOLE PO Take 20 mg by mouth.   TURMERIC PO Take by mouth daily.   umeclidinium bromide 62.5 MCG/INH Aepb Commonly known as:  INCRUSE ELLIPTA Inhale 1 puff into the lungs daily. Rinse, gargle, and spit after use.   VITAMIN D-3 PO Take 1 tablet by mouth daily.       Past Medical History:  Diagnosis Date  . Allergy   . Arthritis   . Asthma 05/12/2012  . BPH (benign prostatic hyperplasia) 05/19/2012  . History of kidney stones   . Hyperlipidemia   . Impaired glucose tolerance 05/19/2012  . Tear of meniscus of right knee 2018   partial tear    Past Surgical History:  Procedure Laterality Date  . CATARACT EXTRACTION Right   . INSERTION OF MESH N/A 05/17/2016   Procedure: INSERTION OF MESH;  Surgeon: Rolm Bookbinder, MD;  Location: [facility];  Service: General;  Laterality: N/A;  . UMBILICAL HERNIA REPAIR N/A 05/17/2016   Procedure:  UMBILICAL HERNIA REPAIR;  Surgeon: Rolm Bookbinder, MD;  Location: [facility] [facility];  Service: General;  Laterality: N/A;  . WISDOM TOOTH EXTRACTION      Review of systems negative except as noted in HPI / PMHx or noted below:  Review of Systems  Constitutional: Negative.   HENT: Negative.   Eyes: Negative.   Respiratory: Negative.   Cardiovascular: Negative.   Gastrointestinal: Negative.   Genitourinary: Negative.   Musculoskeletal: Negative.   Skin: Negative.   Neurological: Negative.   Endo/Heme/Allergies: Negative.   Psychiatric/Behavioral: Negative.      Objective:   Vitals:   06/07/17 1616  BP: (!) 144/90  Pulse: 88  Resp: 18           Physical Exam  Constitutional: He is well-developed, well-nourished, and in no distress.  HENT:  Head: Normocephalic.  Right Ear: Tympanic membrane, external ear and ear canal normal.  Left Ear: Tympanic membrane, external ear and ear canal normal.  Nose: Nose normal. No mucosal edema or rhinorrhea.  Mouth/Throat: Uvula is midline, oropharynx is clear and moist and mucous membranes are normal. No oropharyngeal exudate.  Eyes: Conjunctivae are normal.  Neck: Trachea normal. No tracheal tenderness present. No tracheal deviation present. No thyromegaly present.  Cardiovascular: Normal rate, regular rhythm, S1 normal, S2 normal and normal heart sounds.   No murmur heard. Pulmonary/Chest: Breath sounds normal. No stridor. No respiratory distress. He has no wheezes. He has no rales.  Musculoskeletal: He exhibits no edema.  Lymphadenopathy:       Head (right side): No tonsillar adenopathy present.       Head (left side): No tonsillar adenopathy present.    He has no cervical adenopathy.  Neurological: He is alert. Gait normal.  Skin: No rash noted. He is not diaphoretic. No erythema. Nails show no clubbing.  Psychiatric: Mood and affect normal.    Diagnostics: Results of blood tests obtained 06/06/2017 identified a white blood cell count of 8.9 with an absolute eosinophil count of 600.   Spirometry was performed and demonstrated an FEV1 of 1.85 at 65 % of predicted.  The patient had an Asthma Control Test with the following results: ACT Total Score: 11.    Assessment and Plan:   1. Asthma, moderate persistent, well-controlled   2. Other allergic rhinitis   3. Other atopic dermatitis   4. Eosinophilia     1. Restart  a combination of:   A. Qvar 80 Redihaler - 2 inhalations twice a day  B. Anoro - one inhalation 1 time per day  2. Continue montelukast 10 mg daily  3. Continue Claritin and omeprazole and ProAir HFA and topical treatment if needed  4. Submit for anti-IL-5  biological agent - Benralizumab / mepolizumab  5. Return to clinic in 12 weeks or earlier if problem  6. Obtain fall flu vaccine   Aaron Drake is now a candidate for a biological agent directed at his eosinophilic driven respiratory tract disease and we will see if we can get that approved sometime over the course of the next week or 2. We will place him back on his Qvar and Anoro as he felt that this combination worked better for him then his combination of Breo and Incruse. He will contact me should he develop any significant problems in the next 12 weeks but otherwise I will just regroup with him at that point in time  Allena Katz, MD [facility]

## 2017-06-07 NOTE — Patient Instructions (Signed)

## 2017-06-07 NOTE — Progress Notes (Signed)
OFFICE VISIT NOTE [name]. Co[facility] Basnett, [facility]  KASEY EWINGS - 61 y.o. male MRN [id number]  Date of birth: Dec 10, 1955  Visit Date: 06/07/2017  PCP: Briscoe Deutscher, DO   Referred by: Briscoe Deutscher, DO  Autumn McNeil, cma acting as scribe for Dr. Paulla Fore.  SUBJECTIVE:   Chief Complaint  Patient presents with  . Follow-up  . right knee pain   HPI: As below and per problem based documentation when appropriate.   Chetan reports some improvement since last visit. The pain is location on the medial side of knee. No radiation of pain. Rest does give him relief. Exacerbation is standing, walking, and transition from sitting to standing position. He feels like there is fluid on the knee. Injection performed on 4-16 with relief. Uses Aleve PRN.      Review of Systems  Constitutional: Negative for chills, diaphoresis, fever, malaise/fatigue and weight loss.  HENT: Negative.   Eyes: Negative.   Respiratory: Negative.   Cardiovascular: Negative.   Gastrointestinal: Negative.   Genitourinary: Negative.   Musculoskeletal: Positive for joint pain and myalgias.  Skin: Negative for itching and rash.  Neurological: Negative.  Negative for weakness.  Endo/Heme/Allergies: Negative for environmental allergies and polydipsia. Does not bruise/bleed easily.  Psychiatric/Behavioral: Negative.     Otherwise per HPI.  HISTORY & PERTINENT PRIOR DATA:  No specialty comments available. He reports that he has quit smoking. He has never used smokeless tobacco.   Recent Labs  03/21/17 1358 06/07/17 1030  HGBA1C 7.4 7.9*   Medications & Allergies reviewed per EMR Patient Active Problem List   Diagnosis Date Noted  . Positive colorectal cancer screening using Cologuard test 04/07/2017  . Morbid obesity ([place]) 03/26/2017  . Type 2 diabetes mellitus without complication, without long-term current use of insulin ([place]) 03/26/2017    . Effusion of right knee 03/14/2017  . Left rotator cuff tear arthropathy 01/28/2014  . BPH (benign prostatic hyperplasia) 05/19/2012  . Hyperlipidemia   . Asthma 05/12/2012   Past Medical History:  Diagnosis Date  . Allergy   . Arthritis   . Asthma 05/12/2012  . BPH (benign prostatic hyperplasia) 05/19/2012  . History of kidney stones   . Hyperlipidemia   . Impaired glucose tolerance 05/19/2012  . Tear of meniscus of right knee 2018   partial tear   Family History  Problem Relation Age of Onset  . Heart disease Mother   . Lung cancer Mother   . Stroke Father   . COPD Father   . Aortic aneurysm Father   . Colon cancer Maternal Uncle    Past Surgical History:  Procedure Laterality Date  . CATARACT EXTRACTION Right   . INSERTION OF MESH N/A 05/17/2016   Procedure: INSERTION OF MESH;  Surgeon: Rolm Bookbinder, MD;  Location: [facility];  Service: General;  Laterality: N/A;  . UMBILICAL HERNIA REPAIR N/A 05/17/2016   Procedure: UMBILICAL HERNIA REPAIR;  Surgeon: Rolm Bookbinder, MD;  Location: [facility];  Service: General;  Laterality: N/A;  . WISDOM TOOTH EXTRACTION     Social History   Occupational History  . Not on file.   Social History Main Topics  . Smoking status: Former Research scientist (life sciences)  . Smokeless tobacco: Never Used  . Alcohol use 0.6 oz/week    1 Shots of liquor per week     Comment: rare  . Drug use: No  . Sexual activity: Not Currently    OBJECTIVE:  VS:  HT:5\' 4"  (162.6 cm)   WT:228 lb 9.6 oz (103.7 kg)  BMI:39.3    BP:130/82  HR:92bpm  TEMP: ( )  RESP:97 % EXAM: Findings:  Obese adult male in no acute distress.  Alert and appropriate.  Right lower extremity overall well aligned.  Generalized synovitis with moderate effusion.  Ligamentously stable.  Pain with McMurray's but no mechanical click.  Extensor mechanism intact.  Trace pitting edema bilaterally.  DP and PT pulses 2+/4.     No results found. ASSESSMENT & PLAN:     ICD-10-CM   1. Chronic pain of  right knee M25.561 [place] LIMITED JOINT SPACE STRUCTURES LOW RIGHT(NO LINKED CHARGES)   G89.29 Cell Count + Diff, w/o Cryst, Synvl Fld.  2. Effusion of right knee M25.461   3. Acute pain of right knee M25.561   4. Type 2 diabetes mellitus without complication, without long-term current use of insulin ([facility]) E11.9   5. Morbid obesity ([facility]) E66.01   ================================================================= Effusion of right knee Recurrent effusion today but improved. Will send for crystal analysis to ensure no crystal arthropathy.  10 week f/u. Consider viscosupplementation in future vs repeat corticosteroid =================================================================  Follow-up: Return in about 8 weeks (around 08/02/2017).   CMA/ATC served as Education administrator during this visit. History, Physical, and Plan performed by medical provider. Documentation and orders reviewed and attested to.      Teresa Coombs, [facility] Physician

## 2017-06-07 NOTE — Patient Instructions (Addendum)
  1. Restart  a combination of:   A. Qvar 80 Redihaler - 2 inhalations twice a day  B. Anoro - one inhalation 1 time per day  2. Continue montelukast 10 mg daily  3. Continue Claritin and omeprazole and ProAir HFA and topical treatment if needed  4. Submit for anti-IL-5 biological agent - Benralizumab / mepolizumab  5. Return to clinic in 12 weeks or earlier if problem  6. Obtain fall flu vaccine

## 2017-06-08 ENCOUNTER — Other Ambulatory Visit: Payer: Self-pay

## 2017-06-08 LAB — CELL COUNT + DIFF, W/O CRYST-SYNVL FLD
Basophils, %: 0 %
Eosinophils-Synovial: 0 % (ref 0–2)
Lymphocytes-Synovial Fld: 21 % (ref 0–74)
MONOCYTE/MACROPHAGE: 73 % — AB (ref 0–69)
Neutrophil, Synovial: 6 % (ref 0–24)
Synoviocytes, %: 0 % (ref 0–15)
WBC, SYNOVIAL: 126 {cells}/uL (ref <150)

## 2017-06-08 LAB — SYNOVIAL FLUID, CRYSTAL

## 2017-06-08 LAB — URINE CULTURE: Organism ID, Bacteria: NO GROWTH

## 2017-06-11 ENCOUNTER — Encounter: Payer: Self-pay | Admitting: Family Medicine

## 2017-06-20 ENCOUNTER — Encounter: Payer: Self-pay | Admitting: Family Medicine

## 2017-06-20 ENCOUNTER — Other Ambulatory Visit: Payer: Self-pay

## 2017-06-20 ENCOUNTER — Ambulatory Visit (INDEPENDENT_AMBULATORY_CARE_PROVIDER_SITE_OTHER): Payer: 59 | Admitting: Family Medicine

## 2017-06-20 VITALS — BP 122/76 | HR 84 | Temp 98.0°F | Ht 64.0 in | Wt 227.6 lb

## 2017-06-20 DIAGNOSIS — Z23 Encounter for immunization: Secondary | ICD-10-CM | POA: Diagnosis not present

## 2017-06-20 DIAGNOSIS — E119 Type 2 diabetes mellitus without complications: Secondary | ICD-10-CM

## 2017-06-20 MED ORDER — INSULIN PEN NEEDLE 31G X 6 MM MISC: 0 refills | Status: DC

## 2017-06-20 MED ORDER — LIRAGLUTIDE 18 MG/3ML ~~LOC~~ SOPN: PEN_INJECTOR | SUBCUTANEOUS | 0 refills | Status: DC

## 2017-06-20 MED ORDER — INSULIN PEN NEEDLE 31G X 6 MM MISC: 11 refills | Status: DC

## 2017-06-20 NOTE — Progress Notes (Signed)
Aaron Drake is a 61 y.o. male is here for follow up.  History of Present Illness:   Aaron Drake acting as scribe for Dr. Juleen Drake.  HPI: Patient comes in today for a three month follow up for his diabetes. He has stopped his Metformin due to liking how it reacts with his body. He is concerned about taking the insulin due to having to travel and them having to be refrigerated. He sees Dr Aaron Drake for eye exams. Will send for records. He has some edema on Monday due to being on his feet all day on Sunday.   Current symptoms: no polyuria or polydipsia, no chest pain, dyspnea or TIA's, no numbness, tingling or pain in extremities.  Taking medication compliantly without noted sided effects []   YES  [x]   NO  Home glucose monitoring in the range of  Not taking at home.   Episodes of hypoglycemia? []   YES  [x]   NO Maintaining a diabetic diet? [x]   YES  []   NO Trying to exercise on a regular basis? []   YES  [x]   NO  On ACE inhibitor or angiotensin II receptor blocker? []   YES  [x]   NO On Aspirin? []   YES  [x]   NO  Lab Results  Component Value Date   HGBA1C 7.9 (H) 06/07/2017    Lab Results  Component Value Date   MICROALBUR <0.7 11/11/2016    Lab Results  Component Value Date   CHOL 205 (H) 11/11/2016   HDL 62.70 11/11/2016   LDLCALC 108 (H) 11/11/2016   LDLDIRECT 136.6 01/11/2014   TRIG 168.0 (H) 11/11/2016   CHOLHDL 3 11/11/2016     Wt Readings from Last 3 Encounters:  06/20/17 227 lb 9.6 oz (103.2 kg)  06/07/17 228 lb 9.6 oz (103.7 kg)  06/06/17 230 lb 9.6 oz (104.6 kg)   BP Readings from Last 3 Encounters:  06/20/17 122/76  06/07/17 (!) 144/90  06/07/17 130/82   Lab Results  Component Value Date   CREATININE 1.02 06/06/2017   Hyperlipidemia Is the patient taking medications without problems? []   YES  [x]   NO Does the patient complain of muscle aches?   []   YES  [x]    NO Trying to exercise on a regular basis? []   YES  [x]   NO Diet Compliance: Not on a special  diet. Concerns: None.   Depression screen [facility] 2/9 03/21/2017 11/11/2016 01/29/2015  Decreased Interest 0 0 0  Down, Depressed, Hopeless 0 1 0  PHQ - 2 Score 0 1 0   PMHx, SurgHx, SocialHx, FamHx, Medications, and Allergies were reviewed in the Visit Navigator and updated as appropriate.   Patient Active Problem List   Diagnosis Date Noted  . Positive colorectal cancer screening using Cologuard test 04/07/2017  . Morbid obesity ([place]) 03/26/2017  . Type 2 diabetes mellitus without complication, without long-term current use of insulin ([place]) 03/26/2017  . Left rotator cuff tear arthropathy 01/28/2014  . BPH (benign prostatic hyperplasia) 05/19/2012  . Hyperlipidemia   . Asthma 05/12/2012   Social History  Substance Use Topics  . Smoking status: Former Research scientist (life sciences)  . Smokeless tobacco: Never Used  . Alcohol use 0.6 oz/week    1 Shots of liquor per week     Comment: rare   Current Medications and Allergies:   .  albuterol (PROAIR HFA) 108 (90 Base) MCG/ACT inhaler, Inhale 2 puffs into the lungs every 4 (four) hours as needed for wheezing or shortness of breath., Disp:  3 Inhaler, Rfl: 1 .  beclomethasone (QVAR) 80 MCG/ACT inhaler, Inhale 2 puffs into the lungs daily., Disp: 3 Inhaler, Rfl: 1 .  Calcium Carb-Cholecalciferol (CALCIUM + D3 PO), Take 1 tablet by mouth daily., Disp: , Rfl:  .  Cholecalciferol (VITAMIN D-3 PO), Take 1 tablet by mouth daily., Disp: , Rfl:  .  CRANBERRY PO, Take 2-3 capsules by mouth daily., Disp: , Rfl:  .  dutasteride (AVODART) 0.5 MG capsule, TAKE 1 CAPSULE DAILY, Disp: 90 capsule, Rfl: 3 .  Glucos-Chondroit-Hyaluron-MSM (GLUCOSAMINE CHONDROITIN JOINT) TABS, Take 1 tablet by mouth daily., Disp: , Rfl:  .  Ipratropium-Albuterol (COMBIVENT RESPIMAT) 20-100 MCG/ACT AERS respimat, USE 1 INHALATION ORALLY    EVERY 6 HOURS, Disp: 12 g, Rfl: 3 .  loratadine (CLARITIN) 10 MG tablet, Take 10 mg by mouth daily. , Disp: , Rfl:  .  montelukast (SINGULAIR) 10 MG tablet, Take  1 tablet (10 mg total) by mouth daily., Disp: 90 tablet, Rfl: 1 .  Multiple Vitamin (MULTIVITAMIN WITH MINERALS) TABS tablet, Take 1 tablet by mouth daily., Disp: , Rfl:  .  naproxen sodium (ALEVE) 220 MG tablet, Take 220 mg by mouth every 8 (eight) hours as needed (For pain.)., Disp: , Rfl:  .  OMEPRAZOLE PO, Take 20 mg by mouth. , Disp: , Rfl:  .  TURMERIC PO, Take by mouth daily., Disp: , Rfl:  .  umeclidinium bromide (INCRUSE ELLIPTA) 62.5 MCG/INH AEPB, Inhale 1 puff into the lungs daily. Rinse, gargle, and spit after use., Disp: 30 each, Rfl: 1   Allergies  Allergen Reactions  . Lipitor [Atorvastatin] Other (See Comments)    Muscle cramps and joint ache   Review of Systems   Pertinent items are noted in the HPI. Otherwise, ROS is negative.  Vitals:   Vitals:   06/20/17 1327  BP: 122/76  Pulse: 84  Temp: 98 F (36.7 C)  TempSrc: Oral  SpO2: 96%  Weight: 227 lb 9.6 oz (103.2 kg)  Height: 5\' 4"  (1.626 m)     Body mass index is 39.07 kg/m.  Physical Exam:   Physical Exam  Constitutional: He is oriented to person, place, and time. He appears well-developed and well-nourished. No distress.  HENT:  Head: Normocephalic and atraumatic.  Right Ear: External ear normal.  Left Ear: External ear normal.  Nose: Nose normal.  Mouth/Throat: Oropharynx is clear and moist.  Eyes: Pupils are equal, round, and reactive to light. Conjunctivae and EOM are normal.  Neck: Normal range of motion. Neck supple.  Cardiovascular: Normal rate, regular rhythm, normal heart sounds and intact distal pulses.   Pulmonary/Chest: Effort normal and breath sounds normal.  Abdominal: Soft. Bowel sounds are normal.  Musculoskeletal: Normal range of motion.  Neurological: He is alert and oriented to person, place, and time.  Skin: Skin is warm and dry.  Psychiatric: He has a normal mood and affect. His behavior is normal. Judgment and thought content normal.  Nursing note and vitals reviewed.     Assessment and Plan:   Aaron Drake was seen today for follow-up.  Diagnoses and all orders for this visit:  Type 2 diabetes mellitus without complication, without long-term current use of insulin ([place]) Comments: Patient stopped Metformin a few weeks ago. Interested in [place] after discussion. Expectations, risks, and potential side effects reviewed. See AVS for instructions. Recheck in 3 months. Orders: -     liraglutide 18 MG/3ML SOPN; start with the lowest (0.6) setting.  After a few days, increase to 1.2.  If you still have little or no nausea, increase to the highest (1.8) setting, and continue with that setting. -     Insulin Pen Needle 31G X 6 MM MISC; Use daily on Victoza.  Need for prophylactic vaccination and inoculation against varicella -     Varicella-zoster vaccine IM (Shingrix)  Morbid obesity ([place]) Comments: The patient is asked to make an attempt to improve diet and exercise patterns to aid in medical management of this problem.    . Reviewed expectations re: course of current medical issues. . Discussed self-management of symptoms. . Outlined signs and symptoms indicating need for more acute intervention. . Patient verbalized understanding and all questions were answered. Marland Kitchen Health Maintenance issues including appropriate healthy diet, exercise, and smoking avoidance were discussed with patient. . See orders for this visit as documented in the electronic medical record. . Patient received an After Visit Summary.  Drake served as Education administrator during this visit. History, Physical, and Plan performed by medical provider. The above documentation has been reviewed and is accurate and complete. Briscoe Deutscher, D.O.  Briscoe Deutscher, DO [place] [place] 06/20/2017  Future Appointments Date Time Provider [place]  08/08/2017 10:30 AM Gerda Diss, DO [facility] None  08/30/2017 11:00 AM Neldon Mc, Donnamarie Poag, MD [facility] None  09/20/2017 10:30 AM Briscoe Deutscher, DO [facility]  None

## 2017-06-20 NOTE — Patient Instructions (Signed)
You should take the VICTOZA pen once a day.  The side-effect is nausea, which goes away with time.  To avoid this side-effect, start with the lowest (0.6) setting.  After a few days, increase to 1.2.  If you still have little or no nausea, increase to the highest (1.8) setting, and continue with that setting.  [email or web address]

## 2017-06-22 ENCOUNTER — Other Ambulatory Visit: Payer: Self-pay

## 2017-06-22 NOTE — Telephone Encounter (Signed)
Refill for Anoro Ellipta sent to [facility].

## 2017-06-23 ENCOUNTER — Encounter: Payer: Self-pay | Admitting: Allergy and Immunology

## 2017-06-24 DIAGNOSIS — J4550 Severe persistent asthma, uncomplicated: Secondary | ICD-10-CM | POA: Diagnosis not present

## 2017-07-05 ENCOUNTER — Telehealth: Payer: Self-pay | Admitting: Allergy and Immunology

## 2017-07-05 ENCOUNTER — Encounter: Payer: Self-pay | Admitting: Allergy and Immunology

## 2017-07-05 ENCOUNTER — Ambulatory Visit (INDEPENDENT_AMBULATORY_CARE_PROVIDER_SITE_OTHER): Payer: 59 | Admitting: *Deleted

## 2017-07-05 ENCOUNTER — Other Ambulatory Visit: Payer: Self-pay

## 2017-07-05 DIAGNOSIS — J4550 Severe persistent asthma, uncomplicated: Secondary | ICD-10-CM

## 2017-07-05 MED ORDER — EPINEPHRINE 0.3 MG/0.3ML IJ SOAJ: 0.3000 mg | Freq: Once | INTRAMUSCULAR | 1 refills | Status: AC

## 2017-07-05 MED ORDER — BENRALIZUMAB 30 MG/ML ~~LOC~~ SOSY
30.0000 mg | PREFILLED_SYRINGE | Freq: Once | SUBCUTANEOUS | Status: AC
  Administered 2017-07-05: 30 mg via SUBCUTANEOUS

## 2017-07-05 NOTE — Telephone Encounter (Signed)
Pt called and said that his epi-pen [facility] not [facility][phone].

## 2017-07-05 NOTE — Telephone Encounter (Signed)
Spoke with pt about epipen sent in rx

## 2017-07-09 NOTE — Assessment & Plan Note (Signed)
Recurrent effusion today but improved. Will send for crystal analysis to ensure no crystal arthropathy.  10 week f/u. Consider viscosupplementation in future vs repeat corticosteroid

## 2017-07-09 NOTE — Procedures (Signed)
PROCEDURE NOTE - ULTRASOUND GUIDED ASPIRATION & INJECTION: Right Knee Images were obtained and interpreted by myself, Teresa Coombs, DO  Images have been saved and stored to PACS system. Images obtained on: GE S7 Ultrasound machine  ULTRASOUND FINDINGS: Moderate effusion with generalized synovitis.  Degenerative changes of the medial meniscus  DESCRIPTION OF PROCEDURE:  [name] clinical condition is marked by substantial pain and/or significant functional disability. Other conservative therapy has not provided relief, is contraindicated, or not appropriate. There is a reasonable likelihood that injection will significantly improve the patient's pain and/or functional impairment. After discussing the risks, benefits and expected outcomes of the injection and all questions were reviewed and answered, the patient wished to undergo the above named procedure. Verbal consent was obtained. The ultrasound was used to identify the target structure and adjacent neurovascular structures. The skin was then prepped in sterile fashion and the target structure was injected under direct visualization using sterile technique as below: PREP: Alcohol, Ethel Chloride, 3cc 1% lidocaine on 25 needle APPROACH: Superiolateral, stopcock technique, 18g 1.5" needle INJECTATE: 2 cc 1% lidocaine, 2 cc 40mg  DepoMedrol ASPIRATE: N/A DRESSING: Band-Aid and 6" Ace-Wrap  Post procedural instructions including recommending icing and warning signs for infection were reviewed. This procedure was well tolerated and there were no complications.   IMPRESSION: Succesful US Guided Aspiration & injection

## 2017-07-17 ENCOUNTER — Other Ambulatory Visit: Payer: Self-pay | Admitting: Family Medicine

## 2017-07-17 DIAGNOSIS — E119 Type 2 diabetes mellitus without complications: Secondary | ICD-10-CM

## 2017-07-27 DIAGNOSIS — J4550 Severe persistent asthma, uncomplicated: Secondary | ICD-10-CM | POA: Diagnosis not present

## 2017-08-02 ENCOUNTER — Ambulatory Visit (INDEPENDENT_AMBULATORY_CARE_PROVIDER_SITE_OTHER): Payer: 59

## 2017-08-02 DIAGNOSIS — J4550 Severe persistent asthma, uncomplicated: Secondary | ICD-10-CM | POA: Diagnosis not present

## 2017-08-02 MED ORDER — BENRALIZUMAB 30 MG/ML ~~LOC~~ SOSY
30.0000 mg | PREFILLED_SYRINGE | Freq: Once | SUBCUTANEOUS | Status: AC
  Administered 2017-08-02: 30 mg via SUBCUTANEOUS

## 2017-08-03 ENCOUNTER — Encounter: Payer: Self-pay | Admitting: Allergy and Immunology

## 2017-08-08 ENCOUNTER — Encounter: Payer: Self-pay | Admitting: Sports Medicine

## 2017-08-08 ENCOUNTER — Ambulatory Visit (INDEPENDENT_AMBULATORY_CARE_PROVIDER_SITE_OTHER): Payer: 59 | Admitting: Sports Medicine

## 2017-08-08 VITALS — BP 122/78 | HR 94 | Ht 64.0 in | Wt 222.4 lb

## 2017-08-08 DIAGNOSIS — M25461 Effusion, right knee: Secondary | ICD-10-CM | POA: Diagnosis not present

## 2017-08-08 DIAGNOSIS — M25561 Pain in right knee: Secondary | ICD-10-CM

## 2017-08-08 DIAGNOSIS — G8929 Other chronic pain: Secondary | ICD-10-CM

## 2017-08-08 DIAGNOSIS — Z23 Encounter for immunization: Secondary | ICD-10-CM

## 2017-08-08 NOTE — Progress Notes (Signed)
OFFICE VISIT NOTE Aaron Drake. [place], [place] at [place]  NUR KRASINSKI - 61 y.o. male MRN [id number]  Date of birth: Jun 15, 1956  Visit Date: 08/08/2017  PCP: Briscoe Deutscher, DO   Referred by: Briscoe Deutscher, DO  Burlene Arnt, CMA acting as scribe for Dr. Paulla Fore.  SUBJECTIVE:   Chief Complaint  Patient presents with  . Follow-up    effusion RT knee   HPI: As below and per problem based documentation when appropriate.  Aaron Drake is an established patient presenting today in follow-up of effusion of the RT knee. He was last seen 06/07/2017 and received steroid injection. Synovial fluid was aspirated and came back negative for gout.   Pt reports improvement with knee pain since receiving injection. He hasn't noticed any swelling in the knee. Pain will flare-up after a long weekend. He has been wearing compression if he knows that he will be on his feet for extended period of time. 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 Going up and down stairs at church still gives him a little trouble. When he does have pain it is still on the medial aspect of the knee. He reports that overall he feels that he is doing significantly well. He notices the pain on occasion but its not even bad enough that he feels like he needs to take an Aleve.     Review of Systems  Constitutional: Negative for chills and fever.  Respiratory: Negative for shortness of breath and wheezing.   Cardiovascular: Positive for leg swelling. Negative for chest pain and palpitations.  Gastrointestinal: Negative.   Genitourinary: Negative.   Musculoskeletal: Positive for joint pain. Negative for falls.  Neurological: Negative for dizziness, tingling and headaches.  Endo/Heme/Allergies: Does not bruise/bleed easily.    Otherwise per HPI.  HISTORY & PERTINENT PRIOR DATA:  No specialty comments available.  He reports that he has quit smoking. He has never used smokeless tobacco.   Recent Labs  03/21/17 1358 06/07/17 1030  HGBA1C 7.4 7.9*   Medications & Allergies reviewed per EMR Patient Active Problem List   Diagnosis Date Noted  . Positive colorectal cancer screening using Cologuard test 04/07/2017  . Morbid obesity ([place]) 03/26/2017  . Type 2 diabetes mellitus without complication, without long-term current use of insulin ([place]) 03/26/2017  . Effusion of right knee 03/14/2017  . Left rotator cuff tear arthropathy 01/28/2014  . BPH (benign prostatic hyperplasia) 05/19/2012  . Hyperlipidemia   . Asthma 05/12/2012   Past Medical History:  Diagnosis Date  . Allergy   . Arthritis   . Asthma 05/12/2012  . BPH (benign prostatic hyperplasia) 05/19/2012  . History of kidney stones   . Hyperlipidemia   . Impaired glucose tolerance 05/19/2012  . Tear of meniscus of right knee 2018   partial tear   Family History  Problem Relation Age of Onset  . Heart disease Mother   . Lung cancer Mother   . Stroke Aaron   . COPD Aaron   . Aortic aneurysm Aaron   . Colon cancer Maternal Uncle    Past Surgical History:  Procedure Laterality Date  . CATARACT EXTRACTION Right   . INSERTION OF MESH N/A 05/17/2016   Procedure: INSERTION OF MESH;  Surgeon: Rolm Bookbinder, MD;  Location: [facility];  Service: General;  Laterality: N/A;  . UMBILICAL HERNIA REPAIR N/A 05/17/2016   Procedure:  UMBILICAL HERNIA REPAIR;  Surgeon: Rolm Bookbinder, MD;  Location: [facility];  Service: General;  Laterality: N/A;  . WISDOM TOOTH EXTRACTION     Social History   Occupational History  . Not on file.   Social History Main Topics  . Smoking status: Former Research scientist (life sciences)  . Smokeless tobacco: Never Used  . Alcohol use 0.6 oz/week    1 Shots of liquor per week     Comment: rare  . Drug use: No  . Sexual activity: Not Currently    OBJECTIVE:  VS:  HT:5\' 4"  (162.6 cm)   WT:222 lb 6.4 oz (100.9 kg)  BMI:38.16     BP:122/78  HR:94bpm  TEMP: ( )  RESP:96 % EXAM: Findings:  Right knee is overall well aligned.  He has only a small amount of synovitis today without significant effusion.  He has 2-3 mm of opening with valgus testing this is mild.  No pain with McMurray's.  He is able to perform [facility] without significant limitations.  His VMO definition is improved.  Anterior posterior drawer stable. Skin is unchanged.  Does have a small sebaceous cyst just inferior to the knee but this is not painful and no surrounding erythema.  Otherwise no overlying skin changes.  No significant pretibial edema.     No results found. ASSESSMENT & PLAN:     ICD-10-CM   1. Effusion of right knee M25.461   2. Need for influenza vaccination Z23 Flu Vaccine QUAD 6+ mos PF IM (Fluarix Quad PF)  3. Chronic pain of right knee M25.561    G89.29   ================================================================= Effusion of right knee Overall he is doing remarkably well.  He has essentially no effusion at this time.  His knee has not significantly limited him in any way although he does have occasional twinges of pain that are only intermittent and tend to be walking long distances.  Recommend continuing with strengthening and therapeutic exercises as previously outlined, continue with pen said as needed and/or Aleve as needed but otherwise I will have him follow-up as needed only. =================================================================  Follow-up: Return if symptoms worsen or fail to improve.   CMA/ATC served as Education administrator during this visit. History, Physical, and Plan performed by medical provider. Documentation and orders reviewed and attested to.      Teresa Coombs, [facility] Physician

## 2017-08-08 NOTE — Assessment & Plan Note (Signed)
Overall he is doing remarkably well.  He has essentially no effusion at this time.  His knee has not significantly limited him in any way although he does have occasional twinges of pain that are only intermittent and tend to be walking long distances.  Recommend continuing with strengthening and therapeutic exercises as previously outlined, continue with pen said as needed and/or Aleve as needed but otherwise I will have him follow-up as needed only.

## 2017-08-09 ENCOUNTER — Other Ambulatory Visit: Payer: Self-pay | Admitting: Family Medicine

## 2017-08-09 DIAGNOSIS — E119 Type 2 diabetes mellitus without complications: Secondary | ICD-10-CM

## 2017-08-10 ENCOUNTER — Encounter: Payer: Self-pay | Admitting: Allergy and Immunology

## 2017-08-20 DIAGNOSIS — J4550 Severe persistent asthma, uncomplicated: Secondary | ICD-10-CM | POA: Diagnosis not present

## 2017-08-21 ENCOUNTER — Encounter: Payer: Self-pay | Admitting: Family Medicine

## 2017-08-22 ENCOUNTER — Other Ambulatory Visit: Payer: Self-pay | Admitting: Surgical

## 2017-08-24 ENCOUNTER — Other Ambulatory Visit: Payer: Self-pay

## 2017-08-24 MED ORDER — DUTASTERIDE 0.5 MG PO CAPS
0.5000 mg | ORAL_CAPSULE | Freq: Every day | ORAL | 3 refills | Status: DC
Start: 2017-08-24 — End: 2019-01-25

## 2017-08-30 ENCOUNTER — Ambulatory Visit: Payer: 59

## 2017-08-30 ENCOUNTER — Ambulatory Visit (INDEPENDENT_AMBULATORY_CARE_PROVIDER_SITE_OTHER): Payer: 59 | Admitting: Allergy and Immunology

## 2017-08-30 ENCOUNTER — Encounter: Payer: Self-pay | Admitting: Allergy and Immunology

## 2017-08-30 VITALS — BP 140/84 | HR 84 | Resp 16

## 2017-08-30 DIAGNOSIS — J4550 Severe persistent asthma, uncomplicated: Secondary | ICD-10-CM | POA: Diagnosis not present

## 2017-08-30 DIAGNOSIS — D721 Eosinophilia, unspecified: Secondary | ICD-10-CM

## 2017-08-30 DIAGNOSIS — J3089 Other allergic rhinitis: Secondary | ICD-10-CM | POA: Diagnosis not present

## 2017-08-30 DIAGNOSIS — L2089 Other atopic dermatitis: Secondary | ICD-10-CM | POA: Diagnosis not present

## 2017-08-30 NOTE — Progress Notes (Signed)
Follow-up Note  Referring Provider: Briscoe Deutscher, DO Primary Provider: Briscoe Deutscher, DO Date of Office Visit: 08/30/2017  Subjective:   Aaron Drake (DOB: [birth year]) is a 61 y.o. male who returns to the [facility] [place] on 08/30/2017 in re-evaluation of the following:  HPI: Firman returns to this clinic in reevaluation of his severe asthma treated with anti-L5 biological agent, history of allergic rhinitis, and history of atopic dermatitis. His last visit to this clinic was July 2018.  He just received his third injection of benralizumab today without incident. He is not really sure that he has had much benefit from this form of therapy yet. Although he may have a little bit less shortness of breath and chest tightness he still needs to use a short acting bronchodilator a few times a day. He has not required a systemic steroid to treat an exacerbation since I have seen him in this clinic.  He still continues to have some occasional nasal congestion and postnasal drip. He has been using his Claritin and montelukast consistently. He does not have any anosmia or ugly nasal discharge.  His reflux is under very good control at this point in time while using his omeprazole.  He did receive the flu vaccine this year and also receive the shingles vaccine and has received the Prevnar and Pneumovax in the past.  Allergies as of 08/30/2017      Reactions   Lipitor [atorvastatin] Other (See Comments)   Muscle cramps and joint ache      Medication List      albuterol 108 (90 Base) MCG/ACT inhaler Commonly known as:  PROAIR HFA Inhale 2 puffs into the lungs every 4 (four) hours as needed for wheezing or shortness of breath.   ALEVE 220 MG tablet Generic drug:  naproxen sodium Take 220 mg by mouth every 8 (eight) hours as needed (For pain.).   beclomethasone 80 MCG/ACT inhaler Commonly known as:  QVAR Inhale 2 puffs into the lungs daily.   CALCIUM + D3 PO Take 1 tablet  by mouth daily.   CRANBERRY PO Take 2-3 capsules by mouth daily.   dutasteride 0.5 MG capsule Commonly known as:  AVODART Take 1 capsule (0.5 mg total) by mouth daily.   FASENRA 30 MG/ML Sosy Generic drug:  Benralizumab Inject into the skin.   GLUCOSAMINE CHONDROITIN JOINT Tabs Take 1 tablet by mouth daily.   Insulin Pen Needle 31G X 6 MM Misc Use daily on Victoza.  Dx:  E11.9   Ipratropium-Albuterol 20-100 MCG/ACT Aers respimat Commonly known as:  COMBIVENT RESPIMAT USE 1 INHALATION ORALLY    EVERY 6 HOURS   loratadine 10 MG tablet Commonly known as:  CLARITIN Take 10 mg by mouth daily.   montelukast 10 MG tablet Commonly known as:  SINGULAIR Take 1 tablet (10 mg total) by mouth daily.   multivitamin with minerals Tabs tablet Take 1 tablet by mouth daily.   OMEPRAZOLE PO Take 20 mg by mouth.   TURMERIC PO Take by mouth daily.   umeclidinium bromide 62.5 MCG/INH Aepb Commonly known as:  INCRUSE ELLIPTA Inhale 1 puff into the lungs daily. Rinse, gargle, and spit after use.   VICTOZA 18 MG/3ML Sopn Generic drug:  liraglutide INJECT 0.3 MLS (1.8 MG TOTAL) INTO THE SKIN DAILY.   VITAMIN D-3 PO Take 1 tablet by mouth daily.       Past Medical History:  Diagnosis Date  . Allergy   . Arthritis   .  Asthma 05/12/2012  . BPH (benign prostatic hyperplasia) 05/19/2012  . History of kidney stones   . Hyperlipidemia   . Impaired glucose tolerance 05/19/2012  . Tear of meniscus of right knee 2018   partial tear    Past Surgical History:  Procedure Laterality Date  . CATARACT EXTRACTION Right   . INSERTION OF MESH N/A 05/17/2016   Procedure: INSERTION OF MESH;  Surgeon: Rolm Bookbinder, MD;  Location: [place];  Service: General;  Laterality: N/A;  . UMBILICAL HERNIA REPAIR N/A 05/17/2016   Procedure: UMBILICAL HERNIA REPAIR;  Surgeon: Rolm Bookbinder, MD;  Location: [place];  Service: General;  Laterality: N/A;  . WISDOM TOOTH EXTRACTION      Review of systems  negative except as noted in HPI / PMHx or noted below:  Review of Systems  Constitutional: Negative.   HENT: Negative.   Eyes: Negative.   Respiratory: Negative.   Cardiovascular: Negative.   Gastrointestinal: Negative.   Genitourinary: Negative.   Musculoskeletal: Negative.   Skin: Negative.   Neurological: Negative.   Endo/Heme/Allergies: Negative.   Psychiatric/Behavioral: Negative.      Objective:   Vitals:   08/30/17 1102  BP: 140/84  Pulse: 84  Resp: 16          Physical Exam  Constitutional: He is well-developed, well-nourished, and in no distress.  HENT:  Head: Normocephalic.  Right Ear: Tympanic membrane, external ear and ear canal normal.  Left Ear: Tympanic membrane, external ear and ear canal normal.  Nose: Nose normal. No mucosal edema or rhinorrhea.  Mouth/Throat: Uvula is midline, oropharynx is clear and moist and mucous membranes are normal. No oropharyngeal exudate.  Eyes: Conjunctivae are normal.  Neck: Trachea normal. No tracheal tenderness present. No tracheal deviation present. No thyromegaly present.  Cardiovascular: Normal rate, regular rhythm, S1 normal, S2 normal and normal heart sounds.   No murmur heard. Pulmonary/Chest: Breath sounds normal. No stridor. No respiratory distress. He has no wheezes. He has no rales.  Musculoskeletal: He exhibits no edema.  Lymphadenopathy:       Head (right side): No tonsillar adenopathy present.       Head (left side): No tonsillar adenopathy present.    He has no cervical adenopathy.  Neurological: He is alert. Gait normal.  Skin: No rash noted. He is not diaphoretic. No erythema. Nails show no clubbing.  Psychiatric: Mood and affect normal.    Diagnostics:    Spirometry was performed and demonstrated an FEV1 of 2.10 at 74 % of predicted.  The patient had an Asthma Control Test with the following results: ACT Total Score: 11.    Assessment and Plan:   1. Not well controlled severe persistent  asthma   2. Other allergic rhinitis   3. Other atopic dermatitis   4. Eosinophilia     1. Continue a combination of:   A. Qvar 80 Redihaler - 2 inhalations twice a day  B. Anoro - one inhalation 1 time per day  2. Continue montelukast 10 mg daily  3. Continue Claritin and omeprazole and ProAir HFA and topical treatment if needed  4. Continue Benralizumab   5. Return to clinic in 12 weeks or earlier if problem   [name] is stable with stability defined by the requirement for short acting bronchodilator a few times a day. We have tried multiple forms of anti-inflammatory therapy in the past and he has settled on a combination of Qvar and Anoro which she thinks works relatively well at this point. We  will continue him on anti-IL-5 biological agent injections and see him back in this clinic in 12 weeks to make a decision about whether or not we continue to use this form of treatment. He will continue to use other medical therapy directed against upper airway inflammation and reflux as noted above. I will see him back in this clinic in 12 weeks or earlier if there is a problem.  Allena Katz, MD Allergy / Immunology [place]

## 2017-08-30 NOTE — Patient Instructions (Signed)
  1. Continue a combination of:   A. Qvar 80 Redihaler - 2 inhalations twice a day  B. Anoro - one inhalation 1 time per day  2. Continue montelukast 10 mg daily  3. Continue Claritin and omeprazole and ProAir HFA and topical treatment if needed  4. Continue Benralizumab   5. Return to clinic in 12 weeks or earlier if problem

## 2017-08-31 NOTE — Addendum Note (Signed)
Addended by: Herbie Drape on: 08/31/2017 08:48 AM   Modules accepted: Orders

## 2017-09-20 ENCOUNTER — Ambulatory Visit (INDEPENDENT_AMBULATORY_CARE_PROVIDER_SITE_OTHER): Payer: 59 | Admitting: Family Medicine

## 2017-09-20 ENCOUNTER — Encounter: Payer: Self-pay | Admitting: Family Medicine

## 2017-09-20 VITALS — BP 122/68 | HR 92 | Temp 98.2°F | Ht 64.0 in | Wt 222.6 lb

## 2017-09-20 DIAGNOSIS — Z23 Encounter for immunization: Secondary | ICD-10-CM | POA: Diagnosis not present

## 2017-09-20 DIAGNOSIS — N3000 Acute cystitis without hematuria: Secondary | ICD-10-CM | POA: Diagnosis not present

## 2017-09-20 DIAGNOSIS — K219 Gastro-esophageal reflux disease without esophagitis: Secondary | ICD-10-CM | POA: Diagnosis not present

## 2017-09-20 DIAGNOSIS — E119 Type 2 diabetes mellitus without complications: Secondary | ICD-10-CM

## 2017-09-20 LAB — URINALYSIS, ROUTINE W REFLEX MICROSCOPIC
Bilirubin Urine: NEGATIVE
Hgb urine dipstick: NEGATIVE
Ketones, ur: NEGATIVE
Nitrite: NEGATIVE
RBC / HPF: NONE SEEN (ref 0–?)
Specific Gravity, Urine: 1.015 (ref 1.000–1.030)
Total Protein, Urine: NEGATIVE
Urine Glucose: NEGATIVE
Urobilinogen, UA: 0.2 (ref 0.0–1.0)
pH: 7 (ref 5.0–8.0)

## 2017-09-20 LAB — POCT GLYCOSYLATED HEMOGLOBIN (HGB A1C): Hemoglobin A1C: 6.5

## 2017-09-20 MED ORDER — FAMOTIDINE 40 MG PO TABS: 40.0000 mg | ORAL_TABLET | Freq: Every day | ORAL | 2 refills | Status: DC

## 2017-09-20 NOTE — Progress Notes (Signed)
Aaron Drake is a 61 y.o. male is here for follow up.  History of Present Illness:   Aaron Drake CMA acting as scribe for Dr. Juleen China.  HPI: Patient come in today to follow up on his diabetes. Patient is tolerating medication well.   Current symptoms: no polyuria or polydipsia, no chest pain, dyspnea or TIA's, no numbness, tingling or pain in extremities.  Taking medication compliantly without noted sided effects [x]   YES  []   NO   Episodes of hypoglycemia? []   YES  [x]   NO Maintaining a diabetic diet? [x]   YES  []   NO Trying to exercise on a regular basis? []   YES  [x]   NO  Lab Results  Component Value Date   HGBA1C 6.5 09/20/2017    Lab Results  Component Value Date   MICROALBUR <0.7 11/11/2016    Lab Results  Component Value Date   CHOL 205 (H) 11/11/2016   HDL 62.70 11/11/2016   LDLCALC 108 (H) 11/11/2016   LDLDIRECT 136.6 01/11/2014   TRIG 168.0 (H) 11/11/2016   CHOLHDL 3 11/11/2016     Wt Readings from Last 3 Encounters:  09/20/17 222 lb 9.6 oz (101 kg)  08/08/17 222 lb 6.4 oz (100.9 kg)  06/20/17 227 lb 9.6 oz (103.2 kg)   BP Readings from Last 3 Encounters:  09/20/17 122/68  08/30/17 140/84  08/08/17 122/78   Lab Results  Component Value Date   CREATININE 1.02 06/06/2017   Health Maintenance Due  Topic Date Due  . Hepatitis C Screening  May 03, 1956  . OPHTHALMOLOGY EXAM  10/01/1964  . HIV Screening  10/01/1969  . PNEUMOCOCCAL POLYSACCHARIDE VACCINE (2) 01/29/2016  . FOOT EXAM  01/29/2016   Depression screen [facility] 2/9 03/21/2017 11/11/2016 01/29/2015  Decreased Interest 0 0 0  Down, Depressed, Hopeless 0 1 0  PHQ - 2 Score 0 1 0   PMHx, SurgHx, SocialHx, FamHx, Medications, and Allergies were reviewed in the Visit Navigator and updated as appropriate.   Patient Active Problem List   Diagnosis Date Noted  . Positive colorectal cancer screening using Cologuard test 04/07/2017  . Morbid obesity ([place]) 03/26/2017  . Type 2 diabetes mellitus without  complication, without long-term current use of insulin ([place]) 03/26/2017  . Effusion of right knee 03/14/2017  . Left rotator cuff tear arthropathy 01/28/2014  . BPH (benign prostatic hyperplasia) 05/19/2012  . Hyperlipidemia   . Asthma 05/12/2012   Social History  Substance Use Topics  . Smoking status: Former Research scientist (life sciences)  . Smokeless tobacco: Never Used  . Alcohol use 0.6 oz/week    1 Shots of liquor per week     Comment: rare   Current Medications and Allergies:   .  albuterol (PROAIR HFA) 108 (90 Base) MCG/ACT inhaler, Inhale 2 puffs into the lungs every 4 (four) hours as needed for wheezing or shortness of breath. .  beclomethasone (QVAR) 80 MCG/ACT inhaler, Inhale 2 puffs into the lungs daily., Disp: 3 Inhaler, Rfl: 1 .  Benralizumab (FASENRA) 30 MG/ML SOSY, Inject into the skin., Disp: , Rfl:  .  Calcium Carb-Cholecalciferol (CALCIUM + D3 PO), Take 1 tablet by mouth daily., Disp: , Rfl:  .  Cholecalciferol (VITAMIN D-3 PO), Take 1 tablet by mouth daily., Disp: , Rfl:  .  CRANBERRY PO, Take 2-3 capsules by mouth daily., Disp: , Rfl:  .  dutasteride (AVODART) 0.5 MG capsule, Take 1 capsule (0.5 mg total) by mouth daily., Disp: 90 capsule, Rfl: 3 .  Glucos-Chondroit-Hyaluron-MSM (GLUCOSAMINE  CHONDROITIN JOINT) TABS, Take 1 tablet by mouth daily., Disp: , Rfl:  .  Insulin Pen Needle 31G X 6 MM MISC, Use daily on Victoza.  Dx:  E11.9, Disp: 30 each, Rfl: 11 .  Ipratropium-Albuterol (COMBIVENT RESPIMAT) 20-100 MCG/ACT AERS respimat, USE 1 INHALATION ORALLY    EVERY 6 HOURS, Disp: 12 g, Rfl: 3 .  loratadine (CLARITIN) 10 MG tablet, Take 10 mg by mouth daily. , Disp: , Rfl:  .  montelukast (SINGULAIR) 10 MG tablet, Take 1 tablet (10 mg total) by mouth daily., Disp: 90 tablet, Rfl: 1 .  Multiple Vitamin (MULTIVITAMIN WITH MINERALS) TABS tablet, Take 1 tablet by mouth daily., Disp: , Rfl:  .  naproxen sodium (ALEVE) 220 MG tablet, Take 220 mg by mouth every 8 (eight) hours as needed (For  pain.)., Disp: , Rfl:  .  OMEPRAZOLE PO, Take 20 mg by mouth. , Disp: , Rfl:  .  TURMERIC PO, Take by mouth daily., Disp: , Rfl:  .  VICTOZA 18 MG/3ML SOPN, INJECT 0.3 MLS (1.8 MG TOTAL) INTO THE SKIN DAILY., Disp: 9 pen, Rfl: 0  Allergies  Allergen Reactions  . Lipitor [Atorvastatin] Other (See Comments)    Muscle cramps and joint ache   Review of Systems   Pertinent items are noted in the HPI. Otherwise, ROS is negative.  Vitals:   Vitals:   09/20/17 1015  BP: 122/68  Pulse: 92  Temp: 98.2 F (36.8 C)  TempSrc: Oral  SpO2: 95%  Weight: 222 lb 9.6 oz (101 kg)  Height: 5\' 4"  (1.626 m)     Body mass index is 38.21 kg/m.   Physical Exam:   Physical Exam  Constitutional: He is oriented to person, place, and time. He appears well-developed and well-nourished. No distress.  HENT:  Head: Normocephalic and atraumatic.  Right Ear: External ear normal.  Left Ear: External ear normal.  Nose: Nose normal.  Mouth/Throat: Oropharynx is clear and moist.  Eyes: Pupils are equal, round, and reactive to light. Conjunctivae and EOM are normal.  Neck: Normal range of motion. Neck supple.  Cardiovascular: Normal rate, regular rhythm, normal heart sounds and intact distal pulses.   Pulmonary/Chest: Effort normal and breath sounds normal.  Abdominal: Soft. Bowel sounds are normal.  Musculoskeletal: Normal range of motion.  Neurological: He is alert and oriented to person, place, and time.  Skin: Skin is warm and dry.  Psychiatric: He has a normal mood and affect. His behavior is normal. Judgment and thought content normal.  Nursing note and vitals reviewed.  Diabetic Foot Exam - Simple   Simple Foot Form Diabetic Foot exam was performed with the following findings:  Yes 09/23/2017 11:00 PM  Visual Inspection No deformities, no ulcerations, no other skin breakdown bilaterally:  Yes Sensation Testing Intact to touch and monofilament testing bilaterally:  Yes Pulse Check Posterior  Tibialis and Dorsalis pulse intact bilaterally:  Yes Comments    Results for orders placed or performed in visit on 09/20/17  Urine Culture  Result Value Ref Range   MICRO NUMBER: [id number]    SPECIMEN QUALITY: ADEQUATE    Sample Source NOT GIVEN    STATUS: FINAL    ISOLATE 1: Streptococcus, viridans group (A)   Urinalysis, Routine w reflex microscopic  Result Value Ref Range   Color, Urine YELLOW Yellow;Lt. Yellow   APPearance CLEAR Clear   Specific Gravity, Urine 1.015 1.000 - 1.030   pH 7.0 5.0 - 8.0   Total Protein, Urine NEGATIVE Negative  Urine Glucose NEGATIVE Negative   Ketones, ur NEGATIVE Negative   Bilirubin Urine NEGATIVE Negative   Hgb urine dipstick NEGATIVE Negative   Urobilinogen, UA 0.2 0.0 - 1.0   Leukocytes, UA TRACE (A) Negative   Nitrite NEGATIVE Negative   WBC, UA 3-6/hpf (A) 0-2/hpf   RBC / HPF none seen 0-2/hpf   Squamous Epithelial / LPF Rare(0-4/hpf) Rare(0-4/hpf)  POCT glycosylated hemoglobin (Hb A1C)  Result Value Ref Range   Hemoglobin A1C 6.5    Assessment and Plan:   Aaron Drake was seen today for follow-up.  Diagnoses and all orders for this visit:  Type 2 diabetes mellitus without complication, without long-term current use of insulin (HCC) Comments: . Orders: -     POCT glycosylated hemoglobin (Hb A1C)  Need for shingles vaccine -     Varicella-zoster vaccine IM (Shingrix)  Acute cystitis without hematuria -     Urinalysis, Routine w reflex microscopic -     Urine Culture -     amoxicillin-clavulanate (AUGMENTIN) 875-125 MG tablet; Take 1 tablet by mouth 2 (two) times daily.  Gastroesophageal reflux disease, esophagitis presence not specified Comments: Patient would like to try to DC PPI but not sure that he can stop medication completely. Okay trial Pecid. Orders: -     famotidine (PEPCID) 40 MG tablet; Take 1 tablet (40 mg total) by mouth daily.    . Reviewed expectations re: course of current medical issues. . Discussed  self-management of symptoms. . Outlined signs and symptoms indicating need for more acute intervention. . Patient verbalized understanding and all questions were answered. Marland Kitchen Health Maintenance issues including appropriate healthy diet, exercise, and smoking avoidance were discussed with patient. . See orders for this visit as documented in the electronic medical record. . Patient received an After Visit Summary.  CMA served as Education administrator during this visit. History, Physical, and Plan performed by medical provider. The above documentation has been reviewed and is accurate and complete. Briscoe Deutscher, D.O.  Briscoe Deutscher, DO [place], [place] 09/23/2017  Future Appointments Date Time Provider [place]  10/25/2017 10:30 AM [facility] NURSE [facility] None  03/21/2018 10:20 AM Briscoe Deutscher, DO [facility] None

## 2017-09-21 DIAGNOSIS — R8290 Unspecified abnormal findings in urine: Secondary | ICD-10-CM | POA: Diagnosis not present

## 2017-09-22 LAB — URINE CULTURE
MICRO NUMBER:: 81190602
SPECIMEN QUALITY:: ADEQUATE

## 2017-09-23 ENCOUNTER — Telehealth: Payer: Self-pay | Admitting: Family Medicine

## 2017-09-23 MED ORDER — AMOXICILLIN-POT CLAVULANATE 875-125 MG PO TABS: 1.0000 | ORAL_TABLET | Freq: Two times a day (BID) | ORAL | 0 refills | Status: DC

## 2017-09-23 NOTE — Telephone Encounter (Signed)
Pt called back checking on his urine culture and to see if he needs a medication. Please advise.

## 2017-09-23 NOTE — Telephone Encounter (Signed)
Can you please look at patients Culture to see what you would like to prescribe.

## 2017-09-23 NOTE — Telephone Encounter (Signed)
Augmentin sent to pharmacy.

## 2017-09-23 NOTE — Telephone Encounter (Signed)
Notified patient that RX has been sent to the pharmacy and that he did have an UTI.

## 2017-09-25 ENCOUNTER — Encounter: Payer: Self-pay | Admitting: Family Medicine

## 2017-09-26 ENCOUNTER — Other Ambulatory Visit: Payer: Self-pay

## 2017-09-26 DIAGNOSIS — N3000 Acute cystitis without hematuria: Secondary | ICD-10-CM

## 2017-09-26 MED ORDER — AMOXICILLIN-POT CLAVULANATE 875-125 MG PO TABS: 1.0000 | ORAL_TABLET | Freq: Two times a day (BID) | ORAL | 0 refills | Status: DC

## 2017-10-25 ENCOUNTER — Ambulatory Visit (INDEPENDENT_AMBULATORY_CARE_PROVIDER_SITE_OTHER): Payer: 59 | Admitting: *Deleted

## 2017-10-25 DIAGNOSIS — J4550 Severe persistent asthma, uncomplicated: Secondary | ICD-10-CM

## 2017-10-25 MED ORDER — BENRALIZUMAB 30 MG/ML ~~LOC~~ SOSY
30.0000 mg | PREFILLED_SYRINGE | SUBCUTANEOUS | Status: DC
  Administered 2017-10-25: 30 mg via SUBCUTANEOUS

## 2017-11-05 ENCOUNTER — Other Ambulatory Visit: Payer: Self-pay | Admitting: Internal Medicine

## 2017-11-19 ENCOUNTER — Other Ambulatory Visit: Payer: Self-pay | Admitting: Family Medicine

## 2017-11-19 DIAGNOSIS — E119 Type 2 diabetes mellitus without complications: Secondary | ICD-10-CM

## 2017-11-25 ENCOUNTER — Encounter: Payer: Self-pay | Admitting: Allergy and Immunology

## 2017-11-28 ENCOUNTER — Other Ambulatory Visit: Payer: Self-pay

## 2017-11-28 ENCOUNTER — Encounter: Payer: Self-pay | Admitting: Family Medicine

## 2017-11-28 ENCOUNTER — Telehealth: Payer: Self-pay

## 2017-11-28 MED ORDER — BECLOMETHASONE DIPROP HFA 80 MCG/ACT IN AERB: INHALATION_SPRAY | RESPIRATORY_TRACT | 0 refills | Status: DC

## 2017-11-28 NOTE — Telephone Encounter (Signed)
Please inform patient that we can try Dupilumab which is home injection every 2 weeks. Please have Tammy initiate approval process.

## 2017-11-28 NOTE — Telephone Encounter (Signed)
-----   Message from [place], Generic sent at 11/25/2017 1:57 PM EST -----    Regarding the Fasenra: I discussed with the nurse who gave the last injection that I was not observing any improvement in my asthma control. I still do not notice any improvement at all. I suspect that it just isn't working for me.    Question: should I continue to plan for the appointment for the next dose coming up in January or wait until I have the Feb. appt. with Dr. Neldon Mc to discuss other options?   Please advise

## 2017-11-30 NOTE — Telephone Encounter (Signed)
Got it will start process 

## 2017-12-13 ENCOUNTER — Encounter: Payer: Self-pay | Admitting: Allergy and Immunology

## 2017-12-15 ENCOUNTER — Encounter: Payer: Self-pay | Admitting: Allergy and Immunology

## 2017-12-15 MED ORDER — OMEPRAZOLE 20 MG PO CPDR: 20.0000 mg | DELAYED_RELEASE_CAPSULE | Freq: Every day | ORAL | 1 refills | Status: DC

## 2017-12-15 MED ORDER — MONTELUKAST SODIUM 10 MG PO TABS: 10.0000 mg | ORAL_TABLET | Freq: Every day | ORAL | 1 refills | Status: DC

## 2017-12-15 NOTE — Telephone Encounter (Signed)
90 day supply of Omeprazole and Singulair sent to [facility] per pt request.

## 2017-12-19 ENCOUNTER — Other Ambulatory Visit: Payer: Self-pay

## 2017-12-19 MED ORDER — MONTELUKAST SODIUM 10 MG PO TABS: 10.0000 mg | ORAL_TABLET | Freq: Every day | ORAL | 0 refills | Status: DC

## 2017-12-20 ENCOUNTER — Ambulatory Visit: Payer: 59

## 2017-12-29 ENCOUNTER — Other Ambulatory Visit: Payer: Self-pay | Admitting: *Deleted

## 2017-12-29 MED ORDER — UMECLIDINIUM-VILANTEROL 62.5-25 MCG/INH IN AEPB: 1.0000 | INHALATION_SPRAY | Freq: Every day | RESPIRATORY_TRACT | 0 refills | Status: DC

## 2017-12-30 ENCOUNTER — Encounter: Payer: Self-pay | Admitting: Allergy and Immunology

## 2018-01-02 ENCOUNTER — Encounter: Payer: Self-pay | Admitting: *Deleted

## 2018-01-03 ENCOUNTER — Ambulatory Visit: Payer: 59 | Admitting: Allergy and Immunology

## 2018-01-03 ENCOUNTER — Other Ambulatory Visit: Payer: Self-pay | Admitting: Allergy and Immunology

## 2018-01-03 ENCOUNTER — Encounter: Payer: Self-pay | Admitting: Allergy and Immunology

## 2018-01-03 VITALS — BP 138/88 | HR 90 | Resp 18

## 2018-01-03 DIAGNOSIS — L2089 Other atopic dermatitis: Secondary | ICD-10-CM

## 2018-01-03 DIAGNOSIS — R635 Abnormal weight gain: Secondary | ICD-10-CM

## 2018-01-03 DIAGNOSIS — D721 Eosinophilia, unspecified: Secondary | ICD-10-CM

## 2018-01-03 DIAGNOSIS — J4550 Severe persistent asthma, uncomplicated: Secondary | ICD-10-CM

## 2018-01-03 DIAGNOSIS — J3089 Other allergic rhinitis: Secondary | ICD-10-CM

## 2018-01-03 NOTE — Progress Notes (Signed)
Follow-up Note  Referring Provider: Briscoe Deutscher, DO Primary Provider: Briscoe Deutscher, DO Date of Office Visit: 01/03/2018  Subjective:   Aaron Drake (DOB: 11/30/1955) is a 62 y.o. male who returns to the [facility] [place] on 01/03/2018 in re-evaluation of the following:  HPI: Ollis returns to this clinic in reevaluation of his severe asthma, allergic rhinitis, atopic dermatitis and weight gain.  He was last seen in this clinic 30 August 2017.  His previous benralizumab injections did not really help him very much even though he has documented eosinophilia.  We have been granted approval to start him on dupilumab and he will be starting this form of biological agent tomorrow.  He still continues to have daily use of a bronchodilator and wheezing and coughing on a pretty regular basis even in the face of utilizing a combination of anticholinergic agent, and inhaled steroids as well at a leukotriene modifier.  He has not required a systemic steroid to treat an exacerbation of asthma since I have seen him in October.  He does relate a history of having reflux which he believes is under pretty good control at this point in time while using omeprazole.  For the most part his nose is doing relatively well.  His skin has been under excellent control as well.  He has had significant weight gain over the course of the past year.  He did start Victoza for his type 2 diabetes in August 2018.  Allergies as of 01/03/2018      Reactions   Lipitor [atorvastatin] Other (See Comments)   Muscle cramps and joint ache      Medication List      albuterol 108 (90 Base) MCG/ACT inhaler Commonly known as:  PROAIR HFA Inhale 2 puffs into the lungs every 4 (four) hours as needed for wheezing or shortness of breath.   ALEVE 220 MG tablet Generic drug:  naproxen sodium Take 220 mg by mouth every 8 (eight) hours as needed (For pain.).   beclomethasone 80 MCG/ACT inhaler Commonly known  as:  QVAR REDIHALER Inhale two doses twice daily to prevent cough or wheeze.  Rinse, gargle, and spit after use.   CALCIUM + D3 PO Take 1 tablet by mouth daily.   CRANBERRY PO Take 2-3 capsules by mouth daily.   dutasteride 0.5 MG capsule Commonly known as:  AVODART Take 1 capsule (0.5 mg total) by mouth daily.   famotidine 40 MG tablet Commonly known as:  PEPCID Take 1 tablet (40 mg total) by mouth daily.   GLUCOSAMINE CHONDROITIN JOINT Tabs Take 1 tablet by mouth daily.   Insulin Pen Needle 31G X 6 MM Misc Use daily on Victoza.  Dx:  E11.9   Ipratropium-Albuterol 20-100 MCG/ACT Aers respimat Commonly known as:  COMBIVENT RESPIMAT USE 1 INHALATION ORALLY    EVERY 6 HOURS   loratadine 10 MG tablet Commonly known as:  CLARITIN Take 10 mg by mouth daily.   montelukast 10 MG tablet Commonly known as:  SINGULAIR Take 1 tablet (10 mg total) by mouth daily.   multivitamin with minerals Tabs tablet Take 1 tablet by mouth daily.   omeprazole 20 MG capsule Commonly known as:  PRILOSEC Take 1 capsule (20 mg total) by mouth daily.   TURMERIC PO Take by mouth daily.   umeclidinium-vilanterol 62.5-25 MCG/INH Aepb Commonly known as:  ANORO ELLIPTA Inhale 1 puff into the lungs daily.   VICTOZA 18 MG/3ML Sopn Generic drug:  liraglutide INJECT 0.3  MLS (1.8 MG TOTAL) INTO THE SKIN DAILY.   VITAMIN D-3 PO Take 1 tablet by mouth daily.       Past Medical History:  Diagnosis Date  . Allergy   . Arthritis   . Asthma 05/12/2012  . BPH (benign prostatic hyperplasia) 05/19/2012  . History of kidney stones   . Hyperlipidemia   . Impaired glucose tolerance 05/19/2012  . Tear of meniscus of right knee 2018   partial tear    Past Surgical History:  Procedure Laterality Date  . CATARACT EXTRACTION Right   . INSERTION OF MESH N/A 05/17/2016   Procedure: INSERTION OF MESH;  Surgeon: Rolm Bookbinder, MD;  Location: [facility];  Service: General;  Laterality: N/A;  . UMBILICAL  HERNIA REPAIR N/A 05/17/2016   Procedure: UMBILICAL HERNIA REPAIR;  Surgeon: Rolm Bookbinder, MD;  Location: [facility];  Service: General;  Laterality: N/A;  . WISDOM TOOTH EXTRACTION      Review of systems negative except as noted in HPI / PMHx or noted below:  Review of Systems  Constitutional: Negative.   HENT: Negative.   Eyes: Negative.   Respiratory: Negative.   Cardiovascular: Negative.   Gastrointestinal: Negative.   Genitourinary: Negative.   Musculoskeletal: Negative.   Skin: Negative.   Neurological: Negative.   Endo/Heme/Allergies: Negative.   Psychiatric/Behavioral: Negative.      Objective:   Vitals:   01/03/18 1036  BP: 138/88  Pulse: 90  Resp: 18  SpO2: 95%          Physical Exam  Constitutional: He is well-developed, well-nourished, and in no distress.  HENT:  Head: Normocephalic.  Right Ear: Tympanic membrane, external ear and ear canal normal.  Left Ear: Tympanic membrane, external ear and ear canal normal.  Nose: Nose normal. No mucosal edema or rhinorrhea.  Mouth/Throat: Uvula is midline, oropharynx is clear and moist and mucous membranes are normal. No oropharyngeal exudate.  Eyes: Conjunctivae are normal.  Neck: Trachea normal. No tracheal tenderness present. No tracheal deviation present. No thyromegaly present.  Cardiovascular: Normal rate, regular rhythm, S1 normal, S2 normal and normal heart sounds.  No murmur heard. Pulmonary/Chest: Breath sounds normal. No stridor. No respiratory distress. He has no wheezes. He has no rales.  Musculoskeletal: He exhibits no edema.  Lymphadenopathy:       Head (right side): No tonsillar adenopathy present.       Head (left side): No tonsillar adenopathy present.    He has no cervical adenopathy.  Neurological: He is alert. Gait normal.  Skin: No rash noted. He is not diaphoretic. No erythema. Nails show no clubbing.  Psychiatric: Mood and affect normal.    Diagnostics:    Spirometry was performed  and demonstrated an FEV1 of 1.87 at 67 % of predicted.  The patient had an Asthma Control Test with the following results: ACT Total Score: 8.    Review of blood tests obtained 06 June 2017 identifies normal hepatic and renal function with a slight elevation in ALT at 54U/L, WBC 8.9, eosinophilia at 600, hemoglobin 13.8, platelet 274, hemoglobin A1c 7.9%  Assessment and Plan:   1. Not well controlled severe persistent asthma   2. Other allergic rhinitis   3. Other atopic dermatitis   4. Eosinophilia   5. Weight gain     1. Continue a combination of:   A. Qvar 80 Redihaler - 2 inhalations twice a day  B. Anoro - one inhalation 1 time per day  C. montelukast 10 mg daily  2. Continue Claritin and omeprazole and ProAir HFA and topical treatment if needed  3. Start dupilumab this week  4. Return to clinic in 12 weeks or earlier if problem  5. Blood - TSH, T4, TP  Monte will be starting dupilumab this week and hopefully he will receive benefit from this anti-IL 4 / IL-13 biological agent.  He will continue to use therapy directed against respiratory tract inflammation and reflux as noted above.  His previous blood tests did not identify any significant abnormality that may contribute to weight gain but to be complete we will check his thyroid status.  I will see him back in this clinic in 12 weeks or earlier if there is a problem.  Allena Katz, MD Allergy / Immunology [place]

## 2018-01-03 NOTE — Patient Instructions (Addendum)
  1. Continue a combination of:   A. Qvar 80 Redihaler - 2 inhalations twice a day  B. Anoro - one inhalation 1 time per day  C. montelukast 10 mg daily  2. Continue Claritin and omeprazole and ProAir HFA and topical treatment if needed  3. Start dupilumab this week  4. Return to clinic in 12 weeks or earlier if problem  5. Blood - TSH, T4, TP

## 2018-01-04 ENCOUNTER — Encounter: Payer: Self-pay | Admitting: Allergy and Immunology

## 2018-01-04 ENCOUNTER — Ambulatory Visit (INDEPENDENT_AMBULATORY_CARE_PROVIDER_SITE_OTHER): Payer: 59

## 2018-01-04 DIAGNOSIS — L209 Atopic dermatitis, unspecified: Secondary | ICD-10-CM

## 2018-01-04 LAB — THYROID PEROXIDASE ANTIBODY: THYROID PEROXIDASE ANTIBODY: 11 [IU]/mL (ref 0–34)

## 2018-01-04 LAB — TSH: TSH: 1.44 u[IU]/mL (ref 0.450–4.500)

## 2018-01-04 LAB — T4, FREE: Free T4: 1.03 ng/dL (ref 0.82–1.77)

## 2018-01-04 NOTE — Progress Notes (Signed)
Immunotherapy   Patient Details  Name: Aaron Drake MRN: [id number] Date of Birth: 02/24/1956  01/04/2018 Iline Oven started injections for  Dupixent 300mg /92ml.  Frequency: Every 28 days. Epi-Pen:Epi-Pen Available  Consent signed and patient instructions given.  Pt was given a demonstration of how to give himself Dupixent in his left upper thigh. He returned the demonstration by giving himself an injection of 300 mg/60ml in his right upper thigh. He acknowledged and had no further questions. Pt will be giving himself his next dose of 300 mg/77ml in 14 days. Pt is aware that he is responsible for reordering his medication. He waited for 15 minutes without issues.   Lot: [id number] Exp:02/2019   Gara Kroner 01/04/2018, 10:19 AM

## 2018-01-09 ENCOUNTER — Other Ambulatory Visit: Payer: Self-pay | Admitting: Allergy and Immunology

## 2018-01-09 ENCOUNTER — Telehealth: Payer: Self-pay

## 2018-01-09 ENCOUNTER — Encounter: Payer: Self-pay | Admitting: *Deleted

## 2018-01-09 NOTE — Telephone Encounter (Signed)
Patient called back stating that he received an e-mail in regards to his lab results. I went over his lab results and he informed me that he believes we have wrong number and we did. Number has been updated.

## 2018-01-16 ENCOUNTER — Other Ambulatory Visit: Payer: Self-pay

## 2018-01-16 MED ORDER — ALBUTEROL SULFATE HFA 108 (90 BASE) MCG/ACT IN AERS: 2.0000 | INHALATION_SPRAY | RESPIRATORY_TRACT | 0 refills | Status: DC | PRN

## 2018-01-16 NOTE — Telephone Encounter (Signed)
Received fax for refill on Proair. Patient was last seen 01/03/2018. Request sent in.

## 2018-02-01 ENCOUNTER — Other Ambulatory Visit: Payer: Self-pay | Admitting: Allergy and Immunology

## 2018-03-14 ENCOUNTER — Ambulatory Visit: Payer: 59 | Admitting: Allergy and Immunology

## 2018-03-19 ENCOUNTER — Other Ambulatory Visit: Payer: Self-pay | Admitting: Family Medicine

## 2018-03-19 DIAGNOSIS — E119 Type 2 diabetes mellitus without complications: Secondary | ICD-10-CM

## 2018-03-21 ENCOUNTER — Ambulatory Visit: Payer: 59 | Admitting: Allergy and Immunology

## 2018-03-21 ENCOUNTER — Ambulatory Visit: Payer: 59 | Admitting: Family Medicine

## 2018-03-21 ENCOUNTER — Other Ambulatory Visit: Payer: Self-pay

## 2018-03-21 ENCOUNTER — Other Ambulatory Visit: Payer: Self-pay | Admitting: Family Medicine

## 2018-03-21 ENCOUNTER — Encounter: Payer: Self-pay | Admitting: Allergy and Immunology

## 2018-03-21 VITALS — BP 128/74 | HR 96 | Resp 20

## 2018-03-21 DIAGNOSIS — J3089 Other allergic rhinitis: Secondary | ICD-10-CM | POA: Diagnosis not present

## 2018-03-21 DIAGNOSIS — L2089 Other atopic dermatitis: Secondary | ICD-10-CM

## 2018-03-21 DIAGNOSIS — K219 Gastro-esophageal reflux disease without esophagitis: Secondary | ICD-10-CM

## 2018-03-21 DIAGNOSIS — J4550 Severe persistent asthma, uncomplicated: Secondary | ICD-10-CM

## 2018-03-21 DIAGNOSIS — R635 Abnormal weight gain: Secondary | ICD-10-CM

## 2018-03-21 MED ORDER — UMECLIDINIUM-VILANTEROL 62.5-25 MCG/INH IN AEPB: 1.0000 | INHALATION_SPRAY | Freq: Every day | RESPIRATORY_TRACT | 5 refills | Status: DC

## 2018-03-21 NOTE — Patient Instructions (Addendum)
  1. Continue a combination of:   A. Qvar 80 Redihaler - 2 inhalations twice a day.   BJearl Klinefelter - one inhalation 1 time per day  C. montelukast 10 mg daily  D. Dupilumab injections every 2 weeks  2. Continue Claritin and omeprazole and ProAir HFA and topical treatment if needed  3.  Increase Qvar to 3 inhalations 3 times per day as part of "action plan" for flareup  4. Return to clinic in 6 months or earlier if problem

## 2018-03-21 NOTE — Progress Notes (Signed)
Follow-up Note  Referring Provider: Briscoe Deutscher, DO Primary Provider: Briscoe Deutscher, DO Date of Office Visit: 03/21/2018  Subjective:   Aaron Drake (DOB: 05-05-1956) is a 62 y.o. male who returns to the [facility] [place] on 03/21/2018 in re-evaluation of the following:  HPI: Aaron Drake returns to this clinic in reevaluation of his severe asthma, allergic rhinitis, atopic dermatitis, reflux, and history of weight gain.  His last visit to this clinic was 03 January 2018 at which point in time he started dupilumab injections.  Within days of starting dupilumab he noticed a dramatic improvement regarding his chronic lower respiratory tract symptoms with a decrease in wheezing and coughing and a dramatic decrease in his requirement for a bronchodilator.  Previous to dupilumab he was using his bronchodilator about 5 times per day and now he uses it 1 time per day or less.  He is not having any problems with reflux at this point while using omeprazole.  His nose is doing quite well at this point in time.  His skin is doing well.  Allergies as of 03/21/2018      Reactions   Lipitor [atorvastatin] Other (See Comments)   Muscle cramps and joint ache      Medication List      albuterol 108 (90 Base) MCG/ACT inhaler Commonly known as:  PROAIR HFA Inhale 2 puffs into the lungs every 4 (four) hours as needed for wheezing or shortness of breath.   ALEVE 220 MG tablet Generic drug:  naproxen sodium Take 220 mg by mouth every 8 (eight) hours as needed (For pain.).   beclomethasone 80 MCG/ACT inhaler Commonly known as:  QVAR REDIHALER Inhale two doses twice daily to prevent cough or wheeze.  Rinse, gargle, and spit after use.   CALCIUM + D3 PO Take 1 tablet by mouth daily.   COMBIVENT RESPIMAT 20-100 MCG/ACT Aers respimat Generic drug:  Ipratropium-Albuterol USE 1 INHALATION ORALLY    EVERY 6 HOURS   CRANBERRY PO Take 2-3 capsules by mouth daily.   DUPIXENT 300 MG/2ML  Sosy Generic drug:  Dupilumab INJECT 2 SYRINGES SUBCUTANEOUSLY ON DAY 1, THEN INJECT 1 SYRINGE ON DAY 15 , THEN 1 SYRINGE EVERY OTHER WEEK THEREAFTER.   dutasteride 0.5 MG capsule Commonly known as:  AVODART Take 1 capsule (0.5 mg total) by mouth daily.   GLUCOSAMINE CHONDROITIN JOINT Tabs Take 1 tablet by mouth daily.   Insulin Pen Needle 31G X 6 MM Misc Use daily on Victoza.  Dx:  E11.9   loratadine 10 MG tablet Commonly known as:  CLARITIN Take 10 mg by mouth daily.   montelukast 10 MG tablet Commonly known as:  SINGULAIR Take 1 tablet (10 mg total) by mouth daily.   multivitamin with minerals Tabs tablet Take 1 tablet by mouth daily.   omeprazole 20 MG capsule Commonly known as:  PRILOSEC Take 1 capsule (20 mg total) by mouth daily.   TURMERIC PO Take by mouth daily.   umeclidinium-vilanterol 62.5-25 MCG/INH Aepb Commonly known as:  ANORO ELLIPTA Inhale 1 puff into the lungs daily.   VICTOZA 18 MG/3ML Sopn Generic drug:  liraglutide INJECT 0.3 MLS (1.8 MG TOTAL) INTO THE SKIN DAILY.   VITAMIN D-3 PO Take 1 tablet by mouth daily.       Past Medical History:  Diagnosis Date  . Allergy   . Arthritis   . Asthma 05/12/2012  . BPH (benign prostatic hyperplasia) 05/19/2012  . History of kidney stones   .  Hyperlipidemia   . Impaired glucose tolerance 05/19/2012  . Tear of meniscus of right knee 2018   partial tear    Past Surgical History:  Procedure Laterality Date  . CATARACT EXTRACTION Right   . INSERTION OF MESH N/A 05/17/2016   Procedure: INSERTION OF MESH;  Surgeon: Rolm Bookbinder, MD;  Location: [facility];  Service: General;  Laterality: N/A;  . UMBILICAL HERNIA REPAIR N/A 05/17/2016   Procedure: UMBILICAL HERNIA REPAIR;  Surgeon: Rolm Bookbinder, MD;  Location: [facility];  Service: General;  Laterality: N/A;  . WISDOM TOOTH EXTRACTION      Review of systems negative except as noted in HPI / PMHx or noted below:  Review of Systems  Constitutional:  Negative.   HENT: Negative.   Eyes: Negative.   Respiratory: Negative.   Cardiovascular: Negative.   Gastrointestinal: Negative.   Genitourinary: Negative.   Musculoskeletal: Negative.   Skin: Negative.   Neurological: Negative.   Endo/Heme/Allergies: Negative.   Psychiatric/Behavioral: Negative.      Objective:   Vitals:   03/21/18 0915  BP: 128/74  Pulse: 96  Resp: 20          Physical Exam  HENT:  Head: Normocephalic.  Right Ear: Tympanic membrane, external ear and ear canal normal.  Left Ear: Tympanic membrane, external ear and ear canal normal.  Nose: Nose normal. No mucosal edema or rhinorrhea.  Mouth/Throat: Uvula is midline, oropharynx is clear and moist and mucous membranes are normal. No oropharyngeal exudate.  Eyes: Conjunctivae are normal.  Neck: Trachea normal. No tracheal tenderness present. No tracheal deviation present. No thyromegaly present.  Cardiovascular: Normal rate, regular rhythm, S1 normal, S2 normal and normal heart sounds.  No murmur heard. Pulmonary/Chest: Breath sounds normal. No stridor. No respiratory distress. He has no wheezes. He has no rales.  Musculoskeletal: He exhibits no edema.  Lymphadenopathy:       Head (right side): No tonsillar adenopathy present.       Head (left side): No tonsillar adenopathy present.    He has no cervical adenopathy.  Neurological: He is alert.  Skin: No rash noted. He is not diaphoretic. No erythema. Nails show no clubbing.    Diagnostics:    Spirometry was performed and demonstrated an FEV1 of 1.89 at 68 % of predicted.  The patient had an Asthma Control Test with the following results: ACT Total Score: 17.    Results of blood tests obtained 03 January 2018 identified TSH 1.440 iU/mL, T4 1.03 NG/DL, thyroid peroxidase antibody 11 IU/mL  Assessment and Plan:   1. Asthma, severe persistent, well-controlled   2. Other allergic rhinitis   3. Other atopic dermatitis   4. Gastroesophageal reflux  disease, esophagitis presence not specified   5. Weight gain     1. Continue a combination of:   A. Qvar 80 Redihaler - 2 inhalations twice a day.   BJearl Klinefelter - one inhalation 1 time per day  C. montelukast 10 mg daily  D. Dupilumab injections every 2 weeks  2. Continue Claritin and omeprazole and ProAir HFA and topical treatment if needed  3.  Increase Qvar to 3 inhalations 3 times per day as part of "action plan" for flareup  4. Return to clinic in 6 months or earlier if problem  Aaron Drake appears to be doing much better on dupilumab and we will continue to have him use these injections every 2 weeks at home in addition to other therapy directed against respiratory tract inflammation and reflux and  see him back in this clinic in 6 months or earlier if there is a problem.  Allena Katz, MD Allergy / Immunology [place]

## 2018-03-22 ENCOUNTER — Encounter: Payer: Self-pay | Admitting: Allergy and Immunology

## 2018-04-03 ENCOUNTER — Ambulatory Visit: Payer: 59 | Admitting: Family Medicine

## 2018-04-03 ENCOUNTER — Encounter: Payer: Self-pay | Admitting: Family Medicine

## 2018-04-03 VITALS — BP 110/66 | HR 84 | Temp 98.2°F | Ht 64.0 in | Wt 227.4 lb

## 2018-04-03 DIAGNOSIS — E119 Type 2 diabetes mellitus without complications: Secondary | ICD-10-CM | POA: Diagnosis not present

## 2018-04-03 LAB — POCT GLYCOSYLATED HEMOGLOBIN (HGB A1C): Hemoglobin A1C: 6.6

## 2018-04-03 MED ORDER — SEMAGLUTIDE (1 MG/DOSE) 2 MG/1.5ML ~~LOC~~ SOPN: 1.5000 mg | PEN_INJECTOR | SUBCUTANEOUS | 6 refills | Status: DC

## 2018-04-03 NOTE — Progress Notes (Signed)
Aaron Drake is a 62 y.o. male is here for follow up.  History of Present Illness:   HPI:   1. Type 2 diabetes mellitus without complication, without long-term current use of insulin ([place]).   Current symptoms: no polyuria or polydipsia, no chest pain, dyspnea or TIA's, no numbness, tingling or pain in extremities.   Lab Results  Component Value Date   HGBA1C 6.6 04/03/2018    Lab Results  Component Value Date   MICROALBUR <0.7 11/11/2016    Lab Results  Component Value Date   CHOL 205 (H) 11/11/2016   HDL 62.70 11/11/2016   LDLCALC 108 (H) 11/11/2016   LDLDIRECT 136.6 01/11/2014   TRIG 168.0 (H) 11/11/2016   CHOLHDL 3 11/11/2016     Wt Readings from Last 3 Encounters:  04/03/18 227 lb 6.4 oz (103.1 kg)  09/20/17 222 lb 9.6 oz (101 kg)  08/08/17 222 lb 6.4 oz (100.9 kg)   BP Readings from Last 3 Encounters:  04/03/18 110/66  03/21/18 128/74  01/03/18 138/88   Lab Results  Component Value Date   CREATININE 1.02 06/06/2017     2. Morbid obesity ([place]).   Wt Readings from Last 3 Encounters:  04/03/18 227 lb 6.4 oz (103.1 kg)  09/20/17 222 lb 9.6 oz (101 kg)  08/08/17 222 lb 6.4 oz (100.9 kg)     Health Maintenance Due  Topic Date Due  . Hepatitis C Screening  1956-11-11  . OPHTHALMOLOGY EXAM  10/01/1964  . HIV Screening  10/01/1969  . PNEUMOCOCCAL POLYSACCHARIDE VACCINE (2) 01/29/2016  . URINE MICROALBUMIN  11/11/2017  . HEMOGLOBIN A1C  03/21/2018   Depression screen PHQ 2/9 04/03/2018 03/21/2017 11/11/2016  Decreased Interest 0 0 0  Down, Depressed, Hopeless 0 0 1  PHQ - 2 Score 0 0 1   PMHx, SurgHx, SocialHx, FamHx, Medications, and Allergies were reviewed in the Visit Navigator and updated as appropriate.   Patient Active Problem List   Diagnosis Date Noted  . Positive colorectal cancer screening using Cologuard test 04/07/2017  . Morbid obesity ([place]) 03/26/2017  . Type 2 diabetes mellitus without complication, without long-term current use of  insulin ([place]) 03/26/2017  . Effusion of right knee 03/14/2017  . Left rotator cuff tear arthropathy 01/28/2014  . BPH (benign prostatic hyperplasia) 05/19/2012  . Hyperlipidemia   . Asthma 05/12/2012   Social History   Tobacco Use  . Smoking status: Former Research scientist (life sciences)  . Smokeless tobacco: Never Used  Substance Use Topics  . Alcohol use: Yes    Alcohol/week: 0.6 oz    Types: 1 Shots of liquor per week    Comment: rare  . Drug use: No   Current Medications and Allergies:   Current Outpatient Medications:  .  albuterol (PROAIR HFA) 108 (90 Base) MCG/ACT inhaler, Inhale 2 puffs into the lungs every 4 (four) hours as needed for wheezing or shortness of breath., Disp: 3 Inhaler, Rfl: 0 .  beclomethasone (QVAR REDIHALER) 80 MCG/ACT inhaler, Inhale two doses twice daily to prevent cough or wheeze.  Rinse, gargle, and spit after use., Disp: 3 Inhaler, Rfl: 0 .  Calcium Carb-Cholecalciferol (CALCIUM + D3 PO), Take 1 tablet by mouth daily., Disp: , Rfl:  .  Cholecalciferol (VITAMIN D-3 PO), Take 1 tablet by mouth daily., Disp: , Rfl:  .  COMBIVENT RESPIMAT 20-100 MCG/ACT AERS respimat, USE 1 INHALATION ORALLY    EVERY 6 HOURS, Disp: 12 g, Rfl: 3 .  CRANBERRY PO, Take 2-3 capsules by mouth  daily., Disp: , Rfl:  .  DUPIXENT 300 MG/2ML SOSY, INJECT 2 SYRINGES SUBCUTANEOUSLY ON DAY 1, THEN INJECT 1 SYRINGE ON DAY 15 , THEN 1 SYRINGE EVERY OTHER WEEK THEREAFTER., Disp: 2 Syringe, Rfl: 11 .  dutasteride (AVODART) 0.5 MG capsule, Take 1 capsule (0.5 mg total) by mouth daily., Disp: 90 capsule, Rfl: 3 .  Glucos-Chondroit-Hyaluron-MSM (GLUCOSAMINE CHONDROITIN JOINT) TABS, Take 1 tablet by mouth daily., Disp: , Rfl:  .  Insulin Pen Needle 31G X 6 MM MISC, Use daily on Victoza.  Dx:  E11.9, Disp: 30 each, Rfl: 11 .  levocetirizine (XYZAL) 5 MG tablet, Take 5 mg by mouth every evening., Disp: , Rfl:  .  montelukast (SINGULAIR) 10 MG tablet, Take 1 tablet (10 mg total) by mouth daily., Disp: 90 tablet, Rfl:  0 .  Multiple Vitamin (MULTIVITAMIN WITH MINERALS) TABS tablet, Take 1 tablet by mouth daily., Disp: , Rfl:  .  naproxen sodium (ALEVE) 220 MG tablet, Take 220 mg by mouth every 8 (eight) hours as needed (For pain.)., Disp: , Rfl:  .  omeprazole (PRILOSEC) 20 MG capsule, Take 1 capsule (20 mg total) by mouth daily., Disp: 90 capsule, Rfl: 1 .  TURMERIC PO, Take by mouth daily., Disp: , Rfl:  .  umeclidinium-vilanterol (ANORO ELLIPTA) 62.5-25 MCG/INH AEPB, Inhale 1 puff into the lungs daily., Disp: 84 each, Rfl: 5 .  VICTOZA 18 MG/3ML SOPN, INJECT 0.3 MLS (1.8 MG TOTAL) INTO THE SKIN DAILY., Disp: 9 pen, Rfl: 3    Allergies  Allergen Reactions  . Lipitor [Atorvastatin] Other (See Comments)    Muscle cramps and joint ache   Review of Systems   Pertinent items are noted in the HPI. Otherwise, ROS is negative.  Vitals:   Vitals:   04/03/18 1002  BP: 110/66  Pulse: 84  Temp: 98.2 F (36.8 C)  TempSrc: Oral  SpO2: 96%  Weight: 227 lb 6.4 oz (103.1 kg)  Height: 5\' 4"  (1.626 m)     Body mass index is 39.03 kg/m.   Physical Exam:   Physical Exam  Constitutional: He is oriented to person, place, and time. He appears well-developed and well-nourished. No distress.  HENT:  Head: Normocephalic and atraumatic.  Right Ear: External ear normal.  Left Ear: External ear normal.  Nose: Nose normal.  Mouth/Throat: Oropharynx is clear and moist.  Eyes: Pupils are equal, round, and reactive to light. Conjunctivae and EOM are normal.  Neck: Normal range of motion. Neck supple.  Cardiovascular: Normal rate, regular rhythm, normal heart sounds and intact distal pulses.  Pulmonary/Chest: Effort normal and breath sounds normal.  Abdominal: Soft. Bowel sounds are normal.  Musculoskeletal: Normal range of motion.  Neurological: He is alert and oriented to person, place, and time.  Skin: Skin is warm and dry.  Psychiatric: He has a normal mood and affect. His behavior is normal. Judgment and  thought content normal.  Nursing note and vitals reviewed.   Results for orders placed or performed in visit on 04/03/18  POCT glycosylated hemoglobin (Hb A1C)  Result Value Ref Range   Hemoglobin A1C 6.6    Assessment and Plan:   Tamer was seen today for follow-up.  Diagnoses and all orders for this visit:  Type 2 diabetes mellitus without complication, without long-term current use of insulin ([place]) Comments: Will change Victoza to Ozempic.  Will hopefully obtain more weight loss and control blood sugars. Orders: -     POCT glycosylated hemoglobin (Hb A1C) -  Semaglutide (OZEMPIC) 1 MG/DOSE SOPN; Inject 1.5 mg into the skin once a week.  Morbid obesity ([place]) Comments: We discussed the importance of exercise and healthy food choices.    . Reviewed expectations re: course of current medical issues. . Discussed self-management of symptoms. . Outlined signs and symptoms indicating need for more acute intervention. . Patient verbalized understanding and all questions were answered. Marland Kitchen Health Maintenance issues including appropriate healthy diet, exercise, and smoking avoidance were discussed with patient. . See orders for this visit as documented in the electronic medical record. . Patient received an After Visit Summary.  Briscoe Deutscher, DO [place] 04/03/2018  Future Appointments  Date Time Provider [place]  07/04/2018 10:00 AM Briscoe Deutscher, DO [facility]  09/20/2018 10:00 AM Kozlow, Donnamarie Poag, MD [facility] None

## 2018-04-05 ENCOUNTER — Encounter: Payer: Self-pay | Admitting: Family Medicine

## 2018-04-18 ENCOUNTER — Encounter: Payer: Self-pay | Admitting: Family Medicine

## 2018-04-28 DIAGNOSIS — R52 Pain, unspecified: Secondary | ICD-10-CM | POA: Diagnosis not present

## 2018-04-28 DIAGNOSIS — I10 Essential (primary) hypertension: Secondary | ICD-10-CM | POA: Diagnosis not present

## 2018-04-28 DIAGNOSIS — R109 Unspecified abdominal pain: Secondary | ICD-10-CM | POA: Diagnosis not present

## 2018-04-29 ENCOUNTER — Emergency Department (HOSPITAL_COMMUNITY): Payer: 59

## 2018-04-29 ENCOUNTER — Other Ambulatory Visit: Payer: Self-pay

## 2018-04-29 ENCOUNTER — Emergency Department (HOSPITAL_COMMUNITY)
Admission: EM | Admit: 2018-04-29 | Discharge: 2018-04-29 | Disposition: A | Discharge status: Home / Self Care | Payer: 59 | Attending: Emergency Medicine | Admitting: Emergency Medicine

## 2018-04-29 ENCOUNTER — Encounter (HOSPITAL_COMMUNITY): Payer: Self-pay | Admitting: Emergency Medicine

## 2018-04-29 DIAGNOSIS — N202 Calculus of kidney with calculus of ureter: Secondary | ICD-10-CM | POA: Diagnosis not present

## 2018-04-29 DIAGNOSIS — R1084 Generalized abdominal pain: Secondary | ICD-10-CM | POA: Diagnosis not present

## 2018-04-29 DIAGNOSIS — K529 Noninfective gastroenteritis and colitis, unspecified: Secondary | ICD-10-CM | POA: Diagnosis not present

## 2018-04-29 DIAGNOSIS — N3001 Acute cystitis with hematuria: Secondary | ICD-10-CM | POA: Insufficient documentation

## 2018-04-29 DIAGNOSIS — N390 Urinary tract infection, site not specified: Secondary | ICD-10-CM | POA: Diagnosis not present

## 2018-04-29 DIAGNOSIS — N200 Calculus of kidney: Secondary | ICD-10-CM

## 2018-04-29 DIAGNOSIS — R109 Unspecified abdominal pain: Secondary | ICD-10-CM | POA: Diagnosis present

## 2018-04-29 DIAGNOSIS — R319 Hematuria, unspecified: Secondary | ICD-10-CM

## 2018-04-29 LAB — CBC
HCT: 43.7 % (ref 39.0–52.0)
Hemoglobin: 14.6 g/dL (ref 13.0–17.0)
MCH: 28.6 pg (ref 26.0–34.0)
MCHC: 33.4 g/dL (ref 30.0–36.0)
MCV: 85.5 fL (ref 78.0–100.0)
Platelets: 332 10*3/uL (ref 150–400)
RBC: 5.11 MIL/uL (ref 4.22–5.81)
RDW: 14.1 % (ref 11.5–15.5)
WBC: 17.4 10*3/uL — ABNORMAL HIGH (ref 4.0–10.5)

## 2018-04-29 LAB — COMPREHENSIVE METABOLIC PANEL
ALBUMIN: 4.2 g/dL (ref 3.5–5.0)
ALK PHOS: 67 U/L (ref 38–126)
ALT: 34 U/L (ref 17–63)
AST: 25 U/L (ref 15–41)
Anion gap: 8 (ref 5–15)
BUN: 14 mg/dL (ref 6–20)
CALCIUM: 9.3 mg/dL (ref 8.9–10.3)
CO2: 23 mmol/L (ref 22–32)
Chloride: 106 mmol/L (ref 101–111)
Creatinine, Ser: 0.77 mg/dL (ref 0.61–1.24)
GFR calc Af Amer: >60 mL/min (ref >60)
GFR calc non Af Amer: >60 mL/min (ref >60)
GLUCOSE: 218 mg/dL — AB (ref 65–99)
Potassium: 4 mmol/L (ref 3.5–5.1)
Sodium: 137 mmol/L (ref 135–145)
TOTAL PROTEIN: 7.3 g/dL (ref 6.5–8.1)
Total Bilirubin: 0.5 mg/dL (ref 0.3–1.2)

## 2018-04-29 LAB — URINALYSIS, ROUTINE W REFLEX MICROSCOPIC
BACTERIA UA: NONE SEEN
Bilirubin Urine: NEGATIVE
Glucose, UA: NEGATIVE mg/dL
Ketones, ur: 5 mg/dL — AB
NITRITE: NEGATIVE
Protein, ur: 30 mg/dL — AB
RBC / HPF: >50 RBC/hpf — ABNORMAL HIGH (ref 0–5)
SPECIFIC GRAVITY, URINE: 1.025 (ref 1.005–1.030)
pH: 6 (ref 5.0–8.0)

## 2018-04-29 LAB — LIPASE, BLOOD: Lipase: 30 U/L (ref 11–51)

## 2018-04-29 MED ORDER — CEPHALEXIN 500 MG PO CAPS: 500.0000 mg | ORAL_CAPSULE | Freq: Two times a day (BID) | ORAL | 0 refills | Status: DC

## 2018-04-29 MED ORDER — HYDROCODONE-ACETAMINOPHEN 5-325 MG PO TABS: 1.0000 | ORAL_TABLET | Freq: Four times a day (QID) | ORAL | 0 refills | Status: DC | PRN

## 2018-04-29 MED ORDER — SODIUM CHLORIDE 0.9 % IV SOLN
1.0000 g | Freq: Once | INTRAVENOUS | Status: AC
  Administered 2018-04-29: 1 g via INTRAVENOUS
  Filled 2018-04-29: qty 10

## 2018-04-29 NOTE — ED Triage Notes (Signed)
Patient is complaining of right abdominal pain and being dizzy. Patient states it started around 1930 last night.

## 2018-04-29 NOTE — ED Provider Notes (Signed)
[facility] DEPT Provider Note   CSN: [id number] Arrival date & time: 04/29/18  0014     History   Chief Complaint Chief Complaint  Patient presents with  . Abdominal Pain    HPI Aaron Drake is a 62 y.o. male.  Patient presents to the emergency department with a chief complaint of abdominal pain.  He states that started last night.  He reports some associated dysuria.  He reports frequent kidney infections.  He has not taken anything for symptoms.  He denies any vomiting or diarrhea, but does report some nausea.  Denies any fevers.  He denies any history of kidney stones.  The history is provided by the patient. No language interpreter was used.    Past Medical History:  Diagnosis Date  . Allergy   . Arthritis   . Asthma 05/12/2012  . BPH (benign prostatic hyperplasia) 05/19/2012  . History of kidney stones   . Hyperlipidemia   . Impaired glucose tolerance 05/19/2012  . Tear of meniscus of right knee 2018   partial tear    Patient Active Problem List   Diagnosis Date Noted  . Positive colorectal cancer screening using Cologuard test 04/07/2017  . Morbid obesity ([place]) 03/26/2017  . Type 2 diabetes mellitus without complication, without long-term current use of insulin ([place]) 03/26/2017  . Effusion of right knee 03/14/2017  . Left rotator cuff tear arthropathy 01/28/2014  . BPH (benign prostatic hyperplasia) 05/19/2012  . Hyperlipidemia   . Asthma 05/12/2012    Past Surgical History:  Procedure Laterality Date  . CATARACT EXTRACTION Right   . INSERTION OF MESH N/A 05/17/2016   Procedure: INSERTION OF MESH;  Surgeon: Rolm Bookbinder, MD;  Location: [facility];  Service: General;  Laterality: N/A;  . UMBILICAL HERNIA REPAIR N/A 05/17/2016   Procedure: UMBILICAL HERNIA REPAIR;  Surgeon: Rolm Bookbinder, MD;  Location: [facility];  Service: General;  Laterality: N/A;  . WISDOM TOOTH EXTRACTION          Home Medications    Prior to Admission  medications   Medication Sig Start Date End Date Taking? Authorizing Provider  albuterol (PROAIR HFA) 108 (90 Base) MCG/ACT inhaler Inhale 2 puffs into the lungs every 4 (four) hours as needed for wheezing or shortness of breath. 01/16/18  Yes Kozlow, Donnamarie Poag, MD  beclomethasone (QVAR REDIHALER) 80 MCG/ACT inhaler Inhale two doses twice daily to prevent cough or wheeze.  Rinse, gargle, and spit after use. 11/28/17  Yes Kozlow, Donnamarie Poag, MD  Calcium Carb-Cholecalciferol (CALCIUM + D3 PO) Take 1 tablet by mouth daily.   Yes [provider]  COMBIVENT RESPIMAT 20-100 MCG/ACT AERS respimat USE 1 INHALATION ORALLY    EVERY 6 HOURS 03/21/18  Yes Briscoe Deutscher, DO  CRANBERRY PO Take 2-3 capsules by mouth daily.   Yes [provider]  Dupilumab, Asthma, (DUPIXENT [place]) Inject 1 application into the skin.   Yes [provider]  DUPIXENT 300 MG/2ML SOSY INJECT 2 SYRINGES SUBCUTANEOUSLY ON DAY 1, THEN INJECT 1 SYRINGE ON DAY 15 , THEN 1 SYRINGE EVERY OTHER WEEK THEREAFTER. 02/01/18  Yes Kozlow, Donnamarie Poag, MD  dutasteride (AVODART) 0.5 MG capsule Take 1 capsule (0.5 mg total) by mouth daily. 08/24/17  Yes Briscoe Deutscher, DO  Glucos-Chondroit-Hyaluron-MSM (GLUCOSAMINE CHONDROITIN JOINT) TABS Take 1 tablet by mouth daily.   Yes [provider]  Insulin Pen Needle 31G X 6 MM MISC Use daily on Victoza.  Dx:  E11.9 06/20/17  Yes Briscoe Deutscher,  DO  levocetirizine (XYZAL) 5 MG tablet Take 5 mg by mouth every evening.   Yes [provider]  montelukast (SINGULAIR) 10 MG tablet Take 1 tablet (10 mg total) by mouth daily. 12/19/17  Yes Kozlow, Donnamarie Poag, MD  Multiple Vitamin (MULTIVITAMIN WITH MINERALS) TABS tablet Take 1 tablet by mouth daily.   Yes [provider]  naproxen sodium (ALEVE) 220 MG tablet Take 220 mg by mouth every 8 (eight) hours as needed (For pain.).   Yes [provider]  omeprazole (PRILOSEC) 20 MG capsule Take 1 capsule (20 mg total) by mouth daily.  12/15/17  Yes Kozlow, Donnamarie Poag, MD  Semaglutide  [facility] [employer]) 1 MG/DOSE SOPN Inject 1.5 mg into the skin once a week. 04/03/18  Yes Briscoe Deutscher, DO  TURMERIC PO Take by mouth daily.   Yes [provider]  umeclidinium-vilanterol (ANORO ELLIPTA) 62.5-25 MCG/INH AEPB Inhale 1 puff into the lungs daily. 03/21/18  Yes Kozlow, Donnamarie Poag, MD  VICTOZA 18 MG/3ML SOPN INJECT 0.3 MLS (1.8 MG TOTAL) INTO THE SKIN DAILY. 03/20/18  Yes Briscoe Deutscher, DO    Family History Family History  Problem Relation Age of Onset  . Heart disease Mother   . Lung cancer Mother   . Stroke Father   . COPD Father   . Aortic aneurysm Father   . Colon cancer Maternal Uncle     Social History Social History   Tobacco Use  . Smoking status: Former Research scientist (life sciences)  . Smokeless tobacco: Never Used  Substance Use Topics  . Alcohol use: Yes    Alcohol/week: 0.6 oz    Types: 1 Shots of liquor per week    Comment: rare  . Drug use: No     Allergies   Lipitor [atorvastatin]   Review of Systems Review of Systems  All other systems reviewed and are negative.    Physical Exam Updated Vital Signs BP (!) 158/97 (BP Location: Left Arm)   Pulse (!) 109   Temp 98.8 F (37.1 C) (Oral)   Resp 20   Ht 5\' 4"  (1.626 m)   Wt 99.8 kg (220 lb)   SpO2 97%   BMI 37.76 kg/m   Physical Exam  Constitutional: He is oriented to person, place, and time. He appears well-developed and well-nourished.  HENT:  Head: Normocephalic and atraumatic.  Eyes: Pupils are equal, round, and reactive to light. Conjunctivae and EOM are normal. Right eye exhibits no discharge. Left eye exhibits no discharge. No scleral icterus.  Neck: Normal range of motion. Neck supple. No JVD present.  Cardiovascular: Normal rate, regular rhythm and normal heart sounds. Exam reveals no gallop and no friction rub.  No murmur heard. Pulmonary/Chest: Effort normal and breath sounds normal. No respiratory distress. He has no wheezes. He has no rales. He exhibits  no tenderness.  Abdominal: Soft. He exhibits no distension and no mass. There is no tenderness. There is no rebound and no guarding.  No focal abdominal tenderness, no RLQ tenderness or pain at McBurney's point, no RUQ tenderness or Murphy's sign, no left-sided abdominal tenderness, no fluid wave, or signs of peritonitis   Musculoskeletal: Normal range of motion. He exhibits no edema or tenderness.  Neurological: He is alert and oriented to person, place, and time.  Skin: Skin is warm and dry.  Psychiatric: He has a normal mood and affect. His behavior is normal. Judgment and thought content normal.  Nursing note and vitals reviewed.    ED Treatments / Results  Labs (all  labs ordered are listed, but only abnormal results are displayed) Labs Reviewed  COMPREHENSIVE METABOLIC PANEL - Abnormal; Notable for the following components:      Result Value   Glucose, Bld 218 (*)    All other components within normal limits  CBC - Abnormal; Notable for the following components:   WBC 17.4 (*)    All other components within normal limits  URINALYSIS, ROUTINE W REFLEX MICROSCOPIC - Abnormal; Notable for the following components:   APPearance HAZY (*)    Hgb urine dipstick LARGE (*)    Ketones, ur 5 (*)    Protein, ur 30 (*)    Leukocytes, UA SMALL (*)    RBC / HPF >50 (*)    All other components within normal limits  LIPASE, BLOOD    EKG None  Radiology Ct Renal Stone Study  Result Date: 04/29/2018 CLINICAL DATA:  62 y/o  M; right abdominal pain and dizziness. EXAM: CT ABDOMEN AND PELVIS WITHOUT CONTRAST TECHNIQUE: Multidetector CT imaging of the abdomen and pelvis was performed following the standard protocol without IV contrast. COMPARISON:  None. FINDINGS: Lower chest: No acute abnormality. Hepatobiliary: Hepatic steatosis. No focal liver abnormality is seen. No gallstones, gallbladder wall thickening, or biliary dilatation. Pancreas: Unremarkable. No pancreatic ductal dilatation or  surrounding inflammatory changes. Spleen: Normal in size without focal abnormality. Adrenals/Urinary Tract: 13 mm right adrenal nodule measuring -2 HU. Normal right kidney and ureter. Left kidney lower pole nonobstructing stone. 13 mm stone within the left ureteropelvic junction. The ureter downstream to the ureteropelvic junction stone is decompressed. Normal bladder. Stomach/Bowel: Long segment of small bowel within the mid abdomen with wall thickening and mild surrounding inflammation in the mesenteric fat. No additional obstructive or inflammatory changes of the small and large bowel. Normal appearance of the stomach. Vascular/Lymphatic: Aortic atherosclerosis. No enlarged abdominal or pelvic lymph nodes. Reproductive: Prostatic calcification. Other: No abdominal wall hernia or abnormality. No abdominopelvic ascites. Musculoskeletal: No fracture is seen. IMPRESSION: 1. Long segment of enteritis in the mid abdomen, probably infectious or inflammatory. No findings of perforation or abscess. No obstruction. 2. 13 mm left kidney ureteropelvic junction stone. Nonobstructing stones in lower pole of left kidney. 3. Hepatic steatosis. 4. Aortic atherosclerosis. Electronically Signed   By: Kristine Garbe M.D.   On: 04/29/2018 05:11    Procedures Procedures (including critical care time)  Medications Ordered in ED Medications  cefTRIAXone (ROCEPHIN) 1 g in sodium chloride 0.9 % 100 mL IVPB (has no administration in time range)     Initial Impression / Assessment and Plan / ED Course  I have reviewed the triage vital signs and the nursing notes.  Pertinent labs & imaging results that were available during my care of the patient were reviewed by me and considered in my medical decision making (see chart for details).     Patient with abdominal pain, hematuria, and recurrent kidney infections.  He has a leukocytosis to 17.4.  Kidney function is normal.  Urinalysis is consistent with  infection.  CT renal study shows 13 mm left-sided ureteropelvic stone.  I discussed this with Dr. Alyson Ingles from urology, who recommends follow-up in his office next week.  Discharge home on Keflex per urology recommendations.  Patient given 1 g of Rocephin in the ED.  His pain is well controlled.  He is stable for discharge.  Enteritis seen on CT, but patient does not have any of the symptoms.  I did notify him of this, and advised careful monitoring of  symptoms.  Final Clinical Impressions(s) / ED Diagnoses   Final diagnoses:  Generalized abdominal pain  Urinary tract infection with hematuria, site unspecified  Enteritis  Renal stone    ED Discharge Orders        Ordered    cephALEXin (KEFLEX) 500 MG capsule  2 times daily     04/29/18 0611    HYDROcodone-acetaminophen (NORCO/VICODIN) 5-325 MG tablet  Every 6 hours PRN     04/29/18 0611       Montine Circle, PA-C 04/29/18 0646    Palumbo, April, MD 04/29/18 1427

## 2018-04-29 NOTE — ED Notes (Signed)
Pt back from CT

## 2018-04-29 NOTE — ED Notes (Signed)
Patient transported to CT 

## 2018-05-04 ENCOUNTER — Other Ambulatory Visit: Payer: Self-pay | Admitting: Urology

## 2018-05-04 DIAGNOSIS — N201 Calculus of ureter: Secondary | ICD-10-CM | POA: Diagnosis not present

## 2018-05-05 ENCOUNTER — Encounter (HOSPITAL_COMMUNITY): Payer: Self-pay | Admitting: *Deleted

## 2018-05-05 ENCOUNTER — Other Ambulatory Visit: Payer: Self-pay

## 2018-05-05 NOTE — H&P (Signed)
Office Visit Report     05/04/2018    Aaron Drake         MRN: [id number]  PRIMARY CARE:  Briscoe Deutscher, DO  DOB: 08-Feb-1956, 62 year old Male  REFERRING:    SSN:   PROVIDER:  Nicolette Bang, M.D.    LOCATION:  [facility] [phone]    CC: I have kidney stones.  HPI: Aaron Drake is a 62 year-old male patient who is here for renal calculi.  The problem is on the left side. He first stated noticing pain on 04/29/2018. This is his first kidney stone. He is currently having flank pain and back pain. He denies having groin pain, nausea, vomiting, fever, and chills. He has not caught a stone in his urine strainer since his symptoms began.   He has never had surgical treatment for calculi in the past.   05/04/2018: On JUne 1st he developed severe sharp abdominal pain. He presented to the ER and was diagnosed with a 89m L UPJ calculus. He was also diagnosed with enteritis.    CC: I have an enlarged prostate (follow-up).  HPI: He is currently taking avodart. He is not on new medications for symptoms of prostate enlargement.   He does have an abnormal sensation when needing to urinate. He is having problems getting his urine stream started. He does have to strain or bear down to start his urinary stream. He does not have a good size and strength to his urinary stream. He is not having problems with emptying his bladder well. He does dribble at the end of urination.   05/04/2018: He has been on avodart for 10 years.   AUA Symptom Score: Less than 20% of the time he has the sensation of not emptying his bladder completely when finished urinating. 50% of the time he has to urinate again fewer than two hours after he has finished urinating. 50% of the time he has to start and stop again several times when he urinates. Less than 50% of the time he finds it difficult to postpone urination. 50% of the time he has a weak urinary stream. More than 50% of the time he has to push or strain to  begin urination. He never has to get up to urinate from the time he goes to bed until the time he gets up in the morning.   Calculated AUA Symptom Score: 16  QOL Score: He would feel mostly satisfied if he had to live with his urinary condition the way it is now for the rest of his life.   Calculated QOL Symptom Score: 2  ALLERGIES: Statin Drugs   MEDICATIONS: Omeprazole  Albuterol Sulfate  Beclomethasone Dipropionate  Calcium Carbonate powder  Cephalexin  Combivent Respimat  Cranberry  Dupixent  Dutasteride 0.5 mg capsule  Insulin Pen Needle  Levocetirizine Dihydrochloride 5 mg tablet  Montelukast Sodium 10 mg tablet  Multiple Vitamin  Ozempic  Victoza 2-Pak 0.6 mg/0.1 ml (18 mg/3 ml) pen injector    GU PSH: None   NON-GU PSH: Cataract surgery Repair Umbilical Lesion   GU PMH: None   NON-GU PMH: Asthma Diabetes Type 2 GERD Hypercholesterolemia   FAMILY HISTORY: Death In The Family Father - Father Death In The Family Mother - Mother   SOCIAL HISTORY: Marital Status: Single Current Smoking Status: Patient does not smoke anymore. Has not smoked since 04/29/1996.   Tobacco Use Assessment Completed:  Used Tobacco in last 30 days?  Does not  use drugs. Drinks 3 caffeinated drinks per day. Has not had a blood transfusion.   REVIEW OF SYSTEMS:    GU Review Male:   Patient reports burning/ pain with urination, get up at night to urinate, stream starts and stops, trouble starting your stream, and have to strain to urinate . Patient denies frequent urination, hard to postpone urination, leakage of urine, erection problems, and penile pain.  Gastrointestinal (Upper):   Patient denies nausea, vomiting, and indigestion/ heartburn.  Gastrointestinal (Lower):   Patient denies diarrhea and constipation.  Constitutional:   Patient denies fever, night sweats, weight loss, and fatigue.  Skin:   Patient denies skin rash/ lesion and itching.  Eyes:   Patient denies blurred vision and  double vision.  Ears/ Nose/ Throat:   Patient denies sore throat and sinus problems.  Hematologic/Lymphatic:   Patient denies swollen glands and easy bruising.  Cardiovascular:   Patient denies leg swelling and chest pains.  Respiratory:   Patient denies cough and shortness of breath.  Endocrine:   Patient denies excessive thirst.  Musculoskeletal:   Patient reports back pain. Patient denies joint pain.  Neurological:   Patient denies headaches and dizziness.  Psychologic:   Patient denies anxiety and depression.   VITAL SIGNS:      05/04/2018 01:10 PM  Weight 220 lb / 99.79 kg  Height 64 in / 162.56 cm  BP 131/75 mmHg  Pulse 88 /min  Temperature 98.0 F / 36.6 C  BMI 37.8 kg/m   GU PHYSICAL EXAMINATION:    Anus and Perineum: No hemorrhoids. No anal stenosis. No rectal fissure, no anal fissure. No edema, no dimple, no perineal tenderness, no anal tenderness.  Scrotum: No lesions. No edema. No cysts. No warts.  Epididymides: Right: no spermatocele, no masses, no cysts, no tenderness, no induration, no enlargement. Left: no spermatocele, no masses, no cysts, no tenderness, no induration, no enlargement.  Testes: No tenderness, no swelling, no enlargement left testes. No tenderness, no swelling, no enlargement right testes. Normal location left testes. Normal location right testes. No mass, no cyst, no varicocele, no hydrocele left testes. No mass, no cyst, no varicocele, no hydrocele right testes.  Urethral Meatus: Normal size. No lesion, no wart, no discharge, no polyp. Normal location.  Penis: Circumcised, no warts, no cracks. No dorsal Peyronie's plaques, no left corporal Peyronie's plaques, no right corporal Peyronie's plaques, no scarring, no warts. No balanitis, no meatal stenosis.  Prostate: Prostate about 30 grams. Left lobe normal consistency, right lobe normal consistency. Symmetrical lobes. No prostate nodule. Left lobe no tenderness, right lobe no tenderness.   Seminal Vesicles:  Nonpalpable.  Sphincter Tone: Normal sphincter. No rectal tenderness. No rectal mass.    MULTI-SYSTEM PHYSICAL EXAMINATION:    Constitutional: Well-nourished. No physical deformities. Normally developed. Good grooming.  Neck: Neck symmetrical, not swollen. Normal tracheal position.  Respiratory: No labored breathing, no use of accessory muscles. Normal breath sounds.  Cardiovascular: Regular rate and rhythm. No murmur, no gallop. Normal temperature, normal extremity pulses, no swelling, no varicosities.  Lymphatic: No enlargement of neck, axillae, groin.  Skin: No paleness, no jaundice, no cyanosis. No lesion, no ulcer, no rash.  Neurologic / Psychiatric: Oriented to time, oriented to place, oriented to person. No depression, no anxiety, no agitation.  Gastrointestinal: No mass, no tenderness, no rigidity, non obese abdomen.  Eyes: Normal conjunctivae. Normal eyelids.  Ears, Nose, Mouth, and Throat: Left ear no scars, no lesions, no masses. Right ear no scars, no lesions, no  masses. Nose no scars, no lesions, no masses. Normal hearing. Normal lips.  Musculoskeletal: Normal gait and station of head and neck.    PAST DATA REVIEWED:  Source Of History:  Patient   PROCEDURES:         KUB - [id number]  A single view of the abdomen is obtained.  Bony Abnormalities:  no bony abnormalities  Fecal Stasis:  no focal stasis  Soft Tissue:  no soft tissue abnormalities  Calculi:  60m left UPJ calculus. 454mleft lower pole calculus              Urinalysis w/Scope - 81001 Dipstick Dipstick Cont'd Micro  Color: Yellow Bilirubin: Neg WBC/hpf: 0 - 5/hpf  Appearance: Clear Ketones: Neg RBC/hpf: 3 - 10/hpf  Specific Gravity: 1.020 Blood: 1+ Bacteria: Rare (0-9/hpf)  pH: 6.5 Protein: Trace Cystals: NS (Not Seen)  Glucose: Neg Urobilinogen: 0.2 Casts: NS (Not Seen)    Nitrites: Neg Trichomonas: Not Present    Leukocyte Esterase: 1+ Mucous: Not Present      Epithelial Cells: NS (Not Seen)      Yeast: NS  (Not Seen)      Sperm: Not Present   Notes:  Microscopic done on unconcentrated urine   ASSESSMENT:      ICD-10 Details  1 GU:   Ureteral calculus - N20.1   2   BPH w/LUTS - N40.1    PLAN:           Medications New Meds: Tamsulosin Hcl 0.4 mg capsule 1 capsule PO Q HS   #30  11 Refill(s)  Tamsulosin Hcl 0.4 mg capsule 1 capsule PO Q HS   #30  0 Refill(s)          Orders X-Rays: KUB         Schedule          Document Letter(s):  Created for Patient: Clinical Summary        Notes:   Ureteral calculus:  -We discussed MET versus URS versus ESWL and the patient elects for ESWL   BPH with LUTS, dribbling:  -We discussed alpha blocker therapy and the patient elects to proceed.         Next Appointment:      Next Appointment: 05/08/2018 12:30 PM    Appointment Type: Surgery     Location: [facility] - [phone]    Provider: MaFestus AloeM.D.    Reason for Visit: OP WLDirk DressT ESWL     * Signed by PaNicolette BangM.D. on 05/05/18 at 2:06 PM (EDT)*     The information contained in this medical record document is considered private and confidential patient information. This information can only be used for the medical diagnosis and/or medical services that are being provided by the patient's selected caregivers. This information can only be distributed outside of the patient's care if the patient agrees and signs waivers of authorization for this information to be sent to an outside source or route.

## 2018-05-08 ENCOUNTER — Encounter (HOSPITAL_COMMUNITY)
Admission: RE | Disposition: A | Discharge status: Home / Self Care | Payer: Self-pay | Source: Other Acute Inpatient Hospital | Attending: Urology

## 2018-05-08 ENCOUNTER — Ambulatory Visit (HOSPITAL_COMMUNITY)
Admission: RE | Admit: 2018-05-08 | Discharge: 2018-05-08 | Disposition: A | Discharge status: Home / Self Care | Payer: 59 | Source: Other Acute Inpatient Hospital | Attending: Urology | Admitting: Urology

## 2018-05-08 ENCOUNTER — Encounter (HOSPITAL_COMMUNITY): Payer: Self-pay | Admitting: *Deleted

## 2018-05-08 ENCOUNTER — Ambulatory Visit (HOSPITAL_COMMUNITY): Payer: 59

## 2018-05-08 ENCOUNTER — Other Ambulatory Visit: Payer: Self-pay

## 2018-05-08 DIAGNOSIS — Z87891 Personal history of nicotine dependence: Secondary | ICD-10-CM | POA: Diagnosis not present

## 2018-05-08 DIAGNOSIS — K219 Gastro-esophageal reflux disease without esophagitis: Secondary | ICD-10-CM | POA: Insufficient documentation

## 2018-05-08 DIAGNOSIS — Z794 Long term (current) use of insulin: Secondary | ICD-10-CM | POA: Diagnosis not present

## 2018-05-08 DIAGNOSIS — Z79899 Other long term (current) drug therapy: Secondary | ICD-10-CM | POA: Insufficient documentation

## 2018-05-08 DIAGNOSIS — N401 Enlarged prostate with lower urinary tract symptoms: Secondary | ICD-10-CM | POA: Insufficient documentation

## 2018-05-08 DIAGNOSIS — E7800 Pure hypercholesterolemia, unspecified: Secondary | ICD-10-CM | POA: Diagnosis not present

## 2018-05-08 DIAGNOSIS — E119 Type 2 diabetes mellitus without complications: Secondary | ICD-10-CM | POA: Diagnosis not present

## 2018-05-08 DIAGNOSIS — E669 Obesity, unspecified: Secondary | ICD-10-CM | POA: Insufficient documentation

## 2018-05-08 DIAGNOSIS — Z888 Allergy status to other drugs, medicaments and biological substances status: Secondary | ICD-10-CM | POA: Diagnosis not present

## 2018-05-08 DIAGNOSIS — N202 Calculus of kidney with calculus of ureter: Secondary | ICD-10-CM | POA: Diagnosis not present

## 2018-05-08 DIAGNOSIS — N201 Calculus of ureter: Secondary | ICD-10-CM | POA: Diagnosis not present

## 2018-05-08 DIAGNOSIS — J45909 Unspecified asthma, uncomplicated: Secondary | ICD-10-CM | POA: Insufficient documentation

## 2018-05-08 HISTORY — PX: EXTRACORPOREAL SHOCK WAVE LITHOTRIPSY: SHX1557

## 2018-05-08 HISTORY — DX: Type 2 diabetes mellitus without complications: E11.9

## 2018-05-08 HISTORY — DX: Dyspnea, unspecified: R06.00

## 2018-05-08 SURGERY — LITHOTRIPSY, ESWL
Anesthesia: LOCAL | Laterality: Left

## 2018-05-08 MED ORDER — SODIUM CHLORIDE 0.9 % IV SOLN
INTRAVENOUS | Status: DC
  Administered 2018-05-08: 11:00:00 via INTRAVENOUS

## 2018-05-08 MED ORDER — DIPHENHYDRAMINE HCL 25 MG PO CAPS
25.0000 mg | ORAL_CAPSULE | ORAL | Status: AC
  Administered 2018-05-08: 25 mg via ORAL
  Filled 2018-05-08: qty 1

## 2018-05-08 MED ORDER — CIPROFLOXACIN HCL 500 MG PO TABS
500.0000 mg | ORAL_TABLET | ORAL | Status: AC
  Administered 2018-05-08: 500 mg via ORAL
  Filled 2018-05-08: qty 1

## 2018-05-08 MED ORDER — DIAZEPAM 5 MG PO TABS
10.0000 mg | ORAL_TABLET | ORAL | Status: AC
  Administered 2018-05-08: 10 mg via ORAL
  Filled 2018-05-08: qty 2

## 2018-05-08 MED ORDER — NAPROXEN SODIUM 220 MG PO TABS: 220.0000 mg | ORAL_TABLET | Freq: Three times a day (TID) | ORAL | Status: DC | PRN

## 2018-05-08 NOTE — Op Note (Signed)
Left ESWL   13 mm left proximal stone   Findings: Rate slowed to 60. Stone shortened, faded and spread out but did not totally disappear. He may need a staged procedure.

## 2018-05-08 NOTE — Interval H&P Note (Signed)
History and Physical Interval Note:  05/08/2018 11:27 AM  Aaron Drake  has presented today for surgery, with the diagnosis of left ureteral stone  The various methods of treatment have been discussed with the patient and family. After consideration of risks, benefits and other options for treatment, the patient has consented to  Procedure(s): LEFT EXTRACORPOREAL SHOCK WAVE LITHOTRIPSY (ESWL) (Left) as a surgical intervention .  [name] history has been reviewed, patient examined, no change in status, stable for surgery. Stone remains left proximal ureter on KUB. He has no fever or dysuria. Some hesitancy and he already started tamsulosin.  I have reviewed the patient's chart and labs.  Questions were answered to the patient's satisfaction.  He elects to proceed.    [name]

## 2018-05-08 NOTE — Discharge Instructions (Signed)
Moderate Conscious Sedation, Adult, Care After °These instructions provide you with information about caring for yourself after your procedure. Your health care provider may also give you more specific instructions. Your treatment has been planned according to current medical practices, but problems sometimes occur. Call your health care provider if you have any problems or questions after your procedure. °What can I expect after the procedure? °After your procedure, it is common: °To feel sleepy for several hours. °To feel clumsy and have poor balance for several hours. °To have poor judgment for several hours. °To vomit if you eat too soon. ° °Follow these instructions at home: °For at least 24 hours after the procedure: ° °Do not: °Participate in activities where you could fall or become injured. °Drive. °Use heavy machinery. °Drink alcohol. °Take sleeping pills or medicines that cause drowsiness. °Make important decisions or sign legal documents. °Take care of children on your own. °Rest. °Eating and drinking °Follow the diet recommended by your health care provider. °If you vomit: °Drink water, juice, or soup when you can drink without vomiting. °Make sure you have little or no nausea before eating solid foods. °General instructions °Have a responsible adult stay with you until you are awake and alert. °Take over-the-counter and prescription medicines only as told by your health care provider. °If you smoke, do not smoke without supervision. °Keep all follow-up visits as told by your health care provider. This is important. °Contact a health care provider if: °You keep feeling nauseous or you keep vomiting. °You feel light-headed. °You develop a rash. °You have a fever. °Get help right away if: °You have trouble breathing. °This information is not intended to replace advice given to you by your health care provider. Make sure you discuss any questions you have with your health care provider. °Document Released:  09/05/2013 Document Revised: 04/19/2016 Document Reviewed: 03/06/2016 °Elsevier Interactive Patient Education © 2018 Elsevier Inc[employer] °Lithotripsy, Care After °This sheet gives you information about how to care for yourself after your procedure. Your health care provider may also give you more specific instructions. If you have problems or questions, contact your health care provider. °What can I expect after the procedure? °After the procedure, it is common to have: °· Some blood in your urine. This should only last for a few days. °· Soreness in your back, sides, or upper abdomen for a few days. °· Blotches or bruises on your back where the pressure wave entered the skin. °· Pain, discomfort, or nausea when pieces (fragments) of the kidney stone move through the tube that carries urine from the kidney to the bladder (ureter). Stone fragments may pass soon after the procedure, but they may continue to pass for up to 4-8 weeks. °? If you have severe pain or nausea, contact your health care provider. This may be caused by a large stone that was not broken up, and this may mean that you need more treatment. °· Some pain or discomfort during urination. °· Some pain or discomfort in the lower abdomen or (in men) at the base of the penis. ° °Follow these instructions at home: °Medicines °· Take over-the-counter and prescription medicines only as told by your health care provider. °· If you were prescribed an antibiotic medicine, take it as told by your health care provider. Do not stop taking the antibiotic even if you start to feel better. °· Do not drive for 24 hours if you were given a medicine to help you relax (sedative). °· Do not drive   or use heavy machinery while taking prescription pain medicine. °Eating and drinking °· Drink enough water and fluids to keep your urine clear or pale yellow. This helps any remaining pieces of the stone to pass. It can also help prevent new stones from forming. °· Eat plenty of fresh  fruits and vegetables. °· Follow instructions from your health care provider about eating and drinking restrictions. You may be instructed: °? To reduce how much salt (sodium) you eat or drink. Check ingredients and nutrition facts on packaged foods and beverages. °? To reduce how much meat you eat. °· Eat the recommended amount of calcium for your age and gender. Ask your health care provider how much calcium you should have. °General instructions °· Get plenty of rest. °· Most people can resume normal activities 1-2 days after the procedure. Ask your health care provider what activities are safe for you. °· If directed, strain all urine through the strainer that was provided by your health care provider. °? Keep all fragments for your health care provider to see. Any stones that are found may be sent to a medical lab for examination. The stone may be as small as a grain of salt. °· Keep all follow-up visits as told by your health care provider. This is important. °Contact a health care provider if: °· You have pain that is severe or does not get better with medicine. °· You have nausea that is severe or does not go away. °· You have blood in your urine longer than your health care provider told you to expect. °· You have more blood in your urine. °· You have pain during urination that does not go away. °· You urinate more frequently than usual and this does not go away. °· You develop a rash or any other possible signs of an allergic reaction. °Get help right away if: °· You have severe pain in your back, sides, or upper abdomen. °· You have severe pain while urinating. °· Your urine is very dark red. °· You have blood in your stool (feces). °· You cannot pass any urine at all. °· You feel a strong urge to urinate after emptying your bladder. °· You have a fever or chills. °· You develop shortness of breath, difficulty breathing, or chest pain. °· You have severe nausea that leads to persistent vomiting. °· You  faint. °Summary °· After this procedure, it is common to have some pain, discomfort, or nausea when pieces (fragments) of the kidney stone move through the tube that carries urine from the kidney to the bladder (ureter). If this pain or nausea is severe, however, you should contact your health care provider. °· Most people can resume normal activities 1-2 days after the procedure. Ask your health care provider what activities are safe for you. °· Drink enough water and fluids to keep your urine clear or pale yellow. This helps any remaining pieces of the stone to pass, and it can help prevent new stones from forming. °· If directed, strain your urine and keep all fragments for your health care provider to see. Fragments or stones may be as small as a grain of salt. °· Get help right away if you have severe pain in your back, sides, or upper abdomen or have severe pain while urinating. °This information is not intended to replace advice given to you by your health care provider. Make sure you discuss any questions you have with your health care provider. °Document Released: 12/05/2007 Document Revised:   10/06/2016 Document Reviewed: 10/06/2016 °Elsevier Interactive Patient Education © 2018 Elsevier Inc. ° °

## 2018-05-09 ENCOUNTER — Encounter (HOSPITAL_COMMUNITY): Payer: Self-pay | Admitting: Urology

## 2018-05-16 ENCOUNTER — Other Ambulatory Visit: Payer: Self-pay | Admitting: *Deleted

## 2018-05-16 MED ORDER — MONTELUKAST SODIUM 10 MG PO TABS: 10.0000 mg | ORAL_TABLET | Freq: Every day | ORAL | 5 refills | Status: DC

## 2018-05-22 DIAGNOSIS — N201 Calculus of ureter: Secondary | ICD-10-CM | POA: Diagnosis not present

## 2018-05-31 ENCOUNTER — Telehealth: Payer: Self-pay

## 2018-05-31 ENCOUNTER — Other Ambulatory Visit: Payer: Self-pay

## 2018-05-31 MED ORDER — OMEPRAZOLE 20 MG PO CPDR: 20.0000 mg | DELAYED_RELEASE_CAPSULE | Freq: Every day | ORAL | 1 refills | Status: DC

## 2018-05-31 NOTE — Telephone Encounter (Signed)
Medication request for Omeprazol 20MG  sent from [facility]. I sent in a refill.

## 2018-06-06 NOTE — Telephone Encounter (Signed)
Error

## 2018-06-21 DIAGNOSIS — N200 Calculus of kidney: Secondary | ICD-10-CM | POA: Insufficient documentation

## 2018-07-04 ENCOUNTER — Ambulatory Visit: Payer: 59 | Admitting: Family Medicine

## 2018-07-04 ENCOUNTER — Encounter: Payer: Self-pay | Admitting: Family Medicine

## 2018-07-04 VITALS — BP 126/74 | HR 75 | Temp 98.3°F | Ht 64.0 in | Wt 222.0 lb

## 2018-07-04 DIAGNOSIS — M791 Myalgia, unspecified site: Secondary | ICD-10-CM | POA: Diagnosis not present

## 2018-07-04 DIAGNOSIS — E785 Hyperlipidemia, unspecified: Secondary | ICD-10-CM

## 2018-07-04 DIAGNOSIS — R300 Dysuria: Secondary | ICD-10-CM

## 2018-07-04 DIAGNOSIS — E1169 Type 2 diabetes mellitus with other specified complication: Secondary | ICD-10-CM | POA: Diagnosis not present

## 2018-07-04 DIAGNOSIS — E119 Type 2 diabetes mellitus without complications: Secondary | ICD-10-CM | POA: Diagnosis not present

## 2018-07-04 DIAGNOSIS — T466X5A Adverse effect of antihyperlipidemic and antiarteriosclerotic drugs, initial encounter: Secondary | ICD-10-CM

## 2018-07-04 LAB — POC URINALSYSI DIPSTICK (AUTOMATED)
Bilirubin, UA: NEGATIVE
Blood, UA: NEGATIVE
Glucose, UA: NEGATIVE
Ketones, UA: NEGATIVE
Nitrite, UA: NEGATIVE
Protein, UA: NEGATIVE
Spec Grav, UA: 1.02 (ref 1.010–1.025)
Urobilinogen, UA: 0.2 E.U./dL
pH, UA: 6 (ref 5.0–8.0)

## 2018-07-04 LAB — POCT GLYCOSYLATED HEMOGLOBIN (HGB A1C): Hemoglobin A1C: 6.3 % — AB (ref 4.0–5.6)

## 2018-07-04 LAB — MICROALBUMIN / CREATININE URINE RATIO
Creatinine,U: 75.7 mg/dL
Microalb Creat Ratio: 6.5 mg/g (ref 0.0–30.0)
Microalb, Ur: 5 mg/dL — ABNORMAL HIGH (ref 0.0–1.9)

## 2018-07-04 MED ORDER — LOSARTAN POTASSIUM 50 MG PO TABS
25.0000 mg | ORAL_TABLET | Freq: Every day | ORAL | 3 refills | Status: DC
Start: 2018-07-04 — End: 2019-06-18

## 2018-07-04 MED ORDER — ASPIRIN EC 81 MG PO TBEC: 81.0000 mg | DELAYED_RELEASE_TABLET | Freq: Every day | ORAL | 3 refills | Status: DC

## 2018-07-04 MED ORDER — CEPHALEXIN 500 MG PO CAPS: 500.0000 mg | ORAL_CAPSULE | Freq: Three times a day (TID) | ORAL | 0 refills | Status: AC

## 2018-07-04 MED ORDER — CEPHALEXIN 500 MG PO CAPS: 500.0000 mg | ORAL_CAPSULE | Freq: Three times a day (TID) | ORAL | 0 refills | Status: DC

## 2018-07-04 NOTE — Progress Notes (Signed)
Aaron Drake is a 62 y.o. male is here for follow up.  History of Present Illness:   [name], CMA acting as scribe for Dr. Briscoe Deutscher.   HPI: Patient in office today for follow up. He has started having frequency and painful urination for the last two days.    Current symptoms: no polyuria or polydipsia, no chest pain, dyspnea or TIA's, no numbness, tingling or pain in extremities.   On ACE inhibitor or angiotensin II receptor blocker? []   YES  [x]   NO On Aspirin? []   YES  [x]   NO  Lab Results  Component Value Date   HGBA1C 6.3 (A) 07/04/2018    Lab Results  Component Value Date   MICROALBUR <0.7 11/11/2016    Lab Results  Component Value Date   CHOL 205 (H) 11/11/2016   HDL 62.70 11/11/2016   LDLCALC 108 (H) 11/11/2016   LDLDIRECT 136.6 01/11/2014   TRIG 168.0 (H) 11/11/2016   CHOLHDL 3 11/11/2016     Wt Readings from Last 3 Encounters:  07/04/18 222 lb (100.7 kg)  05/08/18 223 lb (101.2 kg)  04/29/18 220 lb (99.8 kg)   BP Readings from Last 3 Encounters:  07/04/18 126/74  05/08/18 (!) 159/106  04/29/18 (!) 146/79   Lab Results  Component Value Date   CREATININE 0.77 04/29/2018   The 10-year ASCVD risk score Mikey Bussing DC Jr., et al., 2013) is: 17.4%   Values used to calculate the score:     Age: 57 years     Sex: Male     Is Non-Hispanic African American: No     Diabetic: Yes     Tobacco smoker: No     Systolic Blood Pressure: 646 mmHg     Is BP treated: No     HDL Cholesterol: 62.7 mg/dL     Total Cholesterol: 205 mg/dL  Health Maintenance Due  Topic Date Due  . OPHTHALMOLOGY EXAM  10/01/1966  . INFLUENZA VACCINE  06/29/2018    Depression screen [facility] 2/9 04/03/2018 03/21/2017 11/11/2016  Decreased Interest 0 0 0  Down, Depressed, Hopeless 0 0 1  PHQ - 2 Score 0 0 1   PMHx, SurgHx, SocialHx, FamHx, Medications, and Allergies were reviewed in the Visit Navigator and updated as appropriate.   Patient Active Problem List   Diagnosis Date  Noted  . Myalgia due to statin 07/04/2018  . Renal calculi 06/21/2018  . Positive colorectal cancer screening using Cologuard test 04/07/2017  . Morbid obesity ([place]) 03/26/2017  . Type 2 diabetes mellitus without complication, without long-term current use of insulin ([place]) 03/26/2017  . Effusion of right knee 03/14/2017  . Left rotator cuff tear arthropathy 01/28/2014  . BPH (benign prostatic hyperplasia) 05/19/2012  . Hyperlipidemia associated with type 2 diabetes mellitus ([place])   . Asthma 05/12/2012   Social History   Tobacco Use  . Smoking status: Former Research scientist (life sciences)  . Smokeless tobacco: Never Used  Substance Use Topics  . Alcohol use: Yes    Alcohol/week: 0.6 oz    Types: 1 Shots of liquor per week    Comment: rare  . Drug use: No   Current Medications and Allergies:   .  albuterol (PROAIR HFA) 108 (90 Base) MCG/ACT inhaler, Inhale 2 puffs into the lungs every 4 (four) hours as needed for wheezing or shortness of breath., Disp: 3 Inhaler, Rfl: 0 .  beclomethasone (QVAR REDIHALER) 80 MCG/ACT inhaler, Inhale two doses twice daily to prevent cough or wheeze.  Rinse, gargle, and spit after use., Disp: 3 Inhaler, Rfl: 0 .  Calcium Carb-Cholecalciferol (CALCIUM + D3 PO), Take 1 tablet by mouth daily., Disp: , Rfl:  .  COMBIVENT RESPIMAT 20-100 MCG/ACT AERS respimat, USE 1 INHALATION ORALLY    EVERY 6 HOURS, Disp: 12 g, Rfl: 3 .  CRANBERRY PO, Take 2-3 capsules by mouth daily., Disp: , Rfl:  .  Dupilumab, Asthma, (DUPIXENT [place]), Inject 1 application into the skin., Disp: , Rfl:  .  DUPIXENT 300 MG/2ML SOSY, INJECT 2 SYRINGES SUBCUTANEOUSLY ON DAY 1, THEN INJECT 1 SYRINGE ON DAY 15 , THEN 1 SYRINGE EVERY OTHER WEEK THEREAFTER. Marland Kitchen  dutasteride (AVODART) 0.5 MG capsule, Take 1 capsule (0.5 mg total) by mouth daily., Disp: 90 capsule, Rfl: 3 .  Glucos-Chondroit-Hyaluron-MSM (GLUCOSAMINE CHONDROITIN JOINT) TABS, Take 1 tablet by mouth daily., Disp: , Rfl:  .  levocetirizine (XYZAL) 5 MG tablet,  Take 5 mg by mouth every evening., Disp: , Rfl:  .  montelukast (SINGULAIR) 10 MG tablet, Take 1 tablet (10 mg total) by mouth daily., Disp: 90 tablet, Rfl: 5 .  Multiple Vitamin (MULTIVITAMIN WITH MINERALS) TABS tablet, Take 1 tablet by mouth daily., Disp: , Rfl:  .  naproxen sodium (ALEVE) 220 MG tablet, Take 1 tablet (220 mg total) by mouth every 8 (eight) hours as needed (For pain.)., Disp: , Rfl:  .  omeprazole (PRILOSEC) 20 MG capsule, Take 1 capsule (20 mg total) by mouth daily., Disp: 90 capsule, Rfl: 1 .  Semaglutide (OZEMPIC) 1 MG/DOSE SOPN, Inject 1.5 mg into the skin once a week., Disp: 4 pen, Rfl: 6 .  tamsulosin (FLOMAX) 0.4 MG CAPS capsule, Take 1 capsule by mouth daily., Disp: , Rfl:  .  TURMERIC PO, Take by mouth daily., Disp: , Rfl:  .  umeclidinium-vilanterol (ANORO ELLIPTA) 62.5-25 MCG/INH AEPB, Inhale 1 puff into the lungs daily., Disp: 84 each, Rfl: 5  Allergies  Allergen Reactions  . Lipitor [Atorvastatin] Other (See Comments)    Muscle cramps and joint ache   Review of Systems   Pertinent items are noted in the HPI. Otherwise, ROS is negative.  Vitals:   Vitals:   07/04/18 0944  BP: 126/74  Pulse: 75  Temp: 98.3 F (36.8 C)  TempSrc: Oral  SpO2: 96%  Weight: 222 lb (100.7 kg)  Height: 5\' 4"  (1.626 m)     Body mass index is 38.11 kg/m.  Physical Exam:   Physical Exam  Constitutional: He is oriented to person, place, and time. He appears well-developed and well-nourished. No distress.  HENT:  Head: Normocephalic and atraumatic.  Right Ear: External ear normal.  Left Ear: External ear normal.  Nose: Nose normal.  Mouth/Throat: Oropharynx is clear and moist.  Eyes: Pupils are equal, round, and reactive to light. Conjunctivae and EOM are normal.  Neck: Normal range of motion. Neck supple.  Cardiovascular: Normal rate, regular rhythm, normal heart sounds and intact distal pulses.  Pulmonary/Chest: Effort normal and breath sounds normal.  Abdominal:  Soft. Bowel sounds are normal.  Musculoskeletal: Normal range of motion.  Neurological: He is alert and oriented to person, place, and time.  Skin: Skin is warm and dry.  Psychiatric: He has a normal mood and affect. His behavior is normal. Judgment and thought content normal.  Nursing note and vitals reviewed.  Results for orders placed or performed in visit on 07/04/18  POCT Urinalysis Dipstick (Automated)  Result Value Ref Range   Color, UA Yellow    Clarity, UA  Cloudy    Glucose, UA Negative Negative   Bilirubin, UA Negative    Ketones, UA Negative    Spec Grav, UA 1.020 1.010 - 1.025   Blood, UA Negative    pH, UA 6.0 5.0 - 8.0   Protein, UA Negative Negative   Urobilinogen, UA 0.2 0.2 or 1.0 E.U./dL   Nitrite, UA Negative    Leukocytes, UA Moderate (2+) (A) Negative  POCT glycosylated hemoglobin (Hb A1C)  Result Value Ref Range   Hemoglobin A1C 6.3 (A) 4.0 - 5.6 %   HbA1c POC (<> result, manual entry)  4.0 - 5.6 %   HbA1c, POC (prediabetic range)  5.7 - 6.4 %   HbA1c, POC (controlled diabetic range)  0.0 - 7.0 %   Assessment and Plan:   Martavius was seen today for follow-up.  Diagnoses and all orders for this visit:  Type 2 diabetes mellitus without complication, without long-term current use of insulin (HCC) -     Microalbumin / creatinine urine ratio -     POCT Urinalysis Dipstick (Automated) -     aspirin EC 81 MG tablet; Take 1 tablet (81 mg total) by mouth daily. -     losartan (COZAAR) 50 MG tablet; Take 0.5 tablets (25 mg total) by mouth daily.  Myalgia due to statin  Dysuria -     POCT glycosylated hemoglobin (Hb A1C) -     cephALEXin (KEFLEX) 500 MG capsule; Take 1 capsule (500 mg total) by mouth 3 (three) times daily for 5 days.  . Reviewed expectations re: course of current medical issues. . Discussed self-management of symptoms. . Outlined signs and symptoms indicating need for more acute intervention. . Patient verbalized understanding and all  questions were answered. Marland Kitchen Health Maintenance issues including appropriate healthy diet, exercise, and smoking avoidance were discussed with patient. . See orders for this visit as documented in the electronic medical record. . Patient received an After Visit Summary.  Briscoe Deutscher, DO [place] [place] 07/04/2018  Future Appointments  Date Time Provider [place]  09/20/2018 10:00 AM Neldon Mc, Donnamarie Poag, MD [facility] None  10/03/2018 10:00 AM Briscoe Deutscher, DO [facility]   CMA served as scribe during this visit. History, Physical, and Plan performed by medical provider. The above documentation has been reviewed and is accurate and complete. Briscoe Deutscher, D.O.

## 2018-08-03 ENCOUNTER — Ambulatory Visit: Payer: Self-pay

## 2018-08-03 ENCOUNTER — Ambulatory Visit: Payer: 59 | Admitting: Sports Medicine

## 2018-08-03 ENCOUNTER — Encounter: Payer: Self-pay | Admitting: Sports Medicine

## 2018-08-03 VITALS — BP 112/76 | HR 94 | Ht 64.0 in | Wt 221.2 lb

## 2018-08-03 DIAGNOSIS — M1711 Unilateral primary osteoarthritis, right knee: Secondary | ICD-10-CM | POA: Diagnosis not present

## 2018-08-03 DIAGNOSIS — M25461 Effusion, right knee: Secondary | ICD-10-CM

## 2018-08-03 DIAGNOSIS — M25561 Pain in right knee: Secondary | ICD-10-CM

## 2018-08-03 NOTE — Progress Notes (Signed)
[name]. Joas Motton, [facility]  CONWAY FEDORA - 62 y.o. male MRN [id number]  Date of birth: 1955-12-31  Visit Date: 08/03/2018  PCP: Briscoe Deutscher, DO   Referred by: Briscoe Deutscher, DO  Scribe(s) for today's visit: Wendy Poet, LAT, ATC  SUBJECTIVE:  LEONDRE TAUL is here for Follow-up (R knee pain) .    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 He was last seen 06/07/2017 and received steroid injection. Synovial fluid was aspirated and came back negative for gout.   Pt reports improvement with knee pain since receiving injection. He hasn't noticed any swelling in the knee. Pain will flare-up after a long weekend. He has been wearing compression if he knows that he will be on his feet for extended period of time. 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 Going up and down stairs at church still gives him a little trouble. When he does have pain it is still on the medial aspect of the knee. He reports that overall he feels that he is doing significantly well. He notices the pain on occasion but its not even bad enough that he feels like he needs to take an Aleve.   08/03/2018: Compared to the last office visit on 08/08/17, his previously described R knee pain symptoms are worsening over the past couple months.  He states that his R knee is swollen currently and is having a hard time bending his R knee.  He states that he was moving recently and had to go up and down a lot of stairs to move out of his old home. Current symptoms are moderate & are nonradiating He has been taking Aleve prn and had a depomedrol injection.  R knee XR - 03/14/17   REVIEW OF SYSTEMS: Denies night time disturbances. Denies fevers, chills, or night sweats. Denies unexplained weight loss. Denies personal history of cancer. Denies  changes in bowel or bladder habits. Denies recent unreported falls. Reports new or worsening dyspnea or wheezing.  Asthma-related Denies headaches or dizziness.  Reports numbness, tingling or weakness  In the extremities - some in the R thigh Denies dizziness or presyncopal episodes Reports lower extremity edema    HISTORY:  Prior history reviewed and updated per electronic medical record.  Social History   Occupational History  . Not on file  Tobacco Use  . Smoking status: Former Research scientist (life sciences)  . Smokeless tobacco: Never Used  Substance and Sexual Activity  . Alcohol use: Yes    Alcohol/week: 1.0 standard drinks    Types: 1 Shots of liquor per week    Comment: rare  . Drug use: No  . Sexual activity: Not Currently   Social History   Social History Narrative  . Not on file    DATA OBTAINED & REVIEWED:   Recent Labs    09/20/17 1051 04/03/18 1013 07/04/18 0959  HGBA1C 6.5 6.6 6.3*   . X-ray 03/14/2017 right knee: Minimal degenerative changes.  Peaking of the tibial spines. . Serial ultrasound-guided aspirations and injections do show continued deterioration with persistent effusion. .   OBJECTIVE:  VS:  HT:5\' 4"  (162.6 cm)   WT:221 lb 3.2 oz (100.3 kg)  BMI:37.95    BP:112/76  HR:94bpm  TEMP: ( )  RESP:94 %   PHYSICAL EXAM: CONSTITUTIONAL: Well-developed, Well-nourished and In  no acute distress PSYCHIATRIC: Alert & appropriately interactive. and Not depressed or anxious appearing. RESPIRATORY: No increased work of breathing and Trachea Midline EYES: Pupils are equal., EOM intact without nystagmus. and No scleral icterus.  VASCULAR EXAM: Warm and well perfused NEURO: unremarkable  MSK Exam: Right knee  Well aligned, no significant deformity. Epidermal cyst overlying the anterior knee, unchanging. TTP over Medial and lateral joint lines.  Moderate effusion, generalized synovitis.   RANGE OF MOTION & STRENGTH  Extensor mechanism strength intact      SPECIALITY TESTING:  Pain with McMurray's.  Ligamentously stable.     ASSESSMENT   1. Right knee pain, unspecified chronicity   2. Effusion of right knee   3. Primary osteoarthritis of right knee     PLAN:  Pertinent additional documentation may be included in corresponding procedure notes, imaging studies, problem based documentation and patient instructions.  Procedures:  . US Guided Injection per procedure note  Medications:  No orders of the defined types were placed in this encounter.  Discussion/Instructions: No problem-specific Assessment & Plan notes found for this encounter.  . Persistent ongoing intermittent effusions with degenerative changes most notably with patellofemoral arthritic changes.  This does seem to be progressive since his most recent x-rays and 2018. Marland Kitchen Discussed red flag symptoms that warrant earlier emergent evaluation and patient voices understanding. . Activity modifications and the importance of avoiding exacerbating activities (limiting pain to no more than a 4 / 10 during or following activity) recommended and discussed.  Follow-up:  . Return if symptoms worsen or fail to improve.   . If any lack of improvement consider: further diagnostic evaluation with Repeat x-rays and consideration of MRI.  .      CMA/ATC served as Education administrator during this visit. History, Physical, and Plan performed by medical provider. Documentation and orders reviewed and attested to.      Gerda Diss, [facility] Physician

## 2018-08-03 NOTE — Procedures (Signed)
PROCEDURE NOTE:  Ultrasound Guided: Aspiration and Injection: Right knee Images were obtained and interpreted by myself, Teresa Coombs, DO  Images have been saved and stored to PACS system. Images obtained on: GE S7 Ultrasound machine    ULTRASOUND FINDINGS:  Moderate effusion, tricompartmental degenerative changes.  DESCRIPTION OF PROCEDURE:  [name] clinical condition is marked by substantial pain and/or significant functional disability. Other conservative therapy has not provided relief, is contraindicated, or not appropriate. There is a reasonable likelihood that injection will significantly improve the patient's pain and/or functional impairment.   After discussing the risks, benefits and expected outcomes of the injection and all questions were reviewed and answered, the patient wished to undergo the above named procedure.  Verbal consent was obtained.  The ultrasound was used to identify the target structure and adjacent neurovascular structures. The skin was then prepped in sterile fashion and the target structure was injected under direct visualization using sterile technique as below:  Single injection performed as below: PREP: Alcohol, Ethel Chloride and 5 cc 1% lidocaine on 25g 1.5 in. needle APPROACH:superiolateral, stopcock technique, 18g 1.5 in. INJECTATE: 2 cc 0.5% Marcaine and 2 cc 40mg /mL DepoMedrol ASPIRATE: 67mL  and straw colored  DRESSING: Band-Aid and 6-inch Ace Wrap  Post procedural instructions including recommending icing and warning signs for infection were reviewed.    This procedure was well tolerated and there were no complications.   IMPRESSION: Succesful Ultrasound Guided: Aspiration and Injection

## 2018-08-03 NOTE — Patient Instructions (Signed)

## 2018-08-15 ENCOUNTER — Encounter: Payer: Self-pay | Admitting: Sports Medicine

## 2018-08-15 DIAGNOSIS — M1711 Unilateral primary osteoarthritis, right knee: Secondary | ICD-10-CM | POA: Insufficient documentation

## 2018-08-16 DIAGNOSIS — L432 Lichenoid drug reaction: Secondary | ICD-10-CM | POA: Diagnosis not present

## 2018-08-22 ENCOUNTER — Encounter: Payer: Self-pay | Admitting: Family Medicine

## 2018-08-22 ENCOUNTER — Encounter: Payer: Self-pay | Admitting: Allergy and Immunology

## 2018-09-19 ENCOUNTER — Encounter: Payer: Self-pay | Admitting: Allergy and Immunology

## 2018-09-19 ENCOUNTER — Other Ambulatory Visit: Payer: Self-pay | Admitting: Allergy and Immunology

## 2018-09-19 ENCOUNTER — Ambulatory Visit: Payer: 59 | Admitting: Allergy and Immunology

## 2018-09-19 VITALS — BP 132/74 | HR 84 | Resp 18 | Ht 64.5 in | Wt 222.6 lb

## 2018-09-19 DIAGNOSIS — L2089 Other atopic dermatitis: Secondary | ICD-10-CM | POA: Diagnosis not present

## 2018-09-19 DIAGNOSIS — K219 Gastro-esophageal reflux disease without esophagitis: Secondary | ICD-10-CM | POA: Diagnosis not present

## 2018-09-19 DIAGNOSIS — J4550 Severe persistent asthma, uncomplicated: Secondary | ICD-10-CM | POA: Diagnosis not present

## 2018-09-19 DIAGNOSIS — J3089 Other allergic rhinitis: Secondary | ICD-10-CM | POA: Diagnosis not present

## 2018-09-19 DIAGNOSIS — Z23 Encounter for immunization: Secondary | ICD-10-CM

## 2018-09-19 NOTE — Patient Instructions (Addendum)
  1. Continue a combination of:   A. Qvar 80 Redihaler - 2 inhalations twice a day.   BJearl Klinefelter - one inhalation 1 time per day  C. montelukast 10 mg daily  D. Dupilumab injections every 2 weeks  E.  Omeprazole 20 mg 1 time per day  2. Continue Claritin and omeprazole and ProAir REDIHALER and topical treatment if needed  3.  Increase Qvar to 3 inhalations 3 times per day as part of "action plan" for flareup  4. Return to clinic in 6 months or earlier if problem  5.  Influenza vaccine administered in clinic today.

## 2018-09-19 NOTE — Progress Notes (Signed)
Follow-up Note  Referring Provider: Briscoe Deutscher, DO Primary Provider: Briscoe Deutscher, DO Date of Office Visit: 09/19/2018  Subjective:   Aaron Drake (DOB: 1956-01-28) is a 62 y.o. male who returns to the [facility] on 09/19/2018 in re-evaluation of the following:  HPI: Aaron Drake returns to this clinic in reevaluation of his COPD with component of asthma, allergic rhinitis, history of atopic dermatitis, and history of reflux.  His last visit to this clinic was 21 March 2018.  He has had an excellent interval of time without the requirement for systemic steroid or antibiotic to treat any type of respiratory tract issue.  He still continues to use a bronchodilator 1 time per day in addition to his Qvar and Anoro.  He does believe that the use of dupilumab has resulted in very significant improvement regarding his asthma control.  Prior to dupilumab he was using his bronchodilator about 5 times a day.  He has had very little issues with his upper airways as well.  Reflux has been under excellent control at this point in time.  He has not had any issues with his skin.  Allergies as of 09/19/2018      Reactions   Lipitor [atorvastatin] Other (See Comments)   Muscle cramps and joint ache      Medication List      albuterol 108 (90 Base) MCG/ACT inhaler Commonly known as:  PROVENTIL HFA;VENTOLIN HFA Inhale 2 puffs into the lungs every 4 (four) hours as needed for wheezing or shortness of breath.   aspirin EC 81 MG tablet Take 1 tablet (81 mg total) by mouth daily.   beclomethasone 80 MCG/ACT inhaler Commonly known as:  QVAR Inhale two doses twice daily to prevent cough or wheeze.  Rinse, gargle, and spit after use.   CALCIUM + D3 PO Take 1 tablet by mouth daily.   COMBIVENT RESPIMAT 20-100 MCG/ACT Aers respimat Generic drug:  Ipratropium-Albuterol USE 1 INHALATION ORALLY    EVERY 6 HOURS   CRANBERRY PO Take 2-3 capsules by mouth daily.   dexamethasone 0.5  MG/5ML solution Commonly known as:  DECADRON PLEASE SEE ATTACHED FOR DETAILED DIRECTIONS   DUPIXENT 300 MG/2ML Sosy Generic drug:  Dupilumab INJECT 2 SYRINGES SUBCUTANEOUSLY ON DAY 1, THEN INJECT 1 SYRINGE ON DAY 15 , THEN 1 SYRINGE EVERY OTHER WEEK THEREAFTER.   DUPIXENT [place] Inject 1 application into the skin.   dutasteride 0.5 MG capsule Commonly known as:  AVODART Take 1 capsule (0.5 mg total) by mouth daily.   GLUCOSAMINE CHONDROITIN JOINT Tabs Take 1 tablet by mouth daily.   levocetirizine 5 MG tablet Commonly known as:  XYZAL Take 5 mg by mouth every evening.   losartan 50 MG tablet Commonly known as:  COZAAR Take 0.5 tablets (25 mg total) by mouth daily.   montelukast 10 MG tablet Commonly known as:  SINGULAIR Take 1 tablet (10 mg total) by mouth daily.   multivitamin with minerals Tabs tablet Take 1 tablet by mouth daily.   naproxen sodium 220 MG tablet Commonly known as:  ALEVE Take 1 tablet (220 mg total) by mouth every 8 (eight) hours as needed (For pain.).   omeprazole 20 MG capsule Commonly known as:  PRILOSEC Take 1 capsule (20 mg total) by mouth daily.   Semaglutide (1 MG/DOSE) 2 MG/1.5ML Sopn Inject 1.5 mg into the skin once a week.   tamsulosin 0.4 MG Caps capsule Commonly known as:  FLOMAX Take 1 capsule by mouth  daily.   TURMERIC PO Take by mouth daily.   umeclidinium-vilanterol 62.5-25 MCG/INH Aepb Commonly known as:  ANORO ELLIPTA Inhale 1 puff into the lungs daily.       Past Medical History:  Diagnosis Date  . Allergy   . Arthritis   . Asthma 05/12/2012  . BPH (benign prostatic hyperplasia) 05/19/2012  . Diabetes mellitus without complication ([place])   . Dyspnea    physical stress; exercise   . History of kidney stones   . Hyperlipidemia   . Impaired glucose tolerance 05/19/2012  . Tear of meniscus of right knee 2018   partial tear    Past Surgical History:  Procedure Laterality Date  . CATARACT EXTRACTION Right   .  EXTRACORPOREAL SHOCK WAVE LITHOTRIPSY Left 05/08/2018   Procedure: LEFT EXTRACORPOREAL SHOCK WAVE LITHOTRIPSY (ESWL);  Surgeon: [name], MD;  Location: [name];  Service: Urology;  Laterality: Left;  . INSERTION OF MESH N/A 05/17/2016   Procedure: INSERTION OF MESH;  Surgeon: Rolm Bookbinder, MD;  Location: [facility];  Service: General;  Laterality: N/A;  . UMBILICAL HERNIA REPAIR N/A 05/17/2016   Procedure: UMBILICAL HERNIA REPAIR;  Surgeon: Rolm Bookbinder, MD;  Location: [facility];  Service: General;  Laterality: N/A;  . WISDOM TOOTH EXTRACTION      Review of systems negative except as noted in HPI / PMHx or noted below:  Review of Systems  Constitutional: Negative.   HENT: Negative.   Eyes: Negative.   Respiratory: Negative.   Cardiovascular: Negative.   Gastrointestinal: Negative.   Genitourinary: Negative.   Musculoskeletal: Negative.   Skin: Negative.   Neurological: Negative.   Endo/Heme/Allergies: Negative.   Psychiatric/Behavioral: Negative.      Objective:   Vitals:   09/19/18 1120  BP: 132/74  Pulse: 84  Resp: 18  SpO2: 98%   Height: 5' 4.5" (163.8 cm)  Weight: 222 lb 9.6 oz (101 kg)   Physical Exam  HENT:  Head: Normocephalic.  Right Ear: Tympanic membrane, external ear and ear canal normal.  Left Ear: Tympanic membrane, external ear and ear canal normal.  Nose: Nose normal. No mucosal edema or rhinorrhea.  Mouth/Throat: Uvula is midline, oropharynx is clear and moist and mucous membranes are normal. No oropharyngeal exudate.  Eyes: Conjunctivae are normal.  Neck: Trachea normal. No tracheal tenderness present. No tracheal deviation present. No thyromegaly present.  Cardiovascular: Normal rate, regular rhythm, S1 normal, S2 normal and normal heart sounds.  No murmur heard. Pulmonary/Chest: Breath sounds normal. No stridor. No respiratory distress. He has no wheezes. He has no rales.  Musculoskeletal: He exhibits no edema.  Lymphadenopathy:        Head (right side): No tonsillar adenopathy present.       Head (left side): No tonsillar adenopathy present.    He has no cervical adenopathy.  Neurological: He is alert.  Skin: No rash noted. He is not diaphoretic. No erythema. Nails show no clubbing.    Diagnostics:    Spirometry was performed and demonstrated an FEV1 of 1.91 at 66 % of predicted.  The patient had an Asthma Control Test with the following results: ACT Total Score: 25.    Assessment and Plan:   1. Asthma, severe persistent, well-controlled   2. Other allergic rhinitis   3. Other atopic dermatitis   4. Gastroesophageal reflux disease, esophagitis presence not specified   5. Need for immunization against influenza     1. Continue a combination of:   A. Qvar 80 Redihaler - 2  inhalations twice a day.   BJearl Klinefelter - one inhalation 1 time per day  C. montelukast 10 mg daily  D. Dupilumab injections every 2 weeks  E.  Omeprazole 20 mill grams 1 time per day  2. Continue Claritin and omeprazole and ProAir REDIHALER and topical treatment if needed  3.  Increase Qvar to 3 inhalations 3 times per day as part of "action plan" for flareup  4. Return to clinic in 6 months or earlier if problem  5.  Influenza vaccine administered in clinic today.  Once again Aaron Drake has had excellent control of his respiratory tract inflammatory condition on his current plan which does include the use of dupilumab.  As well, it sounds as though his reflux is under very good control at this point time while using a proton pump inhibitor.  He will continue on this plan and I will see him back in his clinic in 6 months or earlier if there is a problem.  Allena Katz, MD Allergy / Immunology [place]

## 2018-09-20 ENCOUNTER — Ambulatory Visit: Payer: 59 | Admitting: Allergy and Immunology

## 2018-09-20 ENCOUNTER — Encounter: Payer: Self-pay | Admitting: Allergy and Immunology

## 2018-10-03 ENCOUNTER — Ambulatory Visit: Payer: 59 | Admitting: Family Medicine

## 2018-10-25 NOTE — Progress Notes (Signed)
Aaron Drake is a 62 y.o. male is here for follow up.  History of Present Illness:   Aaron Drake, CMA acting as scribe for Dr. Briscoe Drake.   HPI:   Type 2 diabetes mellitus without complication, without long-term current use of insulin (HCC) Medication compliance: compliant all of the time, diabetic diet compliance: compliant all of the time, home glucose monitoring: is not performed, further diabetic ROS: no polyuria or polydipsia, no chest pain, dyspnea or TIA's, no numbness, tingling or pain in extremities.  Health Maintenance Due  Topic Date Due  . OPHTHALMOLOGY EXAM  10/01/1966   Depression screen [facility] 2/9 04/03/2018 03/21/2017 11/11/2016  Decreased Interest 0 0 0  Down, Depressed, Hopeless 0 0 1  PHQ - 2 Score 0 0 1   PMHx, SurgHx, SocialHx, FamHx, Medications, and Allergies were reviewed in the Visit Navigator and updated as appropriate.   Patient Active Problem List   Diagnosis Date Noted  . ASCVD risk score 20.3% 10/30/2018  . Primary osteoarthritis of right knee 08/15/2018  . Myalgia due to statin 07/04/2018  . Renal calculi 06/21/2018  . Positive colorectal cancer screening using Cologuard test 04/07/2017  . Morbid obesity ([place]) 03/26/2017  . Type 2 diabetes mellitus without complication, without long-term current use of insulin ([place]) 03/26/2017  . Effusion of right knee 03/14/2017  . Left rotator cuff tear arthropathy 01/28/2014  . BPH (benign prostatic hyperplasia) 05/19/2012  . Hyperlipidemia associated with type 2 diabetes mellitus ([place])   . Asthma 05/12/2012   Social History   Tobacco Use  . Smoking status: Former Research scientist (life sciences)  . Smokeless tobacco: Never Used  Substance Use Topics  . Alcohol use: Yes    Alcohol/week: 1.0 standard drinks    Types: 1 Shots of liquor per week    Comment: rare  . Drug use: No   Current Medications and Allergies:   .  albuterol (PROAIR HFA) 108 (90 Base) MCG/ACT inhaler, Inhale 2 puffs into the lungs every 4 (four)  hours as needed for wheezing or shortness of breath., Disp: 3 Inhaler, Rfl: 0 .  aspirin EC 81 MG tablet, Take 1 tablet (81 mg total) by mouth daily., Disp: 90 tablet, Rfl: 3 .  Calcium Carb-Cholecalciferol (CALCIUM + D3 PO), Take 1 tablet by mouth daily., Disp: , Rfl:  .  COMBIVENT RESPIMAT 20-100 MCG/ACT AERS respimat, USE 1 INHALATION ORALLY    EVERY 6 HOURS, Disp: 12 g .  CRANBERRY PO, Take 2-3 capsules by mouth daily., Disp: , Rfl:  .  dexamethasone (DECADRON) 0.5 MG/5ML solution, PLEASE SEE ATTACHED FOR DETAILED DIRECTIONS, Disp: , Rfl: 1 .  Dupilumab, Asthma, (DUPIXENT [place]), Inject 1 application into the skin., Disp: , Rfl:  .  DUPIXENT 300 MG/2ML SOSY, INJECT 2 SYRINGES SUBCUTANEOUSLY ON DAY 1, THEN INJECT 1 SYRINGE ON DAY 15 , THEN 1 SYRINGE EVERY OTHER WEEK THEREAFTER., Disp: 2 Syringe, Rfl: 11 .  dutasteride (AVODART) 0.5 MG capsule, Take 1 capsule (0.5 mg total) by mouth daily., Disp: 90 capsule, Rfl: 3 .  Glucos-Chondroit-Hyaluron-MSM (GLUCOSAMINE CHONDROITIN JOINT) TABS, Take 1 tablet by mouth daily., Disp: , Rfl:  .  levocetirizine (XYZAL) 5 MG tablet, Take 5 mg by mouth every evening., Disp: , Rfl:  .  losartan (COZAAR) 50 MG tablet, Take 0.5 tablets (25 mg total) by mouth daily., Disp: 45 tablet, Rfl: 3 .  montelukast (SINGULAIR) 10 MG tablet, Take 1 tablet (10 mg total) by mouth daily., Disp: 90 tablet, Rfl: 5 .  Multiple Vitamin (  MULTIVITAMIN WITH MINERALS) TABS tablet, Take 1 tablet by mouth daily., Disp: , Rfl:  .  naproxen sodium (ALEVE) 220 MG tablet, Take 1 tablet (220 mg total) by mouth every 8 (eight) hours as needed  .  omeprazole (PRILOSEC) 20 MG capsule, Take 1 capsule (20 mg total) by mouth daily., Disp: 90 capsule, Rfl: 1 .  QVAR REDIHALER 80 MCG/ACT inhaler, USE 2 INHALATIONS   .  Semaglutide (OZEMPIC) 1 MG/DOSE SOPN, Inject 1.5 mg into the skin once a week., Disp: 4 pen, Rfl: 6 .  tamsulosin (FLOMAX) 0.4 MG CAPS capsule, Take 1 capsule by mouth daily., Disp: , Rfl:    .  TURMERIC PO, Take by mouth daily., Disp: , Rfl:  .  umeclidinium-vilanterol (ANORO ELLIPTA) 62.5-25 MCG/INH AEPB, Inhale 1 puff into the lungs daily., Disp: 84 each, Rfl: 5   Allergies  Allergen Reactions  . Lipitor [Atorvastatin] Other (See Comments)    Muscle cramps and joint ache   Review of Systems   Pertinent items are noted in the HPI. Otherwise, ROS is negative.  Vitals:   Vitals:   10/30/18 1012  BP: 134/76  Pulse: 87  Temp: 98.2 F (36.8 C)  TempSrc: Oral  SpO2: 94%  Weight: 229 lb 6.4 oz (104.1 kg)  Height: 5' 4.5" (1.638 m)     Body mass index is 38.77 kg/m.  Physical Exam:   Physical Exam  Constitutional: He appears well-developed and well-nourished. No distress.  HENT:  Head: Normocephalic and atraumatic.  Right Ear: External ear normal.  Left Ear: External ear normal.  Nose: Nose normal.  Mouth/Throat: Oropharynx is clear and moist.  Eyes: Pupils are equal, round, and reactive to light. Conjunctivae and EOM are normal.  Neck: Neck supple.  Cardiovascular: Normal rate, regular rhythm and intact distal pulses.  Pulmonary/Chest: Effort normal.  Abdominal: Soft. Bowel sounds are normal.  Musculoskeletal: Normal range of motion.  Neurological: He is alert.  Skin: Skin is warm.  Psychiatric: He has a normal mood and affect. His behavior is normal.  Nursing note and vitals reviewed.  Diabetic Foot Exam - Simple   Simple Foot Form Diabetic Foot exam was performed with the following findings:  Yes 10/30/2018 10:23 AM  Visual Inspection No deformities, no ulcerations, no other skin breakdown bilaterally:  Yes Sensation Testing Intact to touch and monofilament testing bilaterally:  Yes Pulse Check Posterior Tibialis and Dorsalis pulse intact bilaterally:  Yes Comments     Assessment and Plan:   Aaron Drake was seen today for follow-up.  Diagnoses and all orders for this visit:  Type 2 diabetes mellitus without complication, without long-term  current use of insulin ([place]) Comments: Doing very well. A1c continues to improve. Tolerating medications. Orders: -     POCT glycosylated hemoglobin (Hb A1C)  ASCVD risk score >15% Comments: The 10-year ASCVD risk score Mikey Bussing DC Brooke Bonito., et al., 2013) is: 17.8%   Values used to calculate the score:     Age: 34 years     Sex: Male     Is Non-Hispanic African American: No     Diabetic: Yes     Tobacco smoker: No     Systolic Blood Pressure: 939 mmHg     Is BP treated: No     HDL Cholesterol: 70.5 mg/dL     Total Cholesterol: 181 mg/dL  Hyperlipidemia associated with type 2 diabetes mellitus ([place]) Comments: Not on statin due to myalgia. I recommend LIPID CLINIC referral. Orders: -     Lipid panel -  Comp Met (CMET)  Morbid obesity ([place]) Comments: Discussed healthy food choices during the holiday season.  Myalgia due to statin Comments: Offerd referral to [facility]. Will se what latest FLP looks like.   . Reviewed expectations re: course of current medical issues. . Discussed self-management of symptoms. . Outlined signs and symptoms indicating need for more acute intervention. . Patient verbalized understanding and all questions were answered. Marland Kitchen Health Maintenance issues including appropriate healthy diet, exercise, and smoking avoidance were discussed with patient. . See orders for this visit as documented in the electronic medical record. . Patient received an After Visit Summary.  CMA served as Education administrator during this visit. History, Physical, and Plan performed by medical provider. The above documentation has been reviewed and is accurate and complete. Aaron Drake, D.O.  Aaron Deutscher, DO [place], [facility] 10/31/2018

## 2018-10-30 ENCOUNTER — Encounter: Payer: Self-pay | Admitting: Family Medicine

## 2018-10-30 ENCOUNTER — Ambulatory Visit: Payer: 59 | Admitting: Family Medicine

## 2018-10-30 VITALS — BP 134/76 | HR 87 | Temp 98.2°F | Ht 64.5 in | Wt 229.4 lb

## 2018-10-30 DIAGNOSIS — Z9189 Other specified personal risk factors, not elsewhere classified: Secondary | ICD-10-CM | POA: Insufficient documentation

## 2018-10-30 DIAGNOSIS — E119 Type 2 diabetes mellitus without complications: Secondary | ICD-10-CM

## 2018-10-30 DIAGNOSIS — T466X5A Adverse effect of antihyperlipidemic and antiarteriosclerotic drugs, initial encounter: Secondary | ICD-10-CM

## 2018-10-30 DIAGNOSIS — E785 Hyperlipidemia, unspecified: Secondary | ICD-10-CM | POA: Diagnosis not present

## 2018-10-30 DIAGNOSIS — E1169 Type 2 diabetes mellitus with other specified complication: Secondary | ICD-10-CM | POA: Diagnosis not present

## 2018-10-30 DIAGNOSIS — M791 Myalgia, unspecified site: Secondary | ICD-10-CM

## 2018-10-30 LAB — COMPREHENSIVE METABOLIC PANEL
ALT: 24 U/L (ref 0–53)
AST: 13 U/L (ref 0–37)
Albumin: 4.2 g/dL (ref 3.5–5.2)
Alkaline Phosphatase: 66 U/L (ref 39–117)
BUN: 21 mg/dL (ref 6–23)
CO2: 25 mEq/L (ref 19–32)
Calcium: 9.8 mg/dL (ref 8.4–10.5)
Chloride: 101 mEq/L (ref 96–112)
Creatinine, Ser: 0.88 mg/dL (ref 0.40–1.50)
GFR: 92.64 mL/min (ref >60.00)
Glucose, Bld: 124 mg/dL — ABNORMAL HIGH (ref 70–99)
Potassium: 4.2 mEq/L (ref 3.5–5.1)
Sodium: 136 mEq/L (ref 135–145)
Total Bilirubin: 0.4 mg/dL (ref 0.2–1.2)
Total Protein: 6.7 g/dL (ref 6.0–8.3)

## 2018-10-30 LAB — LIPID PANEL
Cholesterol: 181 mg/dL (ref 0–200)
HDL: 70.5 mg/dL (ref >39.00)
LDL Cholesterol: 91 mg/dL (ref 0–99)
NonHDL: 110.34
Total CHOL/HDL Ratio: 3
Triglycerides: 95 mg/dL (ref 0.0–149.0)
VLDL: 19 mg/dL (ref 0.0–40.0)

## 2018-10-30 LAB — POCT GLYCOSYLATED HEMOGLOBIN (HGB A1C): Hemoglobin A1C: 7.1 % — AB (ref 4.0–5.6)

## 2018-10-31 ENCOUNTER — Encounter: Payer: Self-pay | Admitting: Allergy and Immunology

## 2018-10-31 ENCOUNTER — Encounter: Payer: Self-pay | Admitting: Family Medicine

## 2018-11-07 ENCOUNTER — Encounter: Payer: Self-pay | Admitting: Allergy and Immunology

## 2018-11-07 MED ORDER — ALBUTEROL SULFATE 108 (90 BASE) MCG/ACT IN AEPB: 2.0000 | INHALATION_SPRAY | RESPIRATORY_TRACT | 0 refills | Status: DC | PRN

## 2018-11-30 ENCOUNTER — Other Ambulatory Visit: Payer: Self-pay | Admitting: Allergy and Immunology

## 2018-12-27 DIAGNOSIS — Z961 Presence of intraocular lens: Secondary | ICD-10-CM | POA: Diagnosis not present

## 2018-12-27 DIAGNOSIS — H40013 Open angle with borderline findings, low risk, bilateral: Secondary | ICD-10-CM | POA: Diagnosis not present

## 2018-12-27 DIAGNOSIS — H2512 Age-related nuclear cataract, left eye: Secondary | ICD-10-CM | POA: Diagnosis not present

## 2018-12-27 LAB — HM DIABETES EYE EXAM

## 2019-01-03 ENCOUNTER — Encounter: Payer: Self-pay | Admitting: Physician Assistant

## 2019-01-11 ENCOUNTER — Other Ambulatory Visit: Payer: Self-pay | Admitting: Allergy and Immunology

## 2019-01-12 ENCOUNTER — Other Ambulatory Visit: Payer: Self-pay

## 2019-01-12 ENCOUNTER — Encounter: Payer: Self-pay | Admitting: Family Medicine

## 2019-01-12 MED ORDER — IPRATROPIUM-ALBUTEROL 20-100 MCG/ACT IN AERS: INHALATION_SPRAY | RESPIRATORY_TRACT | 3 refills | Status: DC

## 2019-01-16 ENCOUNTER — Other Ambulatory Visit: Payer: Self-pay

## 2019-01-16 MED ORDER — IPRATROPIUM-ALBUTEROL 20-100 MCG/ACT IN AERS: INHALATION_SPRAY | RESPIRATORY_TRACT | 3 refills | Status: DC

## 2019-01-18 ENCOUNTER — Telehealth: Payer: Self-pay | Admitting: Family Medicine

## 2019-01-18 ENCOUNTER — Other Ambulatory Visit: Payer: Self-pay

## 2019-01-18 NOTE — Telephone Encounter (Signed)
See note

## 2019-01-18 NOTE — Telephone Encounter (Signed)
Okay duoneb or anora ellipta

## 2019-01-18 NOTE — Telephone Encounter (Signed)
Copied from [place] [phone]. Topic: Quick Communication - See Telephone Encounter >> Jan 18, 2019  1:23 PM Rutherford Nail, [place] wrote: CRM for notification. See Telephone encounter for: 01/18/19. Mardene Celeste with [facility] calling and states that a prior authorization is needed for the Ipratropium-Albuterol (COMBIVENT RESPIMAT) 20-100 MCG/ACT AERS respimat . States that there are alternatives that could be prescribed if the provider is okay with that. The following meds are alternates that are covered: duoned, anoro ellipta, bevesi, and stioloto.  PA number - [phone] Number for Mardene Celeste [phone] Ref#: [id number]

## 2019-01-18 NOTE — Telephone Encounter (Signed)
Called change to Cammie Sickle per pharmacy will be sent out now. My chart message sent to patient to let him know.

## 2019-01-18 NOTE — Telephone Encounter (Signed)
Is one of them ok to change to?

## 2019-01-24 MED ORDER — IPRATROPIUM BROMIDE HFA 17 MCG/ACT IN AERS: 2.0000 | INHALATION_SPRAY | Freq: Four times a day (QID) | RESPIRATORY_TRACT | 12 refills | Status: DC | PRN

## 2019-01-25 ENCOUNTER — Other Ambulatory Visit: Payer: Self-pay | Admitting: Allergy and Immunology

## 2019-01-25 ENCOUNTER — Other Ambulatory Visit: Payer: Self-pay | Admitting: Family Medicine

## 2019-01-28 DIAGNOSIS — L438 Other lichen planus: Secondary | ICD-10-CM | POA: Insufficient documentation

## 2019-01-28 NOTE — Progress Notes (Signed)
Aaron Drake is a 63 y.o. male is here for follow up.  History of Present Illness:   HPI: See Assessment and Plan section for Problem Based Charting of issues discussed today.   There are no preventive care reminders to display for this patient. Depression screen [facility] 2/9 04/03/2018 03/21/2017 11/11/2016  Decreased Interest 0 0 0  Down, Depressed, Hopeless 0 0 1  PHQ - 2 Score 0 0 1   PMHx, SurgHx, SocialHx, FamHx, Medications, and Allergies were reviewed in the Visit Navigator and updated as appropriate.   Patient Active Problem List   Diagnosis Date Noted  . Erosive oral lichen planus, Dx via Bx, Tx with Dexamethasone rinse, followed by Periodontist 01/28/2019  . ASCVD risk score > 15% 10/30/2018  . Primary osteoarthritis of right knee 08/15/2018  . Myalgia due to statin 07/04/2018  . Renal calculi 06/21/2018  . Positive colorectal cancer screening using Cologuard test 04/07/2017  . Morbid obesity ([place]) 03/26/2017  . Type 2 diabetes mellitus without complication, without long-term current use of insulin ([place]) 03/26/2017  . Effusion of right knee 03/14/2017  . Left rotator cuff tear arthropathy 01/28/2014  . BPH (benign prostatic hyperplasia) 05/19/2012  . Hyperlipidemia associated with type 2 diabetes mellitus ([place])   . Asthma 05/12/2012   Social History   Tobacco Use  . Smoking status: Former Research scientist (life sciences)  . Smokeless tobacco: Never Used  Substance Use Topics  . Alcohol use: Yes    Alcohol/week: 1.0 standard drinks    Types: 1 Shots of liquor per week    Comment: rare  . Drug use: No   Current Medications and Allergies:   .  Albuterol Sulfate 108 (90 Base) MCG/ACT AEPB, USE 2 INHALATIONS ORALLY   INTO THE LUNGS EVERY 4     HOURS AS NEEDED, Disp: 1 each, Rfl: 1 .  aspirin EC 81 MG tablet, Take 1 tablet (81 mg total) by mouth daily., Disp: 90 tablet, Rfl: 3 .  Calcium Carb-Cholecalciferol (CALCIUM + D3 PO), Take 1 tablet by mouth daily., Disp: , Rfl:  .  CRANBERRY PO, Take  2-3 capsules by mouth daily., Disp: , Rfl:  .  dexamethasone (DECADRON) 0.5 MG/5ML solution, PLEASE SEE ATTACHED FOR DETAILED DIRECTIONS, Disp: , Rfl: 1 .  Dupilumab, Asthma, (DUPIXENT [place]), Inject 1 application into the skin., Disp: , Rfl:  .  DUPIXENT 300 MG/2ML SOSY, INJECT 2 SYRINGES SUBCUTANEOUSLY ON DAY 1, THEN INJECT 1 SYRINGE ON DAY 15 , THEN 1 SYRINGE EVERY OTHER WEEK THEREAFTER., Disp: 2 Syringe, Rfl: 11 .  dutasteride (AVODART) 0.5 MG capsule, TAKE 1 CAPSULE DAILY, Disp: 90 capsule, Rfl: 3 .  Glucos-Chondroit-Hyaluron-MSM (GLUCOSAMINE CHONDROITIN JOINT) TABS, Take 1 tablet by mouth daily., Disp: , Rfl:  .  ipratropium (ATROVENT HFA) 17 MCG/ACT inhaler, Inhale 2 puffs into the lungs every 6 (six) hours as needed for wheezing., Disp: 1 Inhaler, Rfl: 12 .  levocetirizine (XYZAL) 5 MG tablet, Take 5 mg by mouth every evening., Disp: , Rfl:  .  losartan (COZAAR) 50 MG tablet, Take 0.5 tablets (25 mg total) by mouth daily., Disp: 45 tablet, Rfl: 3 .  montelukast (SINGULAIR) 10 MG tablet, Take 1 tablet (10 mg total) by mouth daily., Disp: 90 tablet, Rfl: 5 .  Multiple Vitamin (MULTIVITAMIN WITH MINERALS) TABS tablet, Take 1 tablet by mouth daily., Disp: , Rfl:  .  naproxen sodium (ALEVE) 220 MG tablet, Take 1 tablet (220 mg total) by mouth every 8 (eight) hours as needed (For pain.)., Disp: ,  Rfl:  .  omeprazole (PRILOSEC) 20 MG capsule, TAKE 1 CAPSULE DAILY, Disp: 90 capsule, Rfl: 0 .  QVAR REDIHALER 80 MCG/ACT inhaler, USE 2 INHALATIONS ORALLY   TWICE DAILY TO PREVENT     COUGH OR WHEEZE; RINSE,    GARGLE AND SPIT AFTER USE, Disp: 31.8 g, Rfl: 1 .  Semaglutide (OZEMPIC) 1 MG/DOSE SOPN, Inject 1.5 mg into the skin once a week., Disp: 4 pen, Rfl: 6 .  tamsulosin (FLOMAX) 0.4 MG CAPS capsule, Take 1 capsule by mouth daily., Disp: , Rfl:  .  TURMERIC PO, Take by mouth daily., Disp: , Rfl:  .  umeclidinium-vilanterol (ANORO ELLIPTA) 62.5-25 MCG/INH AEPB, Inhale 1 puff into the lungs daily., Disp: 84  each, Rfl: 5   Allergies  Allergen Reactions  . Lipitor [Atorvastatin] Other (See Comments)    Muscle cramps and joint ache   Review of Systems   Pertinent items are noted in the HPI. Otherwise, ROS is negative.  Vitals:   Vitals:   01/29/19 1004  BP: 126/74  Pulse: 93  Temp: 98.3 F (36.8 C)  TempSrc: Oral  SpO2: 94%  Weight: 221 lb 12.8 oz (100.6 kg)  Height: 5\' 5"  (1.651 m)     Body mass index is 36.91 kg/m.  Physical Exam:   Physical Exam Vitals signs and nursing note reviewed.  Constitutional:      General: He is not in acute distress.    Appearance: He is well-developed.  HENT:     Head: Normocephalic and atraumatic.     Right Ear: External ear normal.     Left Ear: External ear normal.     Nose: Nose normal.  Eyes:     Conjunctiva/sclera: Conjunctivae normal.     Pupils: Pupils are equal, round, and reactive to light.  Neck:     Musculoskeletal: Neck supple.  Cardiovascular:     Rate and Rhythm: Normal rate and regular rhythm.  Pulmonary:     Effort: Pulmonary effort is normal.  Abdominal:     General: Bowel sounds are normal.     Palpations: Abdomen is soft.  Musculoskeletal: Normal range of motion.  Skin:    General: Skin is warm.  Neurological:     Mental Status: He is alert.  Psychiatric:        Behavior: Behavior normal.    Assessment and Plan:   Type 2 diabetes mellitus without complication, without long-term current use of insulin (HCC) Doing well.  Tolerating current medications without side effects.  A1c is down to 6.4 today.  He is also down 8 pounds since her last visit.  He is working on maintaining a plant-based low carbohydrate diet.  We will continue current treatment.  Morbid obesity ([place]) Patient is down 8 pounds since her last visit.  He is working on maintaining a diabetic diet.  He is not currently exercising regularly.  Hyperlipidemia associated with type 2 diabetes mellitus ([place]) Patient intolerant of statins.   Watching diet.  Blood sugar numbers are improving.  We have discussed the lipid clinic prior to today.  Will recheck lipids at next visit and decide at that time.  Orders Placed This Encounter  Procedures  . POCT HgB A1C   . Reviewed expectations re: course of current medical issues. . Discussed self-management of symptoms. . Outlined signs and symptoms indicating need for more acute intervention. . Patient verbalized understanding and all questions were answered. Marland Kitchen Health Maintenance issues including appropriate healthy diet, exercise, and smoking avoidance were  discussed with patient. . See orders for this visit as documented in the electronic medical record. . Patient received an After Visit Summary.  Briscoe Deutscher, DO [place], [facility] 01/29/2019

## 2019-01-29 ENCOUNTER — Other Ambulatory Visit: Payer: Self-pay

## 2019-01-29 ENCOUNTER — Encounter: Payer: Self-pay | Admitting: Allergy and Immunology

## 2019-01-29 ENCOUNTER — Encounter: Payer: Self-pay | Admitting: Family Medicine

## 2019-01-29 ENCOUNTER — Ambulatory Visit: Payer: 59 | Admitting: Family Medicine

## 2019-01-29 VITALS — BP 126/74 | HR 93 | Temp 98.3°F | Ht 65.0 in | Wt 221.8 lb

## 2019-01-29 DIAGNOSIS — E119 Type 2 diabetes mellitus without complications: Secondary | ICD-10-CM | POA: Diagnosis not present

## 2019-01-29 DIAGNOSIS — E1169 Type 2 diabetes mellitus with other specified complication: Secondary | ICD-10-CM | POA: Diagnosis not present

## 2019-01-29 DIAGNOSIS — E785 Hyperlipidemia, unspecified: Secondary | ICD-10-CM | POA: Diagnosis not present

## 2019-01-29 LAB — POCT GLYCOSYLATED HEMOGLOBIN (HGB A1C): Hemoglobin A1C: 6.4 % — AB (ref 4.0–5.6)

## 2019-01-29 MED ORDER — ALBUTEROL SULFATE 108 (90 BASE) MCG/ACT IN AEPB: 2.0000 | INHALATION_SPRAY | RESPIRATORY_TRACT | 0 refills | Status: DC | PRN

## 2019-01-29 MED ORDER — ALBUTEROL SULFATE HFA 108 (90 BASE) MCG/ACT IN AERS: 2.0000 | INHALATION_SPRAY | RESPIRATORY_TRACT | 2 refills | Status: DC | PRN

## 2019-01-29 NOTE — Assessment & Plan Note (Signed)
Patient intolerant of statins.  Watching diet.  Blood sugar numbers are improving.  We have discussed the lipid clinic prior to today.  Will recheck lipids at next visit and decide at that time.

## 2019-01-29 NOTE — Assessment & Plan Note (Signed)
Patient is down 8 pounds since her last visit.  He is working on maintaining a diabetic diet.  He is not currently exercising regularly.

## 2019-01-29 NOTE — Assessment & Plan Note (Signed)
Doing well.  Tolerating current medications without side effects.  A1c is down to 6.4 today.  He is also down 8 pounds since her last visit.  He is working on maintaining a plant-based low carbohydrate diet.  We will continue current treatment.

## 2019-01-29 NOTE — Telephone Encounter (Signed)
proair respiclick is being requested. It is a non-formulary  Alternatives included:  Albuterol HFA, Levalbuterol

## 2019-01-29 NOTE — Addendum Note (Signed)
Addended by: Lucrezia Starch I on: 01/29/2019 05:33 PM   Modules accepted: Orders

## 2019-01-31 ENCOUNTER — Telehealth: Payer: Self-pay

## 2019-01-31 NOTE — Telephone Encounter (Signed)
Patient's insurance is requiring a prior authorization for Proair Respiclick. I am submitting this via covermymeds.

## 2019-01-31 NOTE — Telephone Encounter (Signed)
PA denied due to other SABAs being covered under plan he needs to try and fail 3 or more alternatives and valid documentation must be provided

## 2019-01-31 NOTE — Telephone Encounter (Signed)
Please provide patient a albuterol MDI inhaler to replace his pro-air that has approval from his insurance company and inform him about this change.

## 2019-02-01 MED ORDER — ALBUTEROL SULFATE HFA 108 (90 BASE) MCG/ACT IN AERS: INHALATION_SPRAY | RESPIRATORY_TRACT | 1 refills | Status: DC

## 2019-02-01 NOTE — Addendum Note (Signed)
Addended by: Farrel Demark R on: 02/01/2019 09:05 AM   Modules accepted: Orders

## 2019-02-01 NOTE — Telephone Encounter (Signed)
Sent in new prescription for albuterol inhaler. Also called and left a message letting [place] know the prescription change, and stated if he had any further questions to please return call.

## 2019-02-09 MED ORDER — UMECLIDINIUM-VILANTEROL 62.5-25 MCG/INH IN AEPB: 1.0000 | INHALATION_SPRAY | Freq: Every day | RESPIRATORY_TRACT | 1 refills | Status: DC

## 2019-02-09 NOTE — Addendum Note (Signed)
Addended by: Horris Latino on: 02/09/2019 09:38 AM   Modules accepted: Orders

## 2019-02-28 ENCOUNTER — Other Ambulatory Visit: Payer: Self-pay | Admitting: Allergy and Immunology

## 2019-03-20 ENCOUNTER — Other Ambulatory Visit: Payer: Self-pay | Admitting: Allergy and Immunology

## 2019-03-20 ENCOUNTER — Ambulatory Visit: Payer: 59 | Admitting: Allergy and Immunology

## 2019-03-21 ENCOUNTER — Encounter: Payer: Self-pay | Admitting: Allergy and Immunology

## 2019-03-21 ENCOUNTER — Ambulatory Visit (INDEPENDENT_AMBULATORY_CARE_PROVIDER_SITE_OTHER): Payer: 59 | Admitting: Allergy and Immunology

## 2019-03-21 ENCOUNTER — Other Ambulatory Visit: Payer: Self-pay

## 2019-03-21 DIAGNOSIS — J3089 Other allergic rhinitis: Secondary | ICD-10-CM

## 2019-03-21 DIAGNOSIS — K219 Gastro-esophageal reflux disease without esophagitis: Secondary | ICD-10-CM | POA: Diagnosis not present

## 2019-03-21 DIAGNOSIS — J4550 Severe persistent asthma, uncomplicated: Secondary | ICD-10-CM

## 2019-03-21 DIAGNOSIS — L2089 Other atopic dermatitis: Secondary | ICD-10-CM

## 2019-03-21 NOTE — Progress Notes (Signed)
[facility]   Follow-up Note  Referring Provider: Briscoe Deutscher, DO Primary Provider: Briscoe Deutscher, DO Date of Office Visit: 03/21/2019  Subjective:   Aaron Drake (DOB: May 10, 1956) is a 63 y.o. male who returns to the [facility] on 03/21/2019 in re-evaluation of the following:  HPI: This is a E - Med visit requested by patient who is located at home.  Aaron Drake is followed in this clinic for COPD with component of asthma, allergic rhinitis, history of atopic dermatitis, and history of reflux.  His last visit to this clinic was 19 September 2018.  Good control of breathing. No systemic steroids or antibiotics. Walking without problem. SABA about 1 time per day. No Action Plan. Using dupilumab without problem.  No nose problems while using montelukast.  Skin good with hand lotion.   Reflux good with PPI.  Allergies as of 03/21/2019      Reactions   Lipitor [atorvastatin] Other (See Comments)   Muscle cramps and joint ache      Medication List      Albuterol Sulfate 108 (90 Base) MCG/ACT Aepb Commonly known as:  ProAir RespiClick Inhale 2 puffs into the lungs every 4 (four) hours as needed.   albuterol 108 (90 Base) MCG/ACT inhaler Commonly known as:  VENTOLIN HFA INHALE 2 PUFFS INTO THE LUNGS EVERY 4 HOURS AS NEEDED FOR WHEEZING OR SHORTNESS OF BREATH   aspirin EC 81 MG tablet Take 1 tablet (81 mg total) by mouth daily.   CALCIUM + D3 PO Take 1 tablet by mouth daily.   CRANBERRY PO Take 2-3 capsules by mouth daily.   Dupixent 300 MG/2ML Sosy Generic drug:  Dupilumab INJECT 2 SYRINGES SUBCUTANEOUSLY ON DAY 1, THEN INJECT 1 SYRINGE ON DAY 15 , THEN 1 SYRINGE EVERY OTHER WEEK THEREAFTER.   dutasteride 0.5 MG capsule Commonly known as:  AVODART TAKE 1 CAPSULE DAILY   ipratropium 17 MCG/ACT inhaler Commonly known as:  ATROVENT HFA Inhale 2 puffs into the lungs every 6 (six) hours as needed for wheezing.   levocetirizine 5 MG tablet Commonly known as:  XYZAL Take 5 mg by mouth every evening.   losartan 50 MG tablet Commonly known as:  COZAAR Take 0.5 tablets (25 mg total) by mouth daily.   montelukast 10 MG tablet Commonly known as:  SINGULAIR Take 1 tablet (10 mg total) by mouth daily.   multivitamin with minerals Tabs tablet Take 1 tablet by mouth daily.   naproxen sodium 220 MG tablet Commonly known as:  Aleve Take 1 tablet (220 mg total) by mouth every 8 (eight) hours as needed (For pain.).   omeprazole 20 MG capsule Commonly known as:  PRILOSEC TAKE 1 CAPSULE DAILY   Qvar RediHaler 80 MCG/ACT inhaler Generic drug:  beclomethasone USE 2 INHALATIONS ORALLY   TWICE DAILY TO PREVENT     COUGH OR WHEEZE; RINSE,    GARGLE AND SPIT AFTER USE   Semaglutide (1 MG/DOSE) 2 MG/1.5ML Sopn Commonly known as:  Ozempic (1 MG/DOSE) Inject 1.5 mg into the skin once a week.   tamsulosin 0.4 MG Caps capsule Commonly known as:  FLOMAX Take 1 capsule by mouth daily.   umeclidinium-vilanterol 62.5-25 MCG/INH Aepb Commonly known as:  Anoro Ellipta Inhale 1 puff into the lungs daily.       Past Medical History:  Diagnosis Date   Allergy    Arthritis    Asthma 05/12/2012   BPH (benign  prostatic hyperplasia) 05/19/2012   Diabetes mellitus without complication (HCC)    Dyspnea    physical stress; exercise    History of kidney stones    Hyperlipidemia    Impaired glucose tolerance 05/19/2012   Tear of meniscus of right knee 2018   partial tear    Past Surgical History:  Procedure Laterality Date   CATARACT EXTRACTION Right    EXTRACORPOREAL SHOCK WAVE LITHOTRIPSY Left 05/08/2018   Procedure: LEFT EXTRACORPOREAL SHOCK WAVE LITHOTRIPSY (ESWL);  Surgeon: [name], MD;  Location: [name];  Service: Urology;  Laterality: Left;   INSERTION OF MESH N/A 05/17/2016   Procedure: INSERTION OF MESH;  Surgeon: Rolm Bookbinder, MD;  Location: [facility];  Service: General;   Laterality: N/A;   UMBILICAL HERNIA REPAIR N/A 05/17/2016   Procedure: UMBILICAL HERNIA REPAIR;  Surgeon: Rolm Bookbinder, MD;  Location: [facility];  Service: General;  Laterality: N/A;   WISDOM TOOTH EXTRACTION      Review of systems negative except as noted in HPI / PMHx or noted below:  Review of Systems  Constitutional: Negative.   HENT: Negative.   Eyes: Negative.   Respiratory: Negative.   Cardiovascular: Negative.   Gastrointestinal: Negative.   Genitourinary: Negative.   Musculoskeletal: Negative.   Skin: Negative.   Neurological: Negative.   Endo/Heme/Allergies: Negative.   Psychiatric/Behavioral: Negative.      Objective:   There were no vitals filed for this visit.        Physical Exam-deferred  Diagnostics: None  Assessment and Plan:   1. Asthma, severe persistent, well-controlled   2. Other allergic rhinitis   3. Other atopic dermatitis   4. Gastroesophageal reflux disease, esophagitis presence not specified     1. Continue a combination of:   A. Qvar 80 Redihaler - 2 inhalations twice a day.   BJearl Klinefelter - one inhalation 1 time per day  C. montelukast 10 mg daily  D. Dupilumab injections every 2 weeks  E. Omeprazole 20 mg 1 time per day  2. Continue Claritin and omeprazole and ProAir REDIHALER and topical treatment if needed  3.  Increase Qvar to 3 inhalations 3 times per day as part of "action plan" for flareup  4. Return to clinic in 6 months or earlier if problem  Aaron Drake appears to be doing relatively well on his current plan of action and he will remain on a combination of anti-inflammatory agents for both his upper and lower airway and continue to treat reflux and we will see him back in this clinic in 6 months or earlier if there is a problem. Total paient interaction time 15 minutes.  Allena Katz, MD Allergy / Immunology [facility]

## 2019-03-21 NOTE — Patient Instructions (Signed)
  1. Continue a combination of:   A. Qvar 80 Redihaler - 2 inhalations twice a day.   BJearl Klinefelter - one inhalation 1 time per day  C. montelukast 10 mg daily  D. Dupilumab injections every 2 weeks  E. Omeprazole 20 mg 1 time per day  2. Continue Claritin and omeprazole and ProAir REDIHALER and topical treatment if needed  3.  Increase Qvar to 3 inhalations 3 times per day as part of "action plan" for flareup  4. Return to clinic in 6 months or earlier if problem

## 2019-03-22 ENCOUNTER — Encounter: Payer: Self-pay | Admitting: Allergy and Immunology

## 2019-03-27 NOTE — Progress Notes (Signed)
Patient is at home.  Provider is in the office.  Start time: 1:32 pm End Time: 1:44 pm Verbal consent to bill insurance given.

## 2019-03-28 ENCOUNTER — Other Ambulatory Visit: Payer: Self-pay

## 2019-03-28 ENCOUNTER — Encounter: Payer: Self-pay | Admitting: Allergy and Immunology

## 2019-03-28 MED ORDER — EPINEPHRINE 0.3 MG/0.3ML IJ SOAJ: 0.3000 mg | INTRAMUSCULAR | 3 refills | Status: DC | PRN

## 2019-04-05 ENCOUNTER — Encounter: Payer: Self-pay | Admitting: Family Medicine

## 2019-04-16 ENCOUNTER — Other Ambulatory Visit: Payer: Self-pay | Admitting: Allergy and Immunology

## 2019-04-26 ENCOUNTER — Telehealth: Payer: Self-pay | Admitting: Family Medicine

## 2019-04-26 NOTE — Telephone Encounter (Signed)
Patient wanted to see if his appt could be in office instead of virtual, he wanted to talk about a UTI he thinks he might have and he needs his A1C checked.

## 2019-04-27 NOTE — Telephone Encounter (Signed)
Called patient let him know that not due for A1c we will be able to see on doxy no other questions at this time.

## 2019-04-30 ENCOUNTER — Encounter: Payer: Self-pay | Admitting: Family Medicine

## 2019-04-30 ENCOUNTER — Other Ambulatory Visit: Payer: Self-pay

## 2019-04-30 ENCOUNTER — Ambulatory Visit (INDEPENDENT_AMBULATORY_CARE_PROVIDER_SITE_OTHER): Payer: 59 | Admitting: Family Medicine

## 2019-04-30 VITALS — Ht 65.0 in | Wt 221.0 lb

## 2019-04-30 DIAGNOSIS — I152 Hypertension secondary to endocrine disorders: Secondary | ICD-10-CM | POA: Insufficient documentation

## 2019-04-30 DIAGNOSIS — E1169 Type 2 diabetes mellitus with other specified complication: Secondary | ICD-10-CM

## 2019-04-30 DIAGNOSIS — T466X5A Adverse effect of antihyperlipidemic and antiarteriosclerotic drugs, initial encounter: Secondary | ICD-10-CM

## 2019-04-30 DIAGNOSIS — R300 Dysuria: Secondary | ICD-10-CM

## 2019-04-30 DIAGNOSIS — Z9189 Other specified personal risk factors, not elsewhere classified: Secondary | ICD-10-CM

## 2019-04-30 DIAGNOSIS — M791 Myalgia, unspecified site: Secondary | ICD-10-CM

## 2019-04-30 DIAGNOSIS — E119 Type 2 diabetes mellitus without complications: Secondary | ICD-10-CM

## 2019-04-30 DIAGNOSIS — E1159 Type 2 diabetes mellitus with other circulatory complications: Secondary | ICD-10-CM | POA: Insufficient documentation

## 2019-04-30 DIAGNOSIS — E785 Hyperlipidemia, unspecified: Secondary | ICD-10-CM

## 2019-04-30 DIAGNOSIS — I10 Essential (primary) hypertension: Secondary | ICD-10-CM

## 2019-04-30 MED ORDER — DOXYCYCLINE HYCLATE 100 MG PO TABS: 100.0000 mg | ORAL_TABLET | Freq: Two times a day (BID) | ORAL | 1 refills | Status: DC

## 2019-04-30 NOTE — Progress Notes (Signed)
Virtual Visit via Video   Due to the COVID-19 pandemic, this visit was completed with telemedicine (audio/video) technology to reduce patient and provider exposure as well as to preserve personal protective equipment.   I connected with Aaron Drake by a video enabled telemedicine application and verified that I am speaking with the correct person using two identifiers. Location patient: Home Location provider: [facility], Office Persons participating in the virtual visit: Aaron Drake, [place], DO Aaron Drake, CMA acting as scribe for Dr. Briscoe Deutscher.   I discussed the limitations of evaluation and management by telemedicine and the availability of in person appointments. The patient expressed understanding and agreed to proceed.  Care Team   Patient Care Team: Briscoe Deutscher, DO as PCP - General (Family Medicine) Alyson Ingles Candee Furbish, MD as Consulting Physician (Urology)  Subjective:   HPI: Patient had UTI symptoms. Started about one week about one week ago. He has had ongoing frequency with urination as well as urgency. No fever or other symptoms.   1. Morbid obesity ([place])   2. ASCVD risk score > 15%   3. Myalgia due to statin   4. Hyperlipidemia associated with type 2 diabetes mellitus ([place])   5. Type 2 diabetes mellitus without complication, without long-term current use of insulin ([place])   6. Hypertension associated with diabetes ([place])    Medication compliance: compliant all of the time, diabetic diet compliance: compliant all of the time, home glucose monitoring: is not performed, further diabetic ROS: no chest pain, dyspnea or TIA's, no numbness, tingling or pain in extremities.   Lab Results  Component Value Date   HGBA1C 6.4 (A) 01/29/2019   HGBA1C 7.1 (A) 10/30/2018   HGBA1C 6.3 (A) 07/04/2018   Lab Results  Component Value Date   MICROALBUR 5.0 (H) 07/04/2018   LDLCALC 91 10/30/2018   CREATININE 0.88 10/30/2018   Review of Systems    Constitutional: Negative for chills and fever.  HENT: Negative for ear pain and tinnitus.   Eyes: Negative for blurred vision and double vision.  Respiratory: Negative for cough and hemoptysis.   Cardiovascular: Negative for chest pain and palpitations.  Gastrointestinal: Negative for nausea and vomiting.  Genitourinary: Positive for dysuria, frequency and urgency.  Musculoskeletal: Negative for myalgias and neck pain.  Skin: Negative for rash.  Neurological: Negative for dizziness and headaches.  Endo/Heme/Allergies: Does not bruise/bleed easily.  Psychiatric/Behavioral: Negative for depression and suicidal ideas.    Patient Active Problem List   Diagnosis Date Noted  . Hypertension associated with diabetes ([place]) 04/30/2019  . Erosive oral lichen planus, Dx via Bx, Tx with Dexamethasone rinse, followed by Periodontist 01/28/2019  . ASCVD risk score > 15% 10/30/2018  . Primary osteoarthritis of right knee 08/15/2018  . Myalgia due to statin 07/04/2018  . Renal calculi 06/21/2018  . Positive colorectal cancer screening using Cologuard test 04/07/2017  . Morbid obesity ([place]) 03/26/2017  . Type 2 diabetes mellitus without complication, without long-term current use of insulin ([place]) 03/26/2017  . Effusion of right knee 03/14/2017  . Left rotator cuff tear arthropathy 01/28/2014  . BPH (benign prostatic hyperplasia) 05/19/2012  . Hyperlipidemia associated with type 2 diabetes mellitus ([place])   . Asthma 05/12/2012    Social History   Tobacco Use  . Smoking status: Former Research scientist (life sciences)  . Smokeless tobacco: Never Used  Substance Use Topics  . Alcohol use: Yes    Alcohol/week: 1.0 standard drinks    Types: 1 Shots of liquor per week  Comment: rare    Current Outpatient Medications:  .  albuterol (VENTOLIN HFA) 108 (90 Base) MCG/ACT inhaler, INHALE 2 PUFFS BY MOUTH EVERY 4 HOURS AS NEEDED FOR WHEEZE OR FOR SHORTNESS OF BREATH, Disp: 6.7 Inhaler, Rfl: 1 .  Albuterol Sulfate (PROAIR  RESPICLICK) 536 (90 Base) MCG/ACT AEPB, Inhale 2 puffs into the lungs every 4 (four) hours as needed., Disp: 3 each, Rfl: 0 .  aspirin EC 81 MG tablet, Take 1 tablet (81 mg total) by mouth daily., Disp: 90 tablet, Rfl: 3 .  Calcium Carb-Cholecalciferol (CALCIUM + D3 PO), Take 1 tablet by mouth daily., Disp: , Rfl:  .  CRANBERRY PO, Take 2-3 capsules by mouth daily., Disp: , Rfl:  .  DUPIXENT 300 MG/2ML SOSY, INJECT 2 SYRINGES SUBCUTANEOUSLY ON DAY 1, THEN INJECT 1 SYRINGE ON DAY 15 , THEN 1 SYRINGE EVERY OTHER WEEK THEREAFTER. (Patient taking differently: Inject 1 mg into the skin every 14 (fourteen) days. ), Disp: 2 Syringe, Rfl: 11 .  dutasteride (AVODART) 0.5 MG capsule, TAKE 1 CAPSULE DAILY, Disp: 90 capsule, Rfl: 3 .  EPINEPHrine (EPIPEN 2-PAK) 0.3 mg/0.3 mL IJ SOAJ injection, Inject 0.3 mLs (0.3 mg total) into the muscle as needed for anaphylaxis., Disp: 4 Device, Rfl: 3 .  ipratropium (ATROVENT HFA) 17 MCG/ACT inhaler, Inhale 2 puffs into the lungs every 6 (six) hours as needed for wheezing., Disp: 1 Inhaler, Rfl: 12 .  levocetirizine (XYZAL) 5 MG tablet, Take 5 mg by mouth every evening., Disp: , Rfl:  .  losartan (COZAAR) 50 MG tablet, Take 0.5 tablets (25 mg total) by mouth daily., Disp: 45 tablet, Rfl: 3 .  montelukast (SINGULAIR) 10 MG tablet, Take 1 tablet (10 mg total) by mouth daily., Disp: 90 tablet, Rfl: 5 .  Multiple Vitamin (MULTIVITAMIN WITH MINERALS) TABS tablet, Take 1 tablet by mouth daily., Disp: , Rfl:  .  naproxen sodium (ALEVE) 220 MG tablet, Take 1 tablet (220 mg total) by mouth every 8 (eight) hours as needed (For pain.)., Disp: , Rfl:  .  omeprazole (PRILOSEC) 20 MG capsule, TAKE 1 CAPSULE DAILY, Disp: 90 capsule, Rfl: 1 .  QVAR REDIHALER 80 MCG/ACT inhaler, USE 2 INHALATIONS ORALLY   TWICE DAILY TO PREVENT     COUGH OR WHEEZE; RINSE,    GARGLE AND SPIT AFTER USE, Disp: 31.8 g, Rfl: 1 .  Semaglutide (OZEMPIC) 1 MG/DOSE SOPN, Inject 1.5 mg into the skin once a week., Disp:  4 pen, Rfl: 6 .  tamsulosin (FLOMAX) 0.4 MG CAPS capsule, Take 1 capsule by mouth daily., Disp: , Rfl:  .  umeclidinium-vilanterol (ANORO ELLIPTA) 62.5-25 MCG/INH AEPB, Inhale 1 puff into the lungs daily., Disp: 90 each, Rfl: 1 .  doxycycline (VIBRA-TABS) 100 MG tablet, Take 1 tablet (100 mg total) by mouth 2 (two) times daily., Disp: 20 tablet, Rfl: 1  Allergies  Allergen Reactions  . Lipitor [Atorvastatin] Other (See Comments)    Muscle cramps and joint ache   Objective:   VITALS: Per patient if applicable, see vitals. GENERAL: Alert, appears well and in no acute distress. HEENT: Atraumatic, conjunctiva clear, no obvious abnormalities on inspection of external nose and ears. NECK: Normal movements of the head and neck. CARDIOPULMONARY: No increased WOB. Speaking in clear sentences. I:E ratio WNL.  MS: Moves all visible extremities without noticeable abnormality. PSYCH: Pleasant and cooperative, well-groomed. Speech normal rate and rhythm. Affect is appropriate. Insight and judgement are appropriate. Attention is focused, linear, and appropriate.  NEURO: CN grossly intact. Oriented  as arrived to appointment on time with no prompting. Moves both UE equally.  SKIN: No obvious lesions, wounds, erythema, or cyanosis noted on face or hands.  Depression screen [facility] 2/9 04/30/2019 04/03/2018 03/21/2017  Decreased Interest 0 0 0  Down, Depressed, Hopeless 0 0 0  PHQ - 2 Score 0 0 0  Altered sleeping 0 - -  Tired, decreased energy 0 - -  Change in appetite 0 - -  Feeling bad or failure about yourself  0 - -  Trouble concentrating 0 - -  Moving slowly or fidgety/restless 0 - -  Suicidal thoughts 0 - -  PHQ-9 Score 0 - -  Difficult doing work/chores Not difficult at all - -   Assessment and Plan:   Calem was seen today for follow-up.  Diagnoses and all orders for this visit:  Morbid obesity [facility]) The patient is asked to make an attempt to improve diet and exercise patterns to aid in  medical management of this problem.   ASCVD risk score > 15%  Myalgia due to statin  Hyperlipidemia associated with type 2 diabetes mellitus ([place]) Will continue to monitor. [facility] in the past.   Type 2 diabetes mellitus without complication, without long-term current use of insulin ([place]) Well controlled.  No signs of complications, medication side effects, or red flags.  Continue current regimen.    Hypertension associated with diabetes ([place]) Well controlled.  No signs of complications, medication side effects, or red flags.  Continue current regimen.    Dysuria -     doxycycline (VIBRA-TABS) 100 MG tablet; Take 1 tablet (100 mg total) by mouth 2 (two) times daily.   Marland Kitchen COVID-19 Education: The signs and symptoms of COVID-19 were discussed with the patient and how to seek care for testing if needed. The importance of social distancing was discussed today. . Reviewed expectations re: course of current medical issues. . Discussed self-management of symptoms. . Outlined signs and symptoms indicating need for more acute intervention. . Patient verbalized understanding and all questions were answered. Marland Kitchen Health Maintenance issues including appropriate healthy diet, exercise, and smoking avoidance were discussed with patient. . See orders for this visit as documented in the electronic medical record.  Briscoe Deutscher, DO  Records requested if needed. Time spent: 25 minutes, of which >50% was spent in obtaining information about his symptoms, reviewing his previous labs, evaluations, and treatments, counseling him about his condition (please see the discussed topics above), and developing a plan to further investigate it; he had a number of questions which I addressed.

## 2019-05-21 ENCOUNTER — Other Ambulatory Visit: Payer: Self-pay | Admitting: Allergy and Immunology

## 2019-06-09 ENCOUNTER — Encounter: Payer: Self-pay | Admitting: Family Medicine

## 2019-06-09 DIAGNOSIS — E119 Type 2 diabetes mellitus without complications: Secondary | ICD-10-CM

## 2019-06-11 MED ORDER — OZEMPIC (1 MG/DOSE) 2 MG/1.5ML ~~LOC~~ SOPN: 1.5000 mg | PEN_INJECTOR | SUBCUTANEOUS | 1 refills | Status: DC

## 2019-06-13 ENCOUNTER — Other Ambulatory Visit: Payer: Self-pay

## 2019-06-13 DIAGNOSIS — E119 Type 2 diabetes mellitus without complications: Secondary | ICD-10-CM

## 2019-06-13 MED ORDER — OZEMPIC (1 MG/DOSE) 2 MG/1.5ML ~~LOC~~ SOPN: 1.5000 mg | PEN_INJECTOR | SUBCUTANEOUS | 1 refills | Status: DC

## 2019-06-18 ENCOUNTER — Other Ambulatory Visit: Payer: Self-pay | Admitting: Family Medicine

## 2019-06-18 DIAGNOSIS — E119 Type 2 diabetes mellitus without complications: Secondary | ICD-10-CM

## 2019-06-20 ENCOUNTER — Other Ambulatory Visit: Payer: Self-pay

## 2019-06-20 ENCOUNTER — Telehealth: Payer: Self-pay | Admitting: Physical Therapy

## 2019-06-20 DIAGNOSIS — E119 Type 2 diabetes mellitus without complications: Secondary | ICD-10-CM

## 2019-06-20 MED ORDER — OZEMPIC (1 MG/DOSE) 2 MG/1.5ML ~~LOC~~ SOPN: 1.5000 mg | PEN_INJECTOR | SUBCUTANEOUS | 1 refills | Status: DC

## 2019-06-20 NOTE — Telephone Encounter (Signed)
Left a message for pt to call office back.  °

## 2019-06-20 NOTE — Progress Notes (Signed)
Resent today.  LDM for patient informing him.

## 2019-06-20 NOTE — Telephone Encounter (Signed)
Copied from [place] [id number]. Topic: General - Other >> Jun 20, 2019 11:50 AM Nils Flack wrote: Reason for CRM: pt is returning call to [facility]  Please call back

## 2019-06-22 ENCOUNTER — Telehealth: Payer: Self-pay | Admitting: Family Medicine

## 2019-06-22 ENCOUNTER — Other Ambulatory Visit: Payer: Self-pay

## 2019-06-22 DIAGNOSIS — E119 Type 2 diabetes mellitus without complications: Secondary | ICD-10-CM

## 2019-06-22 MED ORDER — OZEMPIC (1 MG/DOSE) 2 MG/1.5ML ~~LOC~~ SOPN: 1.5000 mg | PEN_INJECTOR | SUBCUTANEOUS | 1 refills | Status: DC

## 2019-06-22 NOTE — Telephone Encounter (Signed)
Need to verify direction instruction for Ozempic. Ref # [id number]

## 2019-06-22 NOTE — Telephone Encounter (Signed)
See note

## 2019-06-25 ENCOUNTER — Other Ambulatory Visit: Payer: Self-pay

## 2019-06-25 DIAGNOSIS — E119 Type 2 diabetes mellitus without complications: Secondary | ICD-10-CM

## 2019-06-25 MED ORDER — OZEMPIC (1 MG/DOSE) 2 MG/1.5ML ~~LOC~~ SOPN: 1.0000 mg | PEN_INJECTOR | SUBCUTANEOUS | 1 refills | Status: DC

## 2019-06-25 NOTE — Telephone Encounter (Signed)
Spoke to patient asked that it be sent to local I have done so

## 2019-06-25 NOTE — Telephone Encounter (Signed)
Informed pharmacy of clarification for medication. Inject 1mg  weekly subq.

## 2019-07-26 ENCOUNTER — Other Ambulatory Visit: Payer: Self-pay | Admitting: Allergy and Immunology

## 2019-07-26 ENCOUNTER — Encounter: Payer: Self-pay | Admitting: Family Medicine

## 2019-07-29 ENCOUNTER — Other Ambulatory Visit: Payer: Self-pay | Admitting: Allergy and Immunology

## 2019-07-30 ENCOUNTER — Encounter: Payer: Self-pay | Admitting: Family Medicine

## 2019-07-30 ENCOUNTER — Other Ambulatory Visit: Payer: Self-pay

## 2019-07-30 ENCOUNTER — Ambulatory Visit: Payer: 59 | Admitting: Family Medicine

## 2019-07-30 VITALS — BP 120/70 | HR 83 | Temp 98.0°F | Ht 65.0 in | Wt 217.4 lb

## 2019-07-30 DIAGNOSIS — E785 Hyperlipidemia, unspecified: Secondary | ICD-10-CM

## 2019-07-30 DIAGNOSIS — I10 Essential (primary) hypertension: Secondary | ICD-10-CM

## 2019-07-30 DIAGNOSIS — E1159 Type 2 diabetes mellitus with other circulatory complications: Secondary | ICD-10-CM | POA: Diagnosis not present

## 2019-07-30 DIAGNOSIS — E119 Type 2 diabetes mellitus without complications: Secondary | ICD-10-CM | POA: Diagnosis not present

## 2019-07-30 DIAGNOSIS — Z23 Encounter for immunization: Secondary | ICD-10-CM

## 2019-07-30 DIAGNOSIS — I152 Hypertension secondary to endocrine disorders: Secondary | ICD-10-CM

## 2019-07-30 DIAGNOSIS — E1169 Type 2 diabetes mellitus with other specified complication: Secondary | ICD-10-CM | POA: Diagnosis not present

## 2019-07-30 LAB — COMPREHENSIVE METABOLIC PANEL
ALT: 20 U/L (ref 0–53)
AST: 17 U/L (ref 0–37)
Albumin: 4.2 g/dL (ref 3.5–5.2)
Alkaline Phosphatase: 57 U/L (ref 39–117)
BUN: 18 mg/dL (ref 6–23)
CO2: 24 mEq/L (ref 19–32)
Calcium: 9.3 mg/dL (ref 8.4–10.5)
Chloride: 105 mEq/L (ref 96–112)
Creatinine, Ser: 0.91 mg/dL (ref 0.40–1.50)
GFR: 83.66 mL/min (ref >60.00)
Glucose, Bld: 91 mg/dL (ref 70–99)
Potassium: 4.1 mEq/L (ref 3.5–5.1)
Sodium: 138 mEq/L (ref 135–145)
Total Bilirubin: 0.3 mg/dL (ref 0.2–1.2)
Total Protein: 6.9 g/dL (ref 6.0–8.3)

## 2019-07-30 LAB — HEMOGLOBIN A1C: Hgb A1c MFr Bld: 6.4 % (ref 4.6–6.5)

## 2019-07-30 NOTE — Progress Notes (Signed)
Aaron Drake is a 63 y.o. male is here for follow up.  History of Present Illness:   HPI:   1. Type 2 diabetes mellitus without complication, without long-term current use of insulin (HCC) Lab Results  Component Value Date   HGBA1C 6.4 (A) 01/29/2019   HGBA1C 7.1 (A) 10/30/2018   HGBA1C 6.3 (A) 07/04/2018   Lab Results  Component Value Date   MICROALBUR 5.0 (H) 07/04/2018   LDLCALC 91 10/30/2018   CREATININE 0.88 10/30/2018   2. Hyperlipidemia associated with type 2 diabetes mellitus [facility]) Lab Results  Component Value Date   CHOL 181 10/30/2018   HDL 70.50 10/30/2018   LDLCALC 91 10/30/2018   LDLDIRECT 136.6 01/11/2014   TRIG 95.0 10/30/2018   CHOLHDL 3 10/30/2018   Lab Results  Component Value Date   ALT 24 10/30/2018   AST 13 10/30/2018   ALKPHOS 66 10/30/2018   BILITOT 0.4 10/30/2018   3. Hypertension associated with diabetes ([place]) BP Readings from Last 3 Encounters:  07/30/19 120/70  01/29/19 126/74  10/30/18 134/76   4. Morbid obesity ([place]) Wt Readings from Last 3 Encounters:  07/30/19 217 lb 6.4 oz (98.6 kg)  04/30/19 221 lb (100.2 kg)  01/29/19 221 lb 12.8 oz (100.6 kg)   There are no preventive care reminders to display for this patient.   Depression screen [facility] 2/9 07/30/2019 04/30/2019 04/03/2018  Decreased Interest 0 0 0  Down, Depressed, Hopeless 0 0 0  PHQ - 2 Score 0 0 0  Altered sleeping 0 0 -  Tired, decreased energy 0 0 -  Change in appetite 0 0 -  Feeling bad or failure about yourself  0 0 -  Trouble concentrating 0 0 -  Moving slowly or fidgety/restless 0 0 -  Suicidal thoughts 0 0 -  PHQ-9 Score 0 0 -  Difficult doing work/chores Not difficult at all Not difficult at all -   PMHx, SurgHx, SocialHx, FamHx, Medications, and Allergies were reviewed in the Visit Navigator and updated as appropriate.   Patient Active Problem List   Diagnosis Date Noted  . Hypertension associated with diabetes ([place]) 04/30/2019  . Erosive oral lichen  planus, Dx via Bx, Tx with Dexamethasone rinse, followed by Periodontist 01/28/2019  . ASCVD risk score > 15% 10/30/2018  . Primary osteoarthritis of right knee 08/15/2018  . Myalgia due to statin 07/04/2018  . Renal calculi 06/21/2018  . Positive colorectal cancer screening using Cologuard test 04/07/2017  . Morbid obesity ([place]) 03/26/2017  . Type 2 diabetes mellitus without complication, without long-term current use of insulin ([place]) 03/26/2017  . Effusion of right knee 03/14/2017  . Left rotator cuff tear arthropathy 01/28/2014  . BPH (benign prostatic hyperplasia) 05/19/2012  . Hyperlipidemia associated with type 2 diabetes mellitus ([place])   . Asthma 05/12/2012   Social History   Tobacco Use  . Smoking status: Former Research scientist (life sciences)  . Smokeless tobacco: Never Used  Substance Use Topics  . Alcohol use: Yes    Alcohol/week: 1.0 standard drinks    Types: 1 Shots of liquor per week    Comment: rare  . Drug use: No   Current Medications and Allergies   .  albuterol (VENTOLIN HFA) 108 (90 Base) MCG/ACT inhaler, INHALE 2 PUFFS BY MOUTH EVERY 4 HOURS AS NEEDED FOR WHEEZE OR FOR SHORTNESS OF BREATH, Disp: 18 g, Rfl: 1 .  Albuterol Sulfate (PROAIR RESPICLICK) 123XX123 (90 Base) MCG/ACT AEPB, Inhale 2 puffs into the lungs every 4 (four)  hours as needed., Disp: 3 each, Rfl: 0 .  alfuzosin (UROXATRAL) 10 MG 24 hr tablet, Take 10 mg by mouth daily with breakfast., Disp: , Rfl:  .  aspirin EC 81 MG tablet, Take 1 tablet (81 mg total) by mouth daily., Disp: 90 tablet, Rfl: 3 .  Calcium Carb-Cholecalciferol (CALCIUM + D3 PO), Take 1 tablet by mouth daily., Disp: , Rfl:  .  CRANBERRY PO, Take 2-3 capsules by mouth daily., Disp: , Rfl:  .  doxycycline (VIBRA-TABS) 100 MG tablet, Take 1 tablet (100 mg total) by mouth 2 (two) times daily., Disp: 20 tablet, Rfl: 1 .  DUPIXENT 300 MG/2ML SOSY, INJECT 2 SYRINGES SUBCUTANEOUSLY ON DAY 1, THEN INJECT 1 SYRINGE ON DAY 15 , THEN 1 SYRINGE EVERY OTHER WEEK THEREAFTER.  (Patient taking differently: Inject 1 mg into the skin every 14 (fourteen) days. ), Disp: 2 Syringe, Rfl: 11 .  dutasteride (AVODART) 0.5 MG capsule, TAKE 1 CAPSULE DAILY, Disp: 90 capsule, Rfl: 3 .  EPINEPHrine (EPIPEN 2-PAK) 0.3 mg/0.3 mL IJ SOAJ injection, Inject 0.3 mLs (0.3 mg total) into the muscle as needed for anaphylaxis., Disp: 4 Device, Rfl: 3 .  ipratropium (ATROVENT HFA) 17 MCG/ACT inhaler, Inhale 2 puffs into the lungs every 6 (six) hours as needed for wheezing., Disp: 1 Inhaler, Rfl: 12 .  levocetirizine (XYZAL) 5 MG tablet, Take 5 mg by mouth every evening., Disp: , Rfl:  .  losartan (COZAAR) 50 MG tablet, TAKE 1/2 TABLET (25MG       TOTAL) DAILY, Disp: 45 tablet, Rfl: 1 .  montelukast (SINGULAIR) 10 MG tablet, TAKE 1 TABLET DAILY, Disp: 90 tablet, Rfl: 0 .  Multiple Vitamin (MULTIVITAMIN WITH MINERALS) TABS tablet, Take 1 tablet by mouth daily., Disp: , Rfl:  .  naproxen sodium (ALEVE) 220 MG tablet, Take 1 tablet (220 mg total) by mouth every 8 (eight) hours as needed (For pain.)., Disp: , Rfl:  .  omeprazole (PRILOSEC) 20 MG capsule, TAKE 1 CAPSULE DAILY, Disp: 90 capsule, Rfl: 1 .  QVAR REDIHALER 80 MCG/ACT inhaler, USE 2 INHALATIONS ORALLY   TWICE DAILY TO PREVENT     COUGH OR WHEEZE; RINSE,    GARGLE AND SPIT AFTER USE, Disp: 31.8 g, Rfl: 1 .  umeclidinium-vilanterol (ANORO ELLIPTA) 62.5-25 MCG/INH AEPB, Inhale 1 puff into the lungs daily., Disp: 90 each, Rfl: 1 .  Semaglutide, 1 MG/DOSE, (OZEMPIC, 1 MG/DOSE,) 2 MG/1.5ML SOPN, Inject 1 mg into the skin once a week., Disp: 12 pen, Rfl: 1   Allergies  Allergen Reactions  . Lipitor [Atorvastatin] Other (See Comments)    Muscle cramps and joint ache   Review of Systems   Pertinent items are noted in the HPI. Otherwise, a complete ROS is negative.  Vitals   Vitals:   07/30/19 1022  BP: 120/70  Pulse: 83  Temp: 98 F (36.7 C)  TempSrc: Temporal  SpO2: 96%  Weight: 217 lb 6.4 oz (98.6 kg)  Height: 5\' 5"  (1.651 m)       Body mass index is 36.18 kg/m.  Physical Exam   Physical Exam Vitals signs and nursing note reviewed.  Constitutional:      General: He is not in acute distress.    Appearance: He is well-developed.  HENT:     Head: Normocephalic and atraumatic.     Right Ear: External ear normal.     Left Ear: External ear normal.     Nose: Nose normal.  Eyes:     Conjunctiva/sclera: Conjunctivae  normal.     Pupils: Pupils are equal, round, and reactive to light.  Neck:     Musculoskeletal: Neck supple.  Cardiovascular:     Rate and Rhythm: Normal rate and regular rhythm.  Pulmonary:     Effort: Pulmonary effort is normal.  Abdominal:     General: Bowel sounds are normal.     Palpations: Abdomen is soft.  Musculoskeletal: Normal range of motion.  Skin:    General: Skin is warm.  Neurological:     Mental Status: He is alert.  Psychiatric:        Behavior: Behavior normal.    Assessment and Plan   Aaron Drake was seen today for follow-up.  Diagnoses and all orders for this visit:  Type 2 diabetes mellitus without complication, without long-term current use of insulin (HCC) -     Comprehensive metabolic panel -     Hemoglobin A1c  Hyperlipidemia associated with type 2 diabetes mellitus ([place])  Hypertension associated with diabetes ([place])  Morbid obesity ([place])  Need for immunization against influenza -     Flu Vaccine QUAD 36+ mos IM  In general, he is doing very well. Labs today. Continue current treatment. The patient is asked to make an attempt to improve diet and exercise patterns to aid in medical management of this problem.   . Orders and follow up as documented in [place], reviewed diet, exercise and weight control, cardiovascular risk and specific lipid/LDL goals reviewed, reviewed medications and side effects in detail.  . Reviewed expectations re: course of current medical issues. . Outlined signs and symptoms indicating need for more acute intervention. . Patient  verbalized understanding and all questions were answered. . Patient received an After Visit Summary.  Briscoe Deutscher, DO [place], [place] 07/30/2019

## 2019-08-13 ENCOUNTER — Other Ambulatory Visit: Payer: Self-pay | Admitting: Allergy and Immunology

## 2019-09-04 ENCOUNTER — Other Ambulatory Visit: Payer: Self-pay | Admitting: Family Medicine

## 2019-09-04 DIAGNOSIS — E119 Type 2 diabetes mellitus without complications: Secondary | ICD-10-CM

## 2019-09-04 NOTE — Telephone Encounter (Signed)
Last fill 06/25/19  #12pen/1 Last OV 07/30/19  Next OV 10/29/19

## 2019-09-05 ENCOUNTER — Other Ambulatory Visit: Payer: Self-pay | Admitting: Allergy and Immunology

## 2019-09-13 ENCOUNTER — Other Ambulatory Visit: Payer: Self-pay | Admitting: Allergy and Immunology

## 2019-09-20 ENCOUNTER — Encounter: Payer: Self-pay | Admitting: Family Medicine

## 2019-10-13 ENCOUNTER — Other Ambulatory Visit: Payer: Self-pay | Admitting: Allergy and Immunology

## 2019-10-18 ENCOUNTER — Encounter: Payer: Self-pay | Admitting: *Deleted

## 2019-10-29 ENCOUNTER — Encounter: Payer: 59 | Admitting: Family Medicine

## 2019-10-30 ENCOUNTER — Encounter: Payer: Self-pay | Admitting: Family Medicine

## 2019-10-30 ENCOUNTER — Other Ambulatory Visit: Payer: Self-pay

## 2019-10-30 ENCOUNTER — Ambulatory Visit (INDEPENDENT_AMBULATORY_CARE_PROVIDER_SITE_OTHER): Payer: 59 | Admitting: Family Medicine

## 2019-10-30 DIAGNOSIS — K219 Gastro-esophageal reflux disease without esophagitis: Secondary | ICD-10-CM | POA: Diagnosis not present

## 2019-10-30 DIAGNOSIS — J4550 Severe persistent asthma, uncomplicated: Secondary | ICD-10-CM | POA: Insufficient documentation

## 2019-10-30 DIAGNOSIS — L2089 Other atopic dermatitis: Secondary | ICD-10-CM | POA: Diagnosis not present

## 2019-10-30 DIAGNOSIS — J3089 Other allergic rhinitis: Secondary | ICD-10-CM | POA: Diagnosis not present

## 2019-10-30 MED ORDER — OMEPRAZOLE 20 MG PO CPDR: 20.0000 mg | DELAYED_RELEASE_CAPSULE | Freq: Every day | ORAL | 1 refills | Status: DC
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
## 2019-10-30 NOTE — Patient Instructions (Addendum)
Asthma Continue montelukast 10 mg once a day to prevent cough or wheeze Continue Anoro Ellipta 1 puff once a day to prevent cough or wheeze Continue Qvar 80-2 puffs twice a day to prevent cough or wheeze Continue Ventolin (albuterol) 2 puffs every 4 hours as needed for shortness of breath, cough or wheeze Continue Dupixent 300 mg once every 14 days  Allergic rhinitis  Continue Xyzal 5 mg once a day as needed for a runny nose  Atopic dermatitis Continue a daily moisturizing routine  Reflux Continue omeprazole 20 mg once a day to decrease reflux Begin dietary and lifestyle modifications as listed below  Call the clinic if this treatment plan is not working well for you  Follow up in 6 months or sooner if needed.

## 2019-11-13 ENCOUNTER — Encounter: Payer: Self-pay | Admitting: Allergy and Immunology

## 2019-11-20 ENCOUNTER — Other Ambulatory Visit: Payer: Self-pay | Admitting: Family Medicine

## 2019-11-20 DIAGNOSIS — E119 Type 2 diabetes mellitus without complications: Secondary | ICD-10-CM

## 2019-11-30 ENCOUNTER — Other Ambulatory Visit: Payer: Self-pay | Admitting: Family Medicine

## 2019-11-30 DIAGNOSIS — E119 Type 2 diabetes mellitus without complications: Secondary | ICD-10-CM

## 2019-12-08 ENCOUNTER — Encounter: Payer: Self-pay | Admitting: Family Medicine

## 2019-12-10 ENCOUNTER — Encounter (INDEPENDENT_AMBULATORY_CARE_PROVIDER_SITE_OTHER): Payer: Self-pay | Admitting: Family Medicine

## 2019-12-13 ENCOUNTER — Other Ambulatory Visit: Payer: Self-pay | Admitting: *Deleted

## 2019-12-13 ENCOUNTER — Encounter: Payer: Self-pay | Admitting: Allergy and Immunology

## 2019-12-13 MED ORDER — MONTELUKAST SODIUM 10 MG PO TABS: 10.0000 mg | ORAL_TABLET | Freq: Every day | ORAL | 1 refills | Status: DC

## 2019-12-13 NOTE — Telephone Encounter (Signed)
Last OV 07/30/19 with Dr. Juleen China Last refill 06/18/19 #45/1 Next OV 12/17/19 with Dr. Jerline Pain

## 2019-12-17 ENCOUNTER — Encounter: Payer: Self-pay | Admitting: Family Medicine

## 2019-12-17 ENCOUNTER — Ambulatory Visit: Payer: 59 | Admitting: Family Medicine

## 2019-12-17 ENCOUNTER — Other Ambulatory Visit: Payer: Self-pay

## 2019-12-17 VITALS — BP 122/77 | HR 91 | Temp 98.0°F | Ht 65.0 in | Wt 215.2 lb

## 2019-12-17 DIAGNOSIS — J4550 Severe persistent asthma, uncomplicated: Secondary | ICD-10-CM | POA: Diagnosis not present

## 2019-12-17 DIAGNOSIS — R8290 Unspecified abnormal findings in urine: Secondary | ICD-10-CM | POA: Diagnosis not present

## 2019-12-17 DIAGNOSIS — E1159 Type 2 diabetes mellitus with other circulatory complications: Secondary | ICD-10-CM | POA: Diagnosis not present

## 2019-12-17 DIAGNOSIS — E119 Type 2 diabetes mellitus without complications: Secondary | ICD-10-CM

## 2019-12-17 DIAGNOSIS — I152 Hypertension secondary to endocrine disorders: Secondary | ICD-10-CM

## 2019-12-17 DIAGNOSIS — M791 Myalgia, unspecified site: Secondary | ICD-10-CM

## 2019-12-17 DIAGNOSIS — K219 Gastro-esophageal reflux disease without esophagitis: Secondary | ICD-10-CM

## 2019-12-17 DIAGNOSIS — T466X5A Adverse effect of antihyperlipidemic and antiarteriosclerotic drugs, initial encounter: Secondary | ICD-10-CM

## 2019-12-17 DIAGNOSIS — E785 Hyperlipidemia, unspecified: Secondary | ICD-10-CM

## 2019-12-17 DIAGNOSIS — I10 Essential (primary) hypertension: Secondary | ICD-10-CM

## 2019-12-17 DIAGNOSIS — J45909 Unspecified asthma, uncomplicated: Secondary | ICD-10-CM

## 2019-12-17 DIAGNOSIS — E1169 Type 2 diabetes mellitus with other specified complication: Secondary | ICD-10-CM

## 2019-12-17 DIAGNOSIS — N400 Enlarged prostate without lower urinary tract symptoms: Secondary | ICD-10-CM

## 2019-12-17 DIAGNOSIS — J3089 Other allergic rhinitis: Secondary | ICD-10-CM

## 2019-12-17 LAB — URINALYSIS, ROUTINE W REFLEX MICROSCOPIC
Bilirubin Urine: NEGATIVE
Hgb urine dipstick: NEGATIVE
Ketones, ur: NEGATIVE
Leukocytes,Ua: NEGATIVE
Nitrite: NEGATIVE
Specific Gravity, Urine: 1.025 (ref 1.000–1.030)
Total Protein, Urine: NEGATIVE
Urine Glucose: NEGATIVE
Urobilinogen, UA: 0.2 (ref 0.0–1.0)
pH: 6 (ref 5.0–8.0)

## 2019-12-17 LAB — POCT GLYCOSYLATED HEMOGLOBIN (HGB A1C): Hemoglobin A1C: 6.2 % — AB (ref 4.0–5.6)

## 2019-12-17 MED ORDER — DOXYCYCLINE HYCLATE 100 MG PO TABS: 100.0000 mg | ORAL_TABLET | Freq: Two times a day (BID) | ORAL | 0 refills | Status: DC

## 2019-12-17 MED ORDER — LOSARTAN POTASSIUM 50 MG PO TABS: ORAL_TABLET | ORAL | 1 refills | Status: DC

## 2019-12-17 NOTE — Assessment & Plan Note (Signed)
Controlled off medications.  Has been intolerant of statins due to myalgias.  Recheck in 6 to 12 months.

## 2019-12-17 NOTE — Patient Instructions (Signed)
It was very nice to see you today!  We will check a urine sample today.  I will refill your blood pressure medication.  I will send antibiotics in your pharmacy.  Come back in 6 months, or sooner if needed.  Take care, Dr Jerline Pain  Please try these tips to maintain a healthy lifestyle:   Eat at least 3 REAL meals and 1-2 snacks per day.  Aim for no more than 5 hours between eating.  If you eat breakfast, please do so within one hour of getting up.    Each meal should contain half fruits/vegetables, one quarter protein, and one quarter carbs (no bigger than a computer mouse)   Cut down on sweet beverages. This includes juice, soda, and sweet tea.     Drink at least 1 glass of water with each meal and aim for at least 8 glasses per day   Exercise at least 150 minutes every week.

## 2019-12-17 NOTE — Assessment & Plan Note (Signed)
Stable.  Continue management per allergy.  Continue Singulair 10 mg daily, Qvar daily, Anoro Ellipta daily, and Dupixent every 14 days.

## 2019-12-17 NOTE — Assessment & Plan Note (Signed)
Stable.  Continue management per allergy.  Continue Singulair and Dupixent.

## 2019-12-17 NOTE — Assessment & Plan Note (Signed)
Poorly controlled.  Continue management per urology.  Continue dutasteride and alfuzosin.

## 2019-12-17 NOTE — Telephone Encounter (Signed)
Please review

## 2019-12-17 NOTE — Progress Notes (Signed)
   Aaron Drake is a 64 y.o. male who presents today for an office visit.  He is transferring care.   Assessment/Plan:  New/Acute Problems: UTI Start empiric doxycycline as this has worked well for him in the past.  No systemic symptoms currently.  He had strep viridiansUTI in the past.  Will check UA and urine culture as well.  Chronic Problems Addressed Today: BPH (benign prostatic hyperplasia) Poorly controlled.  Continue management per urology.  Continue dutasteride and alfuzosin.  Asthma Stable.  Continue management per allergy.  Continue Singulair 10 mg daily, Qvar daily, Anoro Ellipta daily, and Dupixent every 14 days.   Gastroesophageal reflux disease Stable.  Continue Prilosec 20 mg daily.  Other allergic rhinitis Stable.  Continue management per allergy.  Continue Singulair and Dupixent.   Hypertension associated with diabetes ([place]) At goal.  Continue losartan 25 mg daily.  Type 2 diabetes mellitus without complication, without long-term current use of insulin (HCC) A1c 6.2.  Congratulated patient.  Continue Ozempic 1 mg weekly.  Check A1c in 6 months.  Hyperlipidemia associated with type 2 diabetes mellitus ([place]) Controlled off medications.  Has been intolerant of statins due to myalgias.  Recheck in 6 to 12 months.     Subjective:  HPI:  Patient thinks he may have a UTI.  Has had sensation in his lower back and foul-smelling urine similar to prior UTIs.  No fevers or chills.  # T2DM - On ozempic 1mg  weekly and tolerating well  # Essential Hypertension - On losartan 25mg  daily and tolerating well.   # Asthma / Allergic Rhinitis - Follows with allergy  - On Singulair 10 mg daily, Qvar daily, and Anoro Ellipta daily. - On dupixent every 14 days.  - Uses Atrovent and albuterol as needed  # BPH - Follows with urology - On dutasteride 0.5mg  daily and aflusozin.   # GERD  - On prilosec 20mg  daily  # Dyslipidemia - Diet Controlled.   # Osteoarthritis    - Has followed with sports medicine in the past.        Objective:  Physical Exam: BP 122/77   Pulse 91   Temp 98 F (36.7 C)   Ht 5\' 5"  (1.651 m)   Wt 215 lb 4 oz (97.6 kg)   SpO2 96%   BMI 35.82 kg/m   Wt Readings from Last 3 Encounters:  12/17/19 215 lb 4 oz (97.6 kg)  07/30/19 217 lb 6.4 oz (98.6 kg)  04/30/19 221 lb (100.2 kg)  Gen: No acute distress, resting comfortably CV: Regular rate and rhythm with no murmurs appreciated Pulm: Normal work of breathing, clear to auscultation bilaterally with no crackles, wheezes, or rhonchi Neuro: Grossly normal, moves all extremities Psych: Normal affect and thought content  Time Spent: 42 minutes of total time was spent on the date of the encounter performing the following actions: chart review prior to seeing the patient, obtaining history, performing a medically necessary exam, counseling on the treatment plan, placing orders, and documenting in our EHR.       Algis Greenhouse. Jerline Pain, MD 12/17/2019 10:49 AM

## 2019-12-17 NOTE — Assessment & Plan Note (Signed)
Stable.  Continue Prilosec 20 mg daily. 

## 2019-12-17 NOTE — Assessment & Plan Note (Signed)
A1c 6.2.  Congratulated patient.  Continue Ozempic 1 mg weekly.  Check A1c in 6 months.

## 2019-12-17 NOTE — Assessment & Plan Note (Signed)
At goal.  Continue losartan 25 mg daily. 

## 2019-12-18 LAB — URINE CULTURE
MICRO NUMBER:: 10052525
SPECIMEN QUALITY:: ADEQUATE

## 2019-12-19 ENCOUNTER — Other Ambulatory Visit: Payer: Self-pay | Admitting: Allergy and Immunology

## 2019-12-19 NOTE — Progress Notes (Signed)
Please inform patient of the following:  Urine culture inconclusive. Would like for him to finish his antibiotic and let us know if symptoms have not improved.  Algis Greenhouse. Jerline Pain, MD 12/19/2019 8:33 AM

## 2020-01-18 ENCOUNTER — Other Ambulatory Visit: Payer: Self-pay | Admitting: Family Medicine

## 2020-01-22 ENCOUNTER — Encounter: Payer: Self-pay | Admitting: Family Medicine

## 2020-01-22 ENCOUNTER — Encounter: Payer: Self-pay | Admitting: Allergy and Immunology

## 2020-01-23 ENCOUNTER — Other Ambulatory Visit: Payer: Self-pay

## 2020-01-23 MED ORDER — ANORO ELLIPTA 62.5-25 MCG/INH IN AEPB: 1.0000 | INHALATION_SPRAY | Freq: Every day | RESPIRATORY_TRACT | 0 refills | Status: DC

## 2020-01-24 ENCOUNTER — Other Ambulatory Visit: Payer: Self-pay

## 2020-01-24 MED ORDER — DUTASTERIDE 0.5 MG PO CAPS: 0.5000 mg | ORAL_CAPSULE | Freq: Every day | ORAL | 3 refills | Status: DC

## 2020-04-10 ENCOUNTER — Other Ambulatory Visit: Payer: Self-pay | Admitting: Family Medicine

## 2020-04-29 ENCOUNTER — Other Ambulatory Visit: Payer: Self-pay | Admitting: *Deleted

## 2020-04-29 ENCOUNTER — Encounter: Payer: Self-pay | Admitting: Allergy and Immunology

## 2020-04-29 ENCOUNTER — Ambulatory Visit: Payer: 59 | Admitting: Allergy and Immunology

## 2020-04-29 ENCOUNTER — Other Ambulatory Visit: Payer: Self-pay

## 2020-04-29 VITALS — BP 130/70 | HR 90 | Temp 98.0°F | Resp 17 | Ht 64.0 in | Wt 212.6 lb

## 2020-04-29 DIAGNOSIS — J3089 Other allergic rhinitis: Secondary | ICD-10-CM | POA: Diagnosis not present

## 2020-04-29 DIAGNOSIS — R338 Other retention of urine: Secondary | ICD-10-CM

## 2020-04-29 DIAGNOSIS — L2089 Other atopic dermatitis: Secondary | ICD-10-CM

## 2020-04-29 DIAGNOSIS — N401 Enlarged prostate with lower urinary tract symptoms: Secondary | ICD-10-CM

## 2020-04-29 DIAGNOSIS — K219 Gastro-esophageal reflux disease without esophagitis: Secondary | ICD-10-CM

## 2020-04-29 DIAGNOSIS — J4550 Severe persistent asthma, uncomplicated: Secondary | ICD-10-CM

## 2020-04-29 MED ORDER — PROAIR RESPICLICK 108 (90 BASE) MCG/ACT IN AEPB: 2.0000 | INHALATION_SPRAY | RESPIRATORY_TRACT | 1 refills | Status: DC | PRN

## 2020-04-29 NOTE — Progress Notes (Signed)
[place]   Follow-up Note  Referring Provider: Briscoe Deutscher, DO Primary Provider: Vivi Barrack, MD Date of Office Visit: 04/29/2020  Subjective:   Aaron Drake (DOB: 04/01/1956) is a 64 y.o. male who returns to the [facility] on 04/29/2020 in re-evaluation of the following:  HPI: [name] returns to this clinic in evaluation of COPD/asthma overlap, allergic rhinitis, history of atopic dermatitis, and history of reflux.  His last contact with me was via a E-med visit on 21 March 2019 and he did interact with our nurse practitioner via a E-med visit on 30 October 2019 at which point in time he was doing very well.  He believes that his asthma/COPD is doing well.  He uses a rescue inhaler twice a day which is his standard frequency of use.  It should be noted that he commonly uses Atrovent as his rescue inhaler and occasionally some albuterol.  It should be noted that he is having very significant prostate issues with inability to completely empty his bladder and this necessitated the discontinuation of his Anoro.  Since discontinuing his Anoro he has definitely done better with his prostate issue but still has a very slow flow and cannot empty his bladder completely.  He has not required a systemic steroid to treat any type of airway issue.  He continues on dupilumab on a consistent basis and continues to use Qvar at a dose of 1 inhalation twice a day.  His nose is really doing very well at this point in time.  He has stopped Xyzal secondary to his prostate issue as well but continues on montelukast.  His atopic dermatitis has completely resolved and he no longer needs to use a topical steroid.  He uses an over-the-counter moisturizer 1 time per week.  His reflux is under excellent control at this point in time while using his omeprazole.  He will not be receiving the Covid vaccine at this point.  Allergies as of 04/29/2020       Reactions   Lipitor [atorvastatin] Other (See Comments)   Muscle cramps and joint ache      Medication List      Albuterol Sulfate 108 (90 Base) MCG/ACT Aepb Commonly known as: ProAir RespiClick Inhale 2 puffs into the lungs every 4 (four) hours as needed.   albuterol 108 (90 Base) MCG/ACT inhaler Commonly known as: VENTOLIN HFA INHALE 2 PUFFS BY MOUTH EVERY 4 HOURS AS NEEDED FOR WHEEZE OR FOR SHORTNESS OF BREATH   alfuzosin 10 MG 24 hr tablet Commonly known as: UROXATRAL Take 10 mg by mouth daily with breakfast.   Anoro Ellipta 62.5-25 MCG/INH Aepb Generic drug: umeclidinium-vilanterol Inhale 1 puff into the lungs daily.   aspirin EC 81 MG tablet Take 1 tablet (81 mg total) by mouth daily.   CALCIUM + D3 PO Take 1 tablet by mouth daily.   CRANBERRY PO Take 2-3 capsules by mouth daily.   doxycycline 100 MG tablet Commonly known as: VIBRA-TABS Take 1 tablet (100 mg total) by mouth 2 (two) times daily.   Dupixent 300 MG/2ML prefilled syringe Generic drug: dupilumab INJECT 300 MG SUBCUTANEOUSLY EVERY OTHER WEEK   dutasteride 0.5 MG capsule Commonly known as: AVODART Take 1 capsule (0.5 mg total) by mouth daily.   EPINEPHrine 0.3 mg/0.3 mL Soaj injection Commonly known as: EpiPen 2-Pak Inject 0.3 mLs (0.3 mg total) into the muscle as needed for anaphylaxis.   ipratropium 17 MCG/ACT  inhaler Commonly known as: ATROVENT HFA Inhale 2 puffs into the lungs every 6 (six) hours as needed for wheezing.   levocetirizine 5 MG tablet Commonly known as: XYZAL Take 5 mg by mouth every evening.   losartan 50 MG tablet Commonly known as: COZAAR TAKE 1/2 TABLET (25MG       TOTAL) DAILY   montelukast 10 MG tablet Commonly known as: SINGULAIR Take 1 tablet (10 mg total) by mouth daily.   multivitamin with minerals Tabs tablet Take 1 tablet by mouth daily.   naproxen sodium 220 MG tablet Commonly known as: Aleve Take 1 tablet (220 mg total) by mouth every 8 (eight)  hours as needed (For pain.).   omeprazole 20 MG capsule Commonly known as: PRILOSEC TAKE 1 CAPSULE DAILY   Ozempic (1 MG/DOSE) 2 MG/1.5ML Sopn Generic drug: Semaglutide (1 MG/DOSE) INJECT 1 MG INTO THE SKIN ONCE A WEEK.   Qvar RediHaler 80 MCG/ACT inhaler Generic drug: beclomethasone USE 2 INHALATIONS ORALLY   TWICE DAILY TO PREVENT     COUGH OR WHEEZE; RINSE,    GARGLE AND SPIT AFTER USE       Past Medical History:  Diagnosis Date  . Allergy   . Arthritis   . Asthma 05/12/2012  . BPH (benign prostatic hyperplasia) 05/19/2012  . Diabetes mellitus without complication ([place])   . Dyspnea    physical stress; exercise   . History of kidney stones   . Hyperlipidemia   . Impaired glucose tolerance 05/19/2012  . Tear of meniscus of right knee 2018   partial tear    Past Surgical History:  Procedure Laterality Date  . CATARACT EXTRACTION Right   . EXTRACORPOREAL SHOCK WAVE LITHOTRIPSY Left 05/08/2018   Procedure: LEFT EXTRACORPOREAL SHOCK WAVE LITHOTRIPSY (ESWL);  Surgeon: [name], MD;  Location: [name];  Service: Urology;  Laterality: Left;  . INSERTION OF MESH N/A 05/17/2016   Procedure: INSERTION OF MESH;  Surgeon: Rolm Bookbinder, MD;  Location: [facility];  Service: General;  Laterality: N/A;  . UMBILICAL HERNIA REPAIR N/A 05/17/2016   Procedure: UMBILICAL HERNIA REPAIR;  Surgeon: Rolm Bookbinder, MD;  Location: [facility];  Service: General;  Laterality: N/A;  . WISDOM TOOTH EXTRACTION      Review of systems negative except as noted in HPI / PMHx or noted below:  Review of Systems  Constitutional: Negative.   HENT: Negative.   Eyes: Negative.   Respiratory: Negative.   Cardiovascular: Negative.   Gastrointestinal: Negative.   Genitourinary: Negative.   Musculoskeletal: Negative.   Skin: Negative.   Neurological: Negative.   Endo/Heme/Allergies: Negative.   Psychiatric/Behavioral: Negative.      Objective:   Vitals:   04/29/20 1011  BP: 130/70  Pulse: 90    Resp: 17  Temp: 98 F (36.7 C)  SpO2: 96%   Height: 5\' 4"  (162.6 cm)  Weight: 212 lb 9.6 oz (96.4 kg)   Physical Exam Constitutional:      Appearance: He is not diaphoretic.  HENT:     Head: Normocephalic.     Right Ear: Tympanic membrane, ear canal and external ear normal.     Left Ear: Tympanic membrane, ear canal and external ear normal.     Nose: Nose normal. No mucosal edema or rhinorrhea.     Mouth/Throat:     Pharynx: Uvula midline. No oropharyngeal exudate.  Eyes:     Conjunctiva/sclera: Conjunctivae normal.  Neck:     Thyroid: No thyromegaly.     Trachea: Trachea normal. No  tracheal tenderness or tracheal deviation.  Cardiovascular:     Rate and Rhythm: Normal rate and regular rhythm.     Heart sounds: Normal heart sounds, S1 normal and S2 normal. No murmur.  Pulmonary:     Effort: No respiratory distress.     Breath sounds: Normal breath sounds. No stridor. No wheezing or rales.  Lymphadenopathy:     Head:     Right side of head: No tonsillar adenopathy.     Left side of head: No tonsillar adenopathy.     Cervical: No cervical adenopathy.  Skin:    Findings: No erythema or rash.     Nails: There is no clubbing.  Neurological:     Mental Status: He is alert.     Diagnostics:    Spirometry was performed and demonstrated an FEV1 of 1.84 at 65 % of predicted.  Assessment and Plan:   1. Asthma, severe persistent, well-controlled   2. Other allergic rhinitis   3. Other atopic dermatitis   4. Gastroesophageal reflux disease, unspecified whether esophagitis present   5. Benign prostatic hyperplasia with urinary retention     1. Continue a combination of:   A. Qvar 80 Redihaler - 1-2 inhalations twice a day.   B. montelukast 10 mg daily  C. Dupilumab injections every 2 weeks  D. Omeprazole 20 mg 1 time per day  2. Continue Claritin and ProAir REDIHALER and topical treatment if needed.  3. Do not use inhaled ipratropium (Atrovent)  3.  Increase Qvar  to 3 inhalations 3 times per day as part of "action plan" for flareup  4. Return to clinic in 6 months or earlier if problem  [name] appears to be doing very good from a respiratory standpoint and a skin standpoint but his prostate issue is very active and I think he needs to eliminate the use of all anticholinergic agents including his as needed ipratropium.  He will maintain anti-inflammatory therapy directed against respiratory tract inflammation with the use of Qvar and dupilumab and montelukast and also continue to use his dupilumab for his skin condition.  He will remain on therapy for reflux.  I will see him back in this clinic in 6 months or earlier if there is a problem.  Allena Katz, MD Allergy / Immunology [facility]

## 2020-04-29 NOTE — Patient Instructions (Addendum)
  1. Continue a combination of:   A. Qvar 80 Redihaler - 1-2 inhalations twice a day.   B. montelukast 10 mg daily  C. Dupilumab injections every 2 weeks  D. Omeprazole 20 mg 1 time per day  2. Continue Claritin and ProAir REDIHALER and topical treatment if needed.  3. Do not use inhaled ipratropium (Atrovent)  3.  Increase Qvar to 3 inhalations 3 times per day as part of "action plan" for flareup  4. Return to clinic in 6 months or earlier if problem

## 2020-04-30 ENCOUNTER — Encounter: Payer: Self-pay | Admitting: Allergy and Immunology

## 2020-05-08 NOTE — Telephone Encounter (Signed)
PA for Proair Respiclick was initiated through [email or web address] awaiting approval

## 2020-05-21 ENCOUNTER — Other Ambulatory Visit: Payer: Self-pay

## 2020-05-21 MED ORDER — ALBUTEROL SULFATE HFA 108 (90 BASE) MCG/ACT IN AERS
2.0000 | INHALATION_SPRAY | RESPIRATORY_TRACT | 1 refills | Status: DC | PRN
Start: 2020-05-21 — End: 2020-06-25

## 2020-05-28 ENCOUNTER — Other Ambulatory Visit: Payer: Self-pay | Admitting: Family Medicine

## 2020-05-28 DIAGNOSIS — E119 Type 2 diabetes mellitus without complications: Secondary | ICD-10-CM

## 2020-06-16 ENCOUNTER — Ambulatory Visit (INDEPENDENT_AMBULATORY_CARE_PROVIDER_SITE_OTHER): Payer: 59 | Admitting: Family Medicine

## 2020-06-16 ENCOUNTER — Encounter: Payer: Self-pay | Admitting: Family Medicine

## 2020-06-16 ENCOUNTER — Other Ambulatory Visit: Payer: Self-pay

## 2020-06-16 VITALS — BP 123/72 | HR 80 | Temp 97.0°F | Ht 64.0 in | Wt 213.2 lb

## 2020-06-16 DIAGNOSIS — Z125 Encounter for screening for malignant neoplasm of prostate: Secondary | ICD-10-CM

## 2020-06-16 DIAGNOSIS — E1159 Type 2 diabetes mellitus with other circulatory complications: Secondary | ICD-10-CM | POA: Diagnosis not present

## 2020-06-16 DIAGNOSIS — N401 Enlarged prostate with lower urinary tract symptoms: Secondary | ICD-10-CM | POA: Diagnosis not present

## 2020-06-16 DIAGNOSIS — R338 Other retention of urine: Secondary | ICD-10-CM

## 2020-06-16 DIAGNOSIS — I152 Hypertension secondary to endocrine disorders: Secondary | ICD-10-CM

## 2020-06-16 DIAGNOSIS — E119 Type 2 diabetes mellitus without complications: Secondary | ICD-10-CM | POA: Diagnosis not present

## 2020-06-16 DIAGNOSIS — I10 Essential (primary) hypertension: Secondary | ICD-10-CM

## 2020-06-16 LAB — POCT GLYCOSYLATED HEMOGLOBIN (HGB A1C): Hemoglobin A1C: 6 % — AB (ref 4.0–5.6)

## 2020-06-16 NOTE — Assessment & Plan Note (Signed)
At goal.  Continue losartan 25 mg daily. 

## 2020-06-16 NOTE — Patient Instructions (Signed)
It was very nice to see you today!  I am very happy with your blood sugar.  No medication changes today.  Please come back in 3 months or so for your annual physical with blood work.  Come back to see me sooner if needed.  Take care, Dr Jerline Pain  Please try these tips to maintain a healthy lifestyle:   Eat at least 3 REAL meals and 1-2 snacks per day.  Aim for no more than 5 hours between eating.  If you eat breakfast, please do so within one hour of getting up.    Each meal should contain half fruits/vegetables, one quarter protein, and one quarter carbs (no bigger than a computer mouse)   Cut down on sweet beverages. This includes juice, soda, and sweet tea.     Drink at least 1 glass of water with each meal and aim for at least 8 glasses per day   Exercise at least 150 minutes every week.

## 2020-06-16 NOTE — Assessment & Plan Note (Signed)
A1c improved to 6.0.  Continue Ozempic 1 mg weekly.  He will come back in 3 months for CPE with blood work.  Recheck A1c at that time.

## 2020-06-16 NOTE — Progress Notes (Signed)
   Aaron Drake is a 64 y.o. male who presents today for an office visit.  Assessment/Plan:  Chronic Problems Addressed Today: BPH (benign prostatic hyperplasia) Patient recently started saw palmetto.  He is still the medications per urology including dutasteride and alfuzosin.  Symptoms are slowly improving.  Will consider trial of Cialis if continues to have issues.  Hypertension associated with diabetes ([place]) At goal.  Continue losartan 25 mg daily.  Type 2 diabetes mellitus without complication, without long-term current use of insulin ([facility]) A1c improved to 6.0.  Continue Ozempic 1 mg weekly.  He will come back in 3 months for CPE with blood work.  Recheck A1c at that time.    Subjective:  HPI:  See A/p.        Objective:  Physical Exam: BP 123/72   Pulse 80   Temp (!) 97 F (36.1 C)   Ht 5\' 4"  (1.626 m)   Wt 213 lb 4 oz (96.7 kg)   SpO2 96%   BMI 36.60 kg/m   Wt Readings from Last 3 Encounters:  06/16/20 213 lb 4 oz (96.7 kg)  04/29/20 212 lb 9.6 oz (96.4 kg)  12/17/19 215 lb 4 oz (97.6 kg)  Gen: No acute distress, resting comfortably CV: Regular rate and rhythm with no murmurs appreciated Pulm: Normal work of breathing, clear to auscultation bilaterally with no crackles, wheezes, or rhonchi Neuro: Grossly normal, moves all extremities Psych: Normal affect and thought content      Shamell Suarez M. Jerline Pain, MD 06/16/2020 10:29 AM

## 2020-06-16 NOTE — Assessment & Plan Note (Addendum)
Patient recently started saw palmetto.  He is still the medications per urology including dutasteride and alfuzosin.  Symptoms are slowly improving.  Will consider trial of Cialis if continues to have issues.

## 2020-06-25 ENCOUNTER — Encounter: Payer: Self-pay | Admitting: Physician Assistant

## 2020-06-25 ENCOUNTER — Other Ambulatory Visit: Payer: Self-pay

## 2020-06-25 ENCOUNTER — Telehealth (INDEPENDENT_AMBULATORY_CARE_PROVIDER_SITE_OTHER): Payer: 59 | Admitting: Physician Assistant

## 2020-06-25 VITALS — Ht 64.0 in | Wt 213.0 lb

## 2020-06-25 DIAGNOSIS — R399 Unspecified symptoms and signs involving the genitourinary system: Secondary | ICD-10-CM

## 2020-06-25 DIAGNOSIS — J4541 Moderate persistent asthma with (acute) exacerbation: Secondary | ICD-10-CM

## 2020-06-25 LAB — POCT URINALYSIS DIPSTICK
Bilirubin, UA: NEGATIVE
Blood, UA: NEGATIVE
Glucose, UA: NEGATIVE
Ketones, UA: POSITIVE
Nitrite, UA: NEGATIVE
Protein, UA: POSITIVE — AB
Spec Grav, UA: 1.025 (ref 1.010–1.025)
Urobilinogen, UA: 1 E.U./dL
pH, UA: 6 (ref 5.0–8.0)

## 2020-06-25 MED ORDER — PREDNISONE 20 MG PO TABS: 40.0000 mg | ORAL_TABLET | Freq: Every day | ORAL | 0 refills | Status: DC

## 2020-06-25 MED ORDER — AMOXICILLIN-POT CLAVULANATE 875-125 MG PO TABS
1.0000 | ORAL_TABLET | Freq: Two times a day (BID) | ORAL | 0 refills | Status: DC
Start: 2020-06-25 — End: 2020-07-04

## 2020-06-25 MED ORDER — ALBUTEROL SULFATE HFA 108 (90 BASE) MCG/ACT IN AERS: 2.0000 | INHALATION_SPRAY | RESPIRATORY_TRACT | 2 refills | Status: DC | PRN
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
## 2020-06-27 LAB — URINE CULTURE
MICRO NUMBER:: 10759986
SPECIMEN QUALITY:: ADEQUATE

## 2020-06-28 ENCOUNTER — Other Ambulatory Visit: Payer: Self-pay | Admitting: Physician Assistant

## 2020-06-28 ENCOUNTER — Encounter: Payer: Self-pay | Admitting: Physician Assistant

## 2020-06-28 MED ORDER — NITROFURANTOIN MONOHYD MACRO 100 MG PO CAPS
100.0000 mg | ORAL_CAPSULE | Freq: Two times a day (BID) | ORAL | 0 refills | Status: DC
Start: 2020-06-28 — End: 2020-07-10

## 2020-07-01 ENCOUNTER — Inpatient Hospital Stay (HOSPITAL_COMMUNITY)
Admission: EM | Admit: 2020-07-01 | Discharge: 2020-07-04 | DRG: 177 | Disposition: A | Discharge status: Home / Self Care | Payer: 59 | Attending: Family Medicine | Admitting: Family Medicine

## 2020-07-01 ENCOUNTER — Encounter (HOSPITAL_COMMUNITY): Payer: Self-pay

## 2020-07-01 ENCOUNTER — Other Ambulatory Visit: Payer: Self-pay

## 2020-07-01 ENCOUNTER — Emergency Department (HOSPITAL_COMMUNITY): Payer: 59

## 2020-07-01 DIAGNOSIS — R7989 Other specified abnormal findings of blood chemistry: Secondary | ICD-10-CM | POA: Diagnosis present

## 2020-07-01 DIAGNOSIS — Z79890 Hormone replacement therapy (postmenopausal): Secondary | ICD-10-CM | POA: Diagnosis not present

## 2020-07-01 DIAGNOSIS — Z8249 Family history of ischemic heart disease and other diseases of the circulatory system: Secondary | ICD-10-CM

## 2020-07-01 DIAGNOSIS — Z6836 Body mass index (BMI) 36.0-36.9, adult: Secondary | ICD-10-CM | POA: Diagnosis not present

## 2020-07-01 DIAGNOSIS — N400 Enlarged prostate without lower urinary tract symptoms: Secondary | ICD-10-CM | POA: Diagnosis present

## 2020-07-01 DIAGNOSIS — Z7982 Long term (current) use of aspirin: Secondary | ICD-10-CM

## 2020-07-01 DIAGNOSIS — B952 Enterococcus as the cause of diseases classified elsewhere: Secondary | ICD-10-CM | POA: Diagnosis present

## 2020-07-01 DIAGNOSIS — Z825 Family history of asthma and other chronic lower respiratory diseases: Secondary | ICD-10-CM | POA: Diagnosis not present

## 2020-07-01 DIAGNOSIS — K219 Gastro-esophageal reflux disease without esophagitis: Secondary | ICD-10-CM | POA: Diagnosis present

## 2020-07-01 DIAGNOSIS — Z823 Family history of stroke: Secondary | ICD-10-CM | POA: Diagnosis not present

## 2020-07-01 DIAGNOSIS — J1282 Pneumonia due to coronavirus disease 2019: Secondary | ICD-10-CM

## 2020-07-01 DIAGNOSIS — N390 Urinary tract infection, site not specified: Secondary | ICD-10-CM | POA: Diagnosis present

## 2020-07-01 DIAGNOSIS — I10 Essential (primary) hypertension: Secondary | ICD-10-CM | POA: Diagnosis present

## 2020-07-01 DIAGNOSIS — I152 Hypertension secondary to endocrine disorders: Secondary | ICD-10-CM | POA: Diagnosis present

## 2020-07-01 DIAGNOSIS — J9601 Acute respiratory failure with hypoxia: Secondary | ICD-10-CM | POA: Diagnosis not present

## 2020-07-01 DIAGNOSIS — R338 Other retention of urine: Secondary | ICD-10-CM | POA: Diagnosis not present

## 2020-07-01 DIAGNOSIS — E1169 Type 2 diabetes mellitus with other specified complication: Secondary | ICD-10-CM | POA: Diagnosis present

## 2020-07-01 DIAGNOSIS — Z87891 Personal history of nicotine dependence: Secondary | ICD-10-CM

## 2020-07-01 DIAGNOSIS — Z9841 Cataract extraction status, right eye: Secondary | ICD-10-CM

## 2020-07-01 DIAGNOSIS — U071 COVID-19: Principal | ICD-10-CM

## 2020-07-01 DIAGNOSIS — N401 Enlarged prostate with lower urinary tract symptoms: Secondary | ICD-10-CM | POA: Diagnosis not present

## 2020-07-01 DIAGNOSIS — Z7951 Long term (current) use of inhaled steroids: Secondary | ICD-10-CM

## 2020-07-01 DIAGNOSIS — E785 Hyperlipidemia, unspecified: Secondary | ICD-10-CM | POA: Diagnosis present

## 2020-07-01 DIAGNOSIS — J45909 Unspecified asthma, uncomplicated: Secondary | ICD-10-CM | POA: Diagnosis present

## 2020-07-01 DIAGNOSIS — Z888 Allergy status to other drugs, medicaments and biological substances status: Secondary | ICD-10-CM

## 2020-07-01 DIAGNOSIS — Z801 Family history of malignant neoplasm of trachea, bronchus and lung: Secondary | ICD-10-CM | POA: Diagnosis not present

## 2020-07-01 DIAGNOSIS — E1159 Type 2 diabetes mellitus with other circulatory complications: Secondary | ICD-10-CM | POA: Diagnosis present

## 2020-07-01 DIAGNOSIS — Z87442 Personal history of urinary calculi: Secondary | ICD-10-CM

## 2020-07-01 DIAGNOSIS — E119 Type 2 diabetes mellitus without complications: Secondary | ICD-10-CM

## 2020-07-01 DIAGNOSIS — R7401 Elevation of levels of liver transaminase levels: Secondary | ICD-10-CM | POA: Diagnosis present

## 2020-07-01 DIAGNOSIS — Z800 Family history of malignant neoplasm of digestive organs: Secondary | ICD-10-CM

## 2020-07-01 DIAGNOSIS — M1711 Unilateral primary osteoarthritis, right knee: Secondary | ICD-10-CM | POA: Diagnosis present

## 2020-07-01 DIAGNOSIS — Z79899 Other long term (current) drug therapy: Secondary | ICD-10-CM

## 2020-07-01 LAB — CBC WITH DIFFERENTIAL/PLATELET
Abs Immature Granulocytes: 0.11 10*3/uL — ABNORMAL HIGH (ref 0.00–0.07)
Basophils Absolute: 0 10*3/uL (ref 0.0–0.1)
Basophils Relative: 0 %
Eosinophils Absolute: 0 10*3/uL (ref 0.0–0.5)
Eosinophils Relative: 0 %
HCT: 43.2 % (ref 39.0–52.0)
Hemoglobin: 14.2 g/dL (ref 13.0–17.0)
Immature Granulocytes: 1 %
Lymphocytes Relative: 14 %
Lymphs Abs: 1.4 10*3/uL (ref 0.7–4.0)
MCH: 28.3 pg (ref 26.0–34.0)
MCHC: 32.9 g/dL (ref 30.0–36.0)
MCV: 86.1 fL (ref 80.0–100.0)
Monocytes Absolute: 1 10*3/uL (ref 0.1–1.0)
Monocytes Relative: 10 %
Neutro Abs: 7.2 10*3/uL (ref 1.7–7.7)
Neutrophils Relative %: 75 %
Platelets: 309 10*3/uL (ref 150–400)
RBC: 5.02 MIL/uL (ref 4.22–5.81)
RDW: 13.6 % (ref 11.5–15.5)
WBC: 9.7 10*3/uL (ref 4.0–10.5)
nRBC: 0 % (ref 0.0–0.2)

## 2020-07-01 LAB — HIV ANTIBODY (ROUTINE TESTING W REFLEX): HIV Screen 4th Generation wRfx: NONREACTIVE

## 2020-07-01 LAB — URINALYSIS, ROUTINE W REFLEX MICROSCOPIC
Bacteria, UA: NONE SEEN
Bilirubin Urine: NEGATIVE
Glucose, UA: NEGATIVE mg/dL
Hgb urine dipstick: NEGATIVE
Ketones, ur: 20 mg/dL — AB
Leukocytes,Ua: NEGATIVE
Nitrite: NEGATIVE
Protein, ur: 30 mg/dL — AB
Specific Gravity, Urine: 1.031 — ABNORMAL HIGH (ref 1.005–1.030)
pH: 6 (ref 5.0–8.0)

## 2020-07-01 LAB — COMPREHENSIVE METABOLIC PANEL
ALT: 100 U/L — ABNORMAL HIGH (ref 0–44)
AST: 39 U/L (ref 15–41)
Albumin: 3.7 g/dL (ref 3.5–5.0)
Alkaline Phosphatase: 54 U/L (ref 38–126)
Anion gap: 10 (ref 5–15)
BUN: 14 mg/dL (ref 8–23)
CO2: 22 mmol/L (ref 22–32)
Calcium: 8.6 mg/dL — ABNORMAL LOW (ref 8.9–10.3)
Chloride: 104 mmol/L (ref 98–111)
Creatinine, Ser: 0.64 mg/dL (ref 0.61–1.24)
GFR calc Af Amer: >60 mL/min (ref >60)
GFR calc non Af Amer: >60 mL/min (ref >60)
Glucose, Bld: 139 mg/dL — ABNORMAL HIGH (ref 70–99)
Potassium: 3.7 mmol/L (ref 3.5–5.1)
Sodium: 136 mmol/L (ref 135–145)
Total Bilirubin: 0.9 mg/dL (ref 0.3–1.2)
Total Protein: 7.4 g/dL (ref 6.5–8.1)

## 2020-07-01 LAB — C-REACTIVE PROTEIN: CRP: 5.4 mg/dL — ABNORMAL HIGH (ref <1.0)

## 2020-07-01 LAB — D-DIMER, QUANTITATIVE: D-Dimer, Quant: 1.04 ug/mL-FEU — ABNORMAL HIGH (ref 0.00–0.50)

## 2020-07-01 LAB — FIBRINOGEN: Fibrinogen: 540 mg/dL — ABNORMAL HIGH (ref 210–475)

## 2020-07-01 LAB — LACTIC ACID, PLASMA
Lactic Acid, Venous: 1 mmol/L (ref 0.5–1.9)
Lactic Acid, Venous: 0.9 mmol/L (ref 0.5–1.9)

## 2020-07-01 LAB — SARS CORONAVIRUS 2 BY RT PCR (HOSPITAL ORDER, PERFORMED IN ~~LOC~~ HOSPITAL LAB): SARS Coronavirus 2: POSITIVE — AB

## 2020-07-01 LAB — TRIGLYCERIDES: Triglycerides: 79 mg/dL (ref <150)

## 2020-07-01 LAB — PROCALCITONIN: Procalcitonin: <0.1 ng/mL

## 2020-07-01 LAB — FERRITIN: Ferritin: 232 ng/mL (ref 24–336)

## 2020-07-01 LAB — GLUCOSE, CAPILLARY
Glucose-Capillary: 197 mg/dL — ABNORMAL HIGH (ref 70–99)
Glucose-Capillary: 153 mg/dL — ABNORMAL HIGH (ref 70–99)

## 2020-07-01 LAB — LACTATE DEHYDROGENASE: LDH: 251 U/L — ABNORMAL HIGH (ref 98–192)

## 2020-07-01 LAB — CBG MONITORING, ED: Glucose-Capillary: 123 mg/dL — ABNORMAL HIGH (ref 70–99)

## 2020-07-01 MED ORDER — ACETAMINOPHEN 325 MG PO TABS: 650.0000 mg | ORAL_TABLET | Freq: Four times a day (QID) | ORAL | Status: DC | PRN

## 2020-07-01 MED ORDER — ASPIRIN EC 81 MG PO TBEC
81.0000 mg | DELAYED_RELEASE_TABLET | Freq: Every day | ORAL | Status: DC
  Administered 2020-07-01 – 2020-07-04 (×4): 81 mg via ORAL
  Filled 2020-07-01 (×4): qty 1

## 2020-07-01 MED ORDER — SODIUM CHLORIDE 0.9 % IV SOLN
500.0000 mg | Freq: Once | INTRAVENOUS | Status: AC
  Administered 2020-07-01: 500 mg via INTRAVENOUS
  Filled 2020-07-01: qty 500

## 2020-07-01 MED ORDER — ALBUTEROL SULFATE HFA 108 (90 BASE) MCG/ACT IN AERS
2.0000 | INHALATION_SPRAY | Freq: Four times a day (QID) | RESPIRATORY_TRACT | Status: DC
  Administered 2020-07-01 – 2020-07-04 (×12): 2 via RESPIRATORY_TRACT
  Filled 2020-07-01 (×3): qty 6.7

## 2020-07-01 MED ORDER — DEXAMETHASONE SODIUM PHOSPHATE 10 MG/ML IJ SOLN
6.0000 mg | Freq: Once | INTRAMUSCULAR | Status: AC
  Administered 2020-07-01: 6 mg via INTRAVENOUS
  Filled 2020-07-01: qty 1

## 2020-07-01 MED ORDER — SODIUM CHLORIDE 0.9 % IV SOLN
100.0000 mg | Freq: Every day | INTRAVENOUS | Status: DC
  Administered 2020-07-02 – 2020-07-04 (×3): 100 mg via INTRAVENOUS
  Filled 2020-07-01 (×3): qty 20

## 2020-07-01 MED ORDER — ALFUZOSIN HCL ER 10 MG PO TB24
10.0000 mg | ORAL_TABLET | Freq: Every day | ORAL | Status: DC
  Administered 2020-07-02 – 2020-07-04 (×3): 10 mg via ORAL
  Filled 2020-07-01 (×4): qty 1

## 2020-07-01 MED ORDER — SODIUM CHLORIDE 0.9 % IV SOLN
1.0000 g | Freq: Once | INTRAVENOUS | Status: AC
  Administered 2020-07-01: 1 g via INTRAVENOUS
  Filled 2020-07-01: qty 10

## 2020-07-01 MED ORDER — ZINC SULFATE 220 (50 ZN) MG PO CAPS
220.0000 mg | ORAL_CAPSULE | Freq: Every day | ORAL | Status: DC
  Administered 2020-07-01 – 2020-07-04 (×4): 220 mg via ORAL
  Filled 2020-07-01 (×4): qty 1

## 2020-07-01 MED ORDER — ENOXAPARIN SODIUM 60 MG/0.6ML ~~LOC~~ SOLN
50.0000 mg | SUBCUTANEOUS | Status: DC
  Filled 2020-07-01: qty 0.5

## 2020-07-01 MED ORDER — MONTELUKAST SODIUM 10 MG PO TABS
10.0000 mg | ORAL_TABLET | Freq: Every day | ORAL | Status: DC
  Administered 2020-07-01 – 2020-07-03 (×3): 10 mg via ORAL
  Filled 2020-07-01 (×3): qty 1

## 2020-07-01 MED ORDER — BECLOMETHASONE DIPROP HFA 80 MCG/ACT IN AERB
2.0000 | INHALATION_SPRAY | Freq: Two times a day (BID) | RESPIRATORY_TRACT | Status: DC
  Administered 2020-07-01 – 2020-07-04 (×6): 2 via RESPIRATORY_TRACT
  Filled 2020-07-01: qty 10.6

## 2020-07-01 MED ORDER — CALCIUM CARB-CHOLECALCIFEROL 600-800 MG-UNIT PO TABS: ORAL_TABLET | Freq: Every day | ORAL | Status: DC

## 2020-07-01 MED ORDER — CALCIUM CARBONATE-VITAMIN D 500-200 MG-UNIT PO TABS
2.0000 | ORAL_TABLET | Freq: Every day | ORAL | Status: DC
  Administered 2020-07-02 – 2020-07-04 (×3): 2 via ORAL
  Filled 2020-07-01 (×3): qty 2

## 2020-07-01 MED ORDER — LINAGLIPTIN 5 MG PO TABS
5.0000 mg | ORAL_TABLET | Freq: Every day | ORAL | Status: DC
  Administered 2020-07-01 – 2020-07-04 (×4): 5 mg via ORAL
  Filled 2020-07-01 (×4): qty 1

## 2020-07-01 MED ORDER — PANTOPRAZOLE SODIUM 40 MG PO TBEC
40.0000 mg | DELAYED_RELEASE_TABLET | Freq: Every day | ORAL | Status: DC
  Administered 2020-07-01 – 2020-07-04 (×4): 40 mg via ORAL
  Filled 2020-07-01 (×3): qty 1

## 2020-07-01 MED ORDER — ASCORBIC ACID 500 MG PO TABS
500.0000 mg | ORAL_TABLET | Freq: Every day | ORAL | Status: DC
  Administered 2020-07-01 – 2020-07-04 (×4): 500 mg via ORAL
  Filled 2020-07-01 (×4): qty 1

## 2020-07-01 MED ORDER — SODIUM CHLORIDE 0.9 % IV SOLN
100.0000 mg | INTRAVENOUS | Status: AC
  Administered 2020-07-01 (×2): 100 mg via INTRAVENOUS
  Filled 2020-07-01: qty 20

## 2020-07-01 MED ORDER — INSULIN ASPART 100 UNIT/ML ~~LOC~~ SOLN
0.0000 [IU] | Freq: Three times a day (TID) | SUBCUTANEOUS | Status: DC
  Administered 2020-07-01: 2 [IU] via SUBCUTANEOUS
  Administered 2020-07-01: 3 [IU] via SUBCUTANEOUS
  Administered 2020-07-02: 5 [IU] via SUBCUTANEOUS
  Administered 2020-07-02: 2 [IU] via SUBCUTANEOUS
  Administered 2020-07-03: 5 [IU] via SUBCUTANEOUS
  Filled 2020-07-01: qty 0.15

## 2020-07-01 MED ORDER — ALBUTEROL SULFATE HFA 108 (90 BASE) MCG/ACT IN AERS: 2.0000 | INHALATION_SPRAY | RESPIRATORY_TRACT | Status: DC | PRN

## 2020-07-01 MED ORDER — NITROFURANTOIN MONOHYD MACRO 100 MG PO CAPS
100.0000 mg | ORAL_CAPSULE | Freq: Two times a day (BID) | ORAL | Status: DC
  Administered 2020-07-01 – 2020-07-04 (×6): 100 mg via ORAL
  Filled 2020-07-01 (×6): qty 1

## 2020-07-01 MED ORDER — DEXAMETHASONE SODIUM PHOSPHATE 10 MG/ML IJ SOLN
6.0000 mg | INTRAMUSCULAR | Status: DC
  Administered 2020-07-02 – 2020-07-04 (×3): 6 mg via INTRAVENOUS
  Filled 2020-07-01 (×3): qty 1

## 2020-07-01 MED ORDER — SENNOSIDES-DOCUSATE SODIUM 8.6-50 MG PO TABS: 1.0000 | ORAL_TABLET | Freq: Every evening | ORAL | Status: DC | PRN

## 2020-07-01 MED ORDER — GUAIFENESIN-DM 100-10 MG/5ML PO SYRP: 10.0000 mL | ORAL_SOLUTION | ORAL | Status: DC | PRN

## 2020-07-01 MED ORDER — ADULT MULTIVITAMIN W/MINERALS CH
1.0000 | ORAL_TABLET | Freq: Every day | ORAL | Status: DC
  Administered 2020-07-01 – 2020-07-04 (×4): 1 via ORAL
  Filled 2020-07-01 (×4): qty 1

## 2020-07-01 MED ORDER — ONDANSETRON HCL 4 MG/2ML IJ SOLN: 4.0000 mg | Freq: Four times a day (QID) | INTRAMUSCULAR | Status: DC | PRN

## 2020-07-01 MED ORDER — HYDROCOD POLST-CPM POLST ER 10-8 MG/5ML PO SUER
5.0000 mL | Freq: Two times a day (BID) | ORAL | Status: DC | PRN
  Administered 2020-07-01: 5 mL via ORAL
  Filled 2020-07-01: qty 5

## 2020-07-01 MED ORDER — ONDANSETRON HCL 4 MG PO TABS: 4.0000 mg | ORAL_TABLET | Freq: Four times a day (QID) | ORAL | Status: DC | PRN

## 2020-07-01 MED ORDER — DUTASTERIDE 0.5 MG PO CAPS
0.5000 mg | ORAL_CAPSULE | Freq: Every day | ORAL | Status: DC
  Administered 2020-07-01 – 2020-07-04 (×4): 0.5 mg via ORAL
  Filled 2020-07-01 (×4): qty 1

## 2020-07-01 NOTE — ED Triage Notes (Signed)
Patient reports that he was diagnosed with bronchitis 2 weeks ago and has been taking Amoxicillin. SOB is worse. Patient states he was also diagnosed with a UTI yesterday, but does not know the antibiotic he was placed on.

## 2020-07-01 NOTE — ED Notes (Signed)
PT ambulated self with minor assistance to bedside commode, PT expressed he increasingly became short of breath with any exertion, PT's SpO2 maintained between 92% to 87% on nasal cannula, PT provided urine specimen and returned to bed.

## 2020-07-01 NOTE — ED Notes (Signed)
Patient oxygen sats dropped to 88-90% on room air after a coughing fit.  Unable to get them above 90% and patient with labored breathing.  Placed on 3L O2 via nasal cannula.  Regenia Skeeter, EDP made aware.  Will wean as tolerated.

## 2020-07-01 NOTE — H&P (Signed)
History and Physical    Aaron Drake:[id number] DOB: 15-Jan-1956 DOA: 07/01/2020  PCP: Vivi Barrack, MD   Patient coming from: HOME  Chief Complaint  Patient presents with  . Shortness of Breath      HPI: Aaron Drake is a 64 y.o. male with medical history significant for asthma, diabetes, hypertension, hyperlipidemia, obesity presents to the ED with complaint of worsening shortness of breath.  Reports that he was dealing with "bronchitis" for past 2 weeks.  He has had televisit and was prescribed amoxicillin.  He has been having shortness of breath cough with clear and yellow sputum.  He reports last 3 days he has been having worsening shortness of breath.  He also complains of dysuria and was switched to Macrobid as his urine culture was showing Enterococcus.  He had received 1 dose of Covid vaccination and is due for next one next week.  ED Course: Borderline hypoxic 10% on room air, mildly tachycardic, tachypneic, blood pressure stable.  Placed in 3l  and saturating at 94%.  Chest x-ray showed multifocal pulmonary infiltrates consistent with multifocal pneumonia.  Initially received ceftriaxone and azithromycin.  Covid came back positive and is being placed on Covid treatment.  CRP 5.4, #1.0, LFTs mildly elevated LFTs ALT 100 Patient was requested for further management.  Review of Systems: All systems were reviewed and were negative except as mentioned in HPI above. Negative for fever Negative for chest pain Negative for focal weakness  Past Medical History:  Diagnosis Date  . Allergy   . Arthritis   . Asthma 05/12/2012  . BPH (benign prostatic hyperplasia) 05/19/2012  . Diabetes mellitus without complication ([place])   . Dyspnea    physical stress; exercise   . History of kidney stones   . Hyperlipidemia   . Impaired glucose tolerance 05/19/2012  . Tear of meniscus of right knee 2018   partial tear    Past Surgical History:  Procedure Laterality Date  . CATARACT  EXTRACTION Right   . EXTRACORPOREAL SHOCK WAVE LITHOTRIPSY Left 05/08/2018   Procedure: LEFT EXTRACORPOREAL SHOCK WAVE LITHOTRIPSY (ESWL);  Surgeon: [name], MD;  Location: [name];  Service: Urology;  Laterality: Left;  . INSERTION OF MESH N/A 05/17/2016   Procedure: INSERTION OF MESH;  Surgeon: Rolm Bookbinder, MD;  Location: [place];  Service: General;  Laterality: N/A;  . UMBILICAL HERNIA REPAIR N/A 05/17/2016   Procedure: UMBILICAL HERNIA REPAIR;  Surgeon: Rolm Bookbinder, MD;  Location: [place];  Service: General;  Laterality: N/A;  . WISDOM TOOTH EXTRACTION       reports that he has quit smoking. He has never used smokeless tobacco. He reports current alcohol use of about 1.0 standard drink of alcohol per week. He reports that he does not use drugs.  Allergies  Allergen Reactions  . Lipitor [Atorvastatin] Other (See Comments)    Muscle cramps and joint ache    Family History  Problem Relation Age of Onset  . Heart disease Mother   . Lung cancer Mother   . Stroke Father   . COPD Father        Smoker  . Aortic aneurysm Father   . Colon cancer Maternal Uncle      Prior to Admission medications   Medication Sig Start Date End Date Taking? Authorizing Provider  albuterol (PROAIR HFA) 108 (90 Base) MCG/ACT inhaler Inhale 2 puffs into the lungs every 4 (four) hours as needed for wheezing or shortness of breath. 06/25/20  Inda Coke, PA  Albuterol Sulfate (PROAIR RESPICLICK) 696 (90 Base) MCG/ACT AEPB Inhale 2 puffs into the lungs every 4 (four) hours as needed. 04/29/20   Kozlow, Donnamarie Poag, MD  alfuzosin (UROXATRAL) 10 MG 24 hr tablet Take 10 mg by mouth daily with breakfast.    [provider]  amoxicillin-clavulanate (AUGMENTIN) 875-125 MG tablet Take 1 tablet by mouth 2 (two) times daily. 06/25/20   Inda Coke, PA  aspirin EC 81 MG tablet Take 1 tablet (81 mg total) by mouth daily. 07/04/18   Briscoe Deutscher, DO  Calcium Carb-Cholecalciferol (CALCIUM + D3 PO)  Take 1 tablet by mouth daily.    [provider]  CRANBERRY PO Take 2-3 capsules by mouth daily.    [provider]  DUPIXENT 300 MG/2ML prefilled syringe INJECT 300 MG SUBCUTANEOUSLY EVERY OTHER WEEK 12/19/19   Kozlow, Donnamarie Poag, MD  dutasteride (AVODART) 0.5 MG capsule Take 1 capsule (0.5 mg total) by mouth daily. 01/24/20   Vivi Barrack, MD  EPINEPHrine (EPIPEN 2-PAK) 0.3 mg/0.3 mL IJ SOAJ injection Inject 0.3 mLs (0.3 mg total) into the muscle as needed for anaphylaxis. 03/28/19   Kozlow, Donnamarie Poag, MD  losartan (COZAAR) 50 MG tablet TAKE 1/2 TABLET (25MG       TOTAL) DAILY 05/28/20   Vivi Barrack, MD  montelukast (SINGULAIR) 10 MG tablet TAKE 1 TABLET DAILY 05/28/20   Kozlow, Donnamarie Poag, MD  Multiple Vitamin (MULTIVITAMIN WITH MINERALS) TABS tablet Take 1 tablet by mouth daily.    [provider]  naproxen sodium (ALEVE) 220 MG tablet Take 1 tablet (220 mg total) by mouth every 8 (eight) hours as needed (For pain.). 05/11/18   [name], MD  nitrofurantoin, macrocrystal-monohydrate, (MACROBID) 100 MG capsule Take 1 capsule (100 mg total) by mouth 2 (two) times daily. 06/28/20   Inda Coke, PA  omeprazole (PRILOSEC) 20 MG capsule TAKE 1 CAPSULE DAILY 04/10/20   Kozlow, Donnamarie Poag, MD  OZEMPIC, 1 MG/DOSE, 2 MG/1.5ML SOPN INJECT 1 MG INTO THE SKIN ONCE A WEEK. 11/24/19   Vivi Barrack, MD  predniSONE (DELTASONE) 20 MG tablet Take 2 tablets (40 mg total) by mouth daily. 06/25/20   Inda Coke, PA  QVAR REDIHALER 80 MCG/ACT inhaler USE 2 INHALATIONS ORALLY   TWICE DAILY TO PREVENT     COUGH OR WHEEZE; RINSE,    GARGLE AND SPIT AFTER USE 09/05/19   Kozlow, Donnamarie Poag, MD  saw palmetto 500 MG capsule Take 500 mg by mouth daily.    [provider]    Physical Exam: Vitals:   07/01/20 1430 07/01/20 1500 07/01/20 1530 07/01/20 1608  BP: (!) 147/99 (!) 151/74 140/79   Pulse: (!) 104 (!) 106 (!) 113 (!) 105  Resp: (!) 26 (!) 23 (!) 25 (!) 28  Temp:      TempSrc:       SpO2: 95% 94% 94% 91%  Weight:      Height:        General exam: AAOx3 , NAD, weak appearing. HEENT:Oral mucosa moist, Ear/Nose WNL grossly, dentition normal. Respiratory system: bilaterally crackles,no use of accessory muscle Cardiovascular system: S1 & S2 +, No JVD,. Gastrointestinal system: Abdomen soft, NT,ND, BS+ Nervous System:Alert, awake, moving extremities and grossly nonfocal Extremities: No edema, distal peripheral pulses palpable.  Skin: No rashes,no icterus. MSK: Normal muscle bulk,tone, power   Labs on Admission: I have personally reviewed following labs and imaging studies  CBC: Recent Labs  Lab 07/01/20 0907  WBC  9.7  NEUTROABS 7.2  HGB 14.2  HCT 43.2  MCV 86.1  PLT 277   Basic Metabolic Panel: Recent Labs  Lab 07/01/20 0907  NA 136  K 3.7  CL 104  CO2 22  GLUCOSE 139*  BUN 14  CREATININE 0.64  CALCIUM 8.6*   GFR: Estimated Creatinine Clearance: 96.6 mL/min (by C-G formula based on SCr of 0.64 mg/dL). Liver Function Tests: Recent Labs  Lab 07/01/20 0907  AST 39  ALT 100*  ALKPHOS 54  BILITOT 0.9  PROT 7.4  ALBUMIN 3.7   No results for input(s): LIPASE, AMYLASE in the last 168 hours. No results for input(s): AMMONIA in the last 168 hours. Coagulation Profile: No results for input(s): INR, PROTIME in the last 168 hours. Cardiac Enzymes: No results for input(s): CKTOTAL, CKMB, CKMBINDEX, TROPONINI in the last 168 hours. BNP (last 3 results) No results for input(s): PROBNP in the last 8760 hours. HbA1C: No results for input(s): HGBA1C in the last 72 hours. CBG: Recent Labs  Lab 07/01/20 1333  GLUCAP 123*   Lipid Profile: Recent Labs    07/01/20 0907  TRIG 79   Thyroid Function Tests: No results for input(s): TSH, T4TOTAL, FREET4, T3FREE, THYROIDAB in the last 72 hours. Anemia Panel: Recent Labs    07/01/20 0907  FERRITIN 232   Urine analysis:    Component Value Date/Time   COLORURINE YELLOW 07/01/2020 1545    APPEARANCEUR CLEAR 07/01/2020 1545   LABSPEC 1.031 (H) 07/01/2020 1545   PHURINE 6.0 07/01/2020 1545   GLUCOSEU NEGATIVE 07/01/2020 1545   GLUCOSEU NEGATIVE 12/17/2019 1041   HGBUR NEGATIVE 07/01/2020 1545   BILIRUBINUR NEGATIVE 07/01/2020 1545   BILIRUBINUR Negative 06/25/2020 1036   KETONESUR 20 (A) 07/01/2020 1545   PROTEINUR 30 (A) 07/01/2020 1545   UROBILINOGEN 1.0 06/25/2020 1036   UROBILINOGEN 0.2 12/17/2019 1041   NITRITE NEGATIVE 07/01/2020 1545   LEUKOCYTESUR NEGATIVE 07/01/2020 1545    Radiological Exams on Admission: DG Chest 2 View  Result Date: 07/01/2020 CLINICAL DATA:  Shortness of breath. EXAM: CHEST - 2 VIEW COMPARISON:  04/20/2006. FINDINGS: Mediastinum hilar structures normal. Multifocal bilateral pulmonary infiltrates most consistent with multifocal pneumonia noted. Question patient's COVID status. Bibasilar atelectasis. No pleural effusion or pneumothorax. IMPRESSION: Multifocal bilateral pulmonary infiltrates most consistent with multifocal pneumonia. Question patient's COVID status. Electronically Signed   By: Marcello Moores  Register   On: 07/01/2020 07:58    Assessment/Plan  Pneumonia due to COVID-19 virus: Patient will be admitted we will keep on remdesivir, steroid-Decadron and inhaler vitamin C, zinc tablet.  Monitor asymptomatic markers, procalcitonin, LFTs serially.  Continue supplemental oxygen.  Acute hypoxic respiratory failure due to #1.  Continue supplemental oxygen.  Continue steroids inhaler as above.  Mild transaminitis with elevated ALT 2/2 covid.Will monitor daily.  Asthma: Stable continue bronchodilators and steroids,Singulair.  Recent UTI with Enterococcus on Macrobid.  Continue the same  BPH: Continue his Avodart  Type 2 diabetes mellitus without complication, without long-term current use of insulin .  Last hba1c stable 6.1 July 19.  Add linagliptin for mortality benefits in Covid.  Add sliding scale insulin.  Hypertension: BP well controlled.  hold losartan-reassess for losartan in the morning  GERD: Continue PPI  Obesity with BMI Body mass index is 36.56 kg/m.   The treatment plan and use of medications discussed with patient/family, they were clearly explained that there is no proven definitive treatment for COVID-19 infection, any medications used here are based on published clinical articles/anecdotal data which are  not peer-reviewed or randomized control trials.  Complete risks and long-term side effects are unknown, however in the best clinical judgment they seem to be of some clinical benefit rather than medical risks.  Patient/family agree with the treatment plan and want to receive the given medications.  Severity of Illness: * I certify that at the point of admission it is my clinical judgment that the patient will require inpatient hospital care spanning beyond 2 midnights from the point of admission due to high intensity of service, high risk for further deterioration and high frequency of surveillance required.*  DVT prophylaxis:  Code Status:   Code Status: Full Code  Family Communication: Admission, patients condition and plan of care including tests being ordered have been discussed with the patient who indicate understanding and agree with the plan and Code Status.  Consults called:   Antonieta Pert MD [facility]  If 7PM-7AM, please contact night-coverage [email or web address]  07/01/2020, 5:18 PM

## 2020-07-01 NOTE — ED Notes (Signed)
Patient aware we need urine sample, doesn't feel like he can provide one at this time.  States he is very dry and has not had anything to drink since last night.  Patient given cup of ice water after clearing it with Regenia Skeeter, EDP.

## 2020-07-01 NOTE — ED Provider Notes (Signed)
[facility] DEPT Provider Note   CSN: [id number] Arrival date & time: 07/01/20  0707     History Chief Complaint  Patient presents with  . Shortness of Breath    Aaron Drake is a 64 y.o. male.  HPI 64 year old male presents with shortness of breath.  He has been dealing with "bronchitis" for 2 weeks.  He states he had a televisit was prescribed amoxicillin.  Cough has some clear and yellow sputum.  However over the last 3 days his shortness of breath is worse.  No vomiting, fever, or chest pain.  He also has been having dysuria for a couple weeks and was switched to [place] because his culture was showing Enterococcus.  He has received 1 dose of his Covid vaccination and is due the next one next week.   Past Medical History:  Diagnosis Date  . Allergy   . Arthritis   . Asthma 05/12/2012  . BPH (benign prostatic hyperplasia) 05/19/2012  . Diabetes mellitus without complication ([place])   . Dyspnea    physical stress; exercise   . History of kidney stones   . Hyperlipidemia   . Impaired glucose tolerance 05/19/2012  . Tear of meniscus of right knee 2018   partial tear    Patient Active Problem List   Diagnosis Date Noted  . Pneumonia due to COVID-19 virus 07/01/2020  . Asthma, severe persistent, well-controlled 10/30/2019  . Other allergic rhinitis 10/30/2019  . Other atopic dermatitis 10/30/2019  . Gastroesophageal reflux disease 10/30/2019  . Hypertension associated with diabetes ([place]) 04/30/2019  . Erosive oral lichen planus, Dx via Bx, Tx with Dexamethasone rinse, followed by Periodontist 01/28/2019  . Primary osteoarthritis of right knee 08/15/2018  . Myalgia due to statin 07/04/2018  . Renal calculi 06/21/2018  . Morbid obesity ([place]) 03/26/2017  . Type 2 diabetes mellitus without complication, without long-term current use of insulin ([place]) 03/26/2017  . Left rotator cuff tear arthropathy 01/28/2014  . BPH (benign prostatic hyperplasia)  05/19/2012  . Hyperlipidemia associated with type 2 diabetes mellitus ([place])   . Asthma 05/12/2012    Past Surgical History:  Procedure Laterality Date  . CATARACT EXTRACTION Right   . EXTRACORPOREAL SHOCK WAVE LITHOTRIPSY Left 05/08/2018   Procedure: LEFT EXTRACORPOREAL SHOCK WAVE LITHOTRIPSY (ESWL);  Surgeon: [name], MD;  Location: [name];  Service: Urology;  Laterality: Left;  . INSERTION OF MESH N/A 05/17/2016   Procedure: INSERTION OF MESH;  Surgeon: Rolm Bookbinder, MD;  Location: [facility];  Service: General;  Laterality: N/A;  . UMBILICAL HERNIA REPAIR N/A 05/17/2016   Procedure: UMBILICAL HERNIA REPAIR;  Surgeon: Rolm Bookbinder, MD;  Location: [facility];  Service: General;  Laterality: N/A;  . WISDOM TOOTH EXTRACTION         Family History  Problem Relation Age of Onset  . Heart disease Mother   . Lung cancer Mother   . Stroke Father   . COPD Father        Smoker  . Aortic aneurysm Father   . Colon cancer Maternal Uncle     Social History   Tobacco Use  . Smoking status: Former Research scientist (life sciences)  . Smokeless tobacco: Never Used  Vaping Use  . Vaping Use: Never used  Substance Use Topics  . Alcohol use: Yes    Alcohol/week: 1.0 standard drink    Types: 1 Shots of liquor per week    Comment: rare  . Drug use: No    Home Medications Prior to  Admission medications   Medication Sig Start Date End Date Taking? Authorizing Provider  albuterol (PROAIR HFA) 108 (90 Base) MCG/ACT inhaler Inhale 2 puffs into the lungs every 4 (four) hours as needed for wheezing or shortness of breath. 06/25/20   Inda Coke, PA  Albuterol Sulfate (PROAIR RESPICLICK) 096 (90 Base) MCG/ACT AEPB Inhale 2 puffs into the lungs every 4 (four) hours as needed. 04/29/20   Kozlow, Donnamarie Poag, MD  alfuzosin (UROXATRAL) 10 MG 24 hr tablet Take 10 mg by mouth daily with breakfast.    [provider]  amoxicillin-clavulanate (AUGMENTIN) 875-125 MG tablet Take 1 tablet by mouth 2 (two) times daily.  06/25/20   Inda Coke, PA  aspirin EC 81 MG tablet Take 1 tablet (81 mg total) by mouth daily. 07/04/18   Briscoe Deutscher, DO  Calcium Carb-Cholecalciferol (CALCIUM + D3 PO) Take 1 tablet by mouth daily.    [provider]  CRANBERRY PO Take 2-3 capsules by mouth daily.    [provider]  DUPIXENT 300 MG/2ML prefilled syringe INJECT 300 MG SUBCUTANEOUSLY EVERY OTHER WEEK 12/19/19   Kozlow, Donnamarie Poag, MD  dutasteride (AVODART) 0.5 MG capsule Take 1 capsule (0.5 mg total) by mouth daily. 01/24/20   Vivi Barrack, MD  EPINEPHrine (EPIPEN 2-PAK) 0.3 mg/0.3 mL IJ SOAJ injection Inject 0.3 mLs (0.3 mg total) into the muscle as needed for anaphylaxis. 03/28/19   Kozlow, Donnamarie Poag, MD  losartan (COZAAR) 50 MG tablet TAKE 1/2 TABLET (25MG       TOTAL) DAILY 05/28/20   Vivi Barrack, MD  montelukast (SINGULAIR) 10 MG tablet TAKE 1 TABLET DAILY 05/28/20   Kozlow, Donnamarie Poag, MD  Multiple Vitamin (MULTIVITAMIN WITH MINERALS) TABS tablet Take 1 tablet by mouth daily.    [provider]  naproxen sodium (ALEVE) 220 MG tablet Take 1 tablet (220 mg total) by mouth every 8 (eight) hours as needed (For pain.). 05/11/18   [name], MD  nitrofurantoin, macrocrystal-monohydrate, (MACROBID) 100 MG capsule Take 1 capsule (100 mg total) by mouth 2 (two) times daily. 06/28/20   Inda Coke, PA  omeprazole (PRILOSEC) 20 MG capsule TAKE 1 CAPSULE DAILY 04/10/20   Kozlow, Donnamarie Poag, MD  OZEMPIC, 1 MG/DOSE, 2 MG/1.5ML SOPN INJECT 1 MG INTO THE SKIN ONCE A WEEK. 11/24/19   Vivi Barrack, MD  predniSONE (DELTASONE) 20 MG tablet Take 2 tablets (40 mg total) by mouth daily. 06/25/20   Inda Coke, PA  QVAR REDIHALER 80 MCG/ACT inhaler USE 2 INHALATIONS ORALLY   TWICE DAILY TO PREVENT     COUGH OR WHEEZE; RINSE,    GARGLE AND SPIT AFTER USE 09/05/19   Kozlow, Donnamarie Poag, MD  saw palmetto 500 MG capsule Take 500 mg by mouth daily.    [provider]    Allergies    Lipitor  [atorvastatin]  Review of Systems   Review of Systems  Constitutional: Negative for fever.  Respiratory: Positive for cough and shortness of breath.   Cardiovascular: Negative for chest pain.  Gastrointestinal: Negative for abdominal pain and vomiting.  Genitourinary: Positive for dysuria.  All other systems reviewed and are negative.   Physical Exam Updated Vital Signs BP 140/84   Pulse (!) 104   Temp 98.7 F (37.1 C) (Oral)   Resp 18   Ht 5\' 4"  (1.626 m)   Wt 96.6 kg   SpO2 93%   BMI 36.56 kg/m   Physical Exam Vitals and nursing note reviewed.  Constitutional:  General: He is not in acute distress.    Appearance: He is well-developed. He is obese. He is not diaphoretic.  HENT:     Head: Normocephalic and atraumatic.     Right Ear: External ear normal.     Left Ear: External ear normal.     Nose: Nose normal.  Eyes:     General:        Right eye: No discharge.        Left eye: No discharge.  Cardiovascular:     Rate and Rhythm: Regular rhythm. Tachycardia present.     Heart sounds: Normal heart sounds.  Pulmonary:     Effort: Pulmonary effort is normal. Tachypnea present.     Breath sounds: Examination of the right-lower field reveals rales. Examination of the left-lower field reveals rales. Rales present.  Abdominal:     Palpations: Abdomen is soft.     Tenderness: There is no abdominal tenderness.  Musculoskeletal:     Cervical back: Neck supple.  Skin:    General: Skin is warm and dry.  Neurological:     Mental Status: He is alert.  Psychiatric:        Mood and Affect: Mood is not anxious.     ED Results / Procedures / Treatments   Labs (all labs ordered are listed, but only abnormal results are displayed) Labs Reviewed  SARS CORONAVIRUS 2 BY RT PCR ([place], [place] [facility]) - Abnormal; Notable for the following components:      Result Value   SARS Coronavirus 2 POSITIVE (*)    All other components within normal  limits  CBC WITH DIFFERENTIAL/PLATELET - Abnormal; Notable for the following components:   Abs Immature Granulocytes 0.11 (*)    All other components within normal limits  COMPREHENSIVE METABOLIC PANEL - Abnormal; Notable for the following components:   Glucose, Bld 139 (*)    Calcium 8.6 (*)    ALT 100 (*)    All other components within normal limits  D-DIMER, QUANTITATIVE (NOT AT [facility]) - Abnormal; Notable for the following components:   D-Dimer, Quant 1.04 (*)    All other components within normal limits  LACTATE DEHYDROGENASE - Abnormal; Notable for the following components:   LDH 251 (*)    All other components within normal limits  FIBRINOGEN - Abnormal; Notable for the following components:   Fibrinogen 540 (*)    All other components within normal limits  C-REACTIVE PROTEIN - Abnormal; Notable for the following components:   CRP 5.4 (*)    All other components within normal limits  CULTURE, BLOOD (ROUTINE X 2)  CULTURE, BLOOD (ROUTINE X 2)  LACTIC ACID, PLASMA  PROCALCITONIN  FERRITIN  LACTIC ACID, PLASMA  TRIGLYCERIDES  URINALYSIS, ROUTINE W REFLEX MICROSCOPIC    EKG EKG Interpretation  Date/Time:  Tuesday July 01 2020 08:03:55 EDT Ventricular Rate:  103 PR Interval:    QRS Duration: 95 QT Interval:  336 QTC Calculation: 440 R Axis:   72 Text Interpretation: Sinus tachycardia no acute ST/T changes No old tracing to compare Confirmed by Sherwood Gambler [id number]) on 07/01/2020 9:58:01 AM   Radiology DG Chest 2 View  Result Date: 07/01/2020 CLINICAL DATA:  Shortness of breath. EXAM: CHEST - 2 VIEW COMPARISON:  04/20/2006. FINDINGS: Mediastinum hilar structures normal. Multifocal bilateral pulmonary infiltrates most consistent with multifocal pneumonia noted. Question patient's COVID status. Bibasilar atelectasis. No pleural effusion or pneumothorax. IMPRESSION: Multifocal bilateral pulmonary infiltrates most consistent with multifocal pneumonia.  Question patient's  COVID status. Electronically Signed   By: Marcello Moores  Register   On: 07/01/2020 07:58    Procedures Procedures (including critical care time)  Medications Ordered in ED Medications  dexamethasone (DECADRON) injection 6 mg (has no administration in time range)  cefTRIAXone (ROCEPHIN) 1 g in sodium chloride 0.9 % 100 mL IVPB (0 g Intravenous Stopped 07/01/20 1044)  azithromycin (ZITHROMAX) 500 mg in sodium chloride 0.9 % 250 mL IVPB ( Intravenous Stopped 07/01/20 1042)    ED Course  I have reviewed the triage vital signs and the nursing notes.  Pertinent labs & imaging results that were available during my care of the patient were reviewed by me and considered in my medical decision making (see chart for details).    MDM Rules/Calculators/A&P                          Patient's Covid PCR is positive.  Initially given antibiotics but it appears this is all likely from the novel coronavirus.  He is not in respiratory distress though he is tachypneic which has improved with O2 supplementation.  His O2 sats had previously dropped down to 88%.  I think he will need admission with supportive care including remdesivir and Decadron.  Discussed with hospitalist for admission.  Aaron Drake was evaluated in Emergency Department on 07/01/2020 for the symptoms described in the history of present illness. He was evaluated in the context of the global COVID-19 pandemic, which necessitated consideration that the patient might be at risk for infection with the SARS-CoV-2 virus that causes COVID-19. Institutional protocols and algorithms that pertain to the evaluation of patients at risk for COVID-19 are in a state of rapid change based on information released by regulatory bodies including the CDC and federal and state organizations. These policies and algorithms were followed during the patient's care in the ED.  Final Clinical Impression(s) / ED Diagnoses Final diagnoses:  Pneumonia due to COVID-19 virus  Acute  respiratory failure with hypoxia [facility])    Rx / DC Orders ED Discharge Orders    None       Sherwood Gambler, MD 07/01/20 1150

## 2020-07-02 ENCOUNTER — Other Ambulatory Visit: Payer: Self-pay | Admitting: Allergy and Immunology

## 2020-07-02 DIAGNOSIS — R338 Other retention of urine: Secondary | ICD-10-CM

## 2020-07-02 DIAGNOSIS — I10 Essential (primary) hypertension: Secondary | ICD-10-CM

## 2020-07-02 DIAGNOSIS — E1159 Type 2 diabetes mellitus with other circulatory complications: Secondary | ICD-10-CM

## 2020-07-02 DIAGNOSIS — E785 Hyperlipidemia, unspecified: Secondary | ICD-10-CM

## 2020-07-02 DIAGNOSIS — K219 Gastro-esophageal reflux disease without esophagitis: Secondary | ICD-10-CM

## 2020-07-02 DIAGNOSIS — J45909 Unspecified asthma, uncomplicated: Secondary | ICD-10-CM

## 2020-07-02 DIAGNOSIS — N401 Enlarged prostate with lower urinary tract symptoms: Secondary | ICD-10-CM

## 2020-07-02 DIAGNOSIS — E1169 Type 2 diabetes mellitus with other specified complication: Secondary | ICD-10-CM

## 2020-07-02 DIAGNOSIS — E119 Type 2 diabetes mellitus without complications: Secondary | ICD-10-CM

## 2020-07-02 LAB — CBC WITH DIFFERENTIAL/PLATELET
Abs Immature Granulocytes: 0.09 10*3/uL — ABNORMAL HIGH (ref 0.00–0.07)
Basophils Absolute: 0 10*3/uL (ref 0.0–0.1)
Basophils Relative: 0 %
Eosinophils Absolute: 0 10*3/uL (ref 0.0–0.5)
Eosinophils Relative: 0 %
HCT: 42.1 % (ref 39.0–52.0)
Hemoglobin: 13.6 g/dL (ref 13.0–17.0)
Immature Granulocytes: 1 %
Lymphocytes Relative: 22 %
Lymphs Abs: 1.6 10*3/uL (ref 0.7–4.0)
MCH: 27.9 pg (ref 26.0–34.0)
MCHC: 32.3 g/dL (ref 30.0–36.0)
MCV: 86.4 fL (ref 80.0–100.0)
Monocytes Absolute: 0.8 10*3/uL (ref 0.1–1.0)
Monocytes Relative: 11 %
Neutro Abs: 4.8 10*3/uL (ref 1.7–7.7)
Neutrophils Relative %: 66 %
Platelets: 315 10*3/uL (ref 150–400)
RBC: 4.87 MIL/uL (ref 4.22–5.81)
RDW: 13.7 % (ref 11.5–15.5)
WBC: 7.3 10*3/uL (ref 4.0–10.5)
nRBC: 0 % (ref 0.0–0.2)

## 2020-07-02 LAB — GLUCOSE, CAPILLARY
Glucose-Capillary: 112 mg/dL — ABNORMAL HIGH (ref 70–99)
Glucose-Capillary: 114 mg/dL — ABNORMAL HIGH (ref 70–99)
Glucose-Capillary: 123 mg/dL — ABNORMAL HIGH (ref 70–99)
Glucose-Capillary: 168 mg/dL — ABNORMAL HIGH (ref 70–99)
Glucose-Capillary: 184 mg/dL — ABNORMAL HIGH (ref 70–99)
Glucose-Capillary: 214 mg/dL — ABNORMAL HIGH (ref 70–99)

## 2020-07-02 LAB — COMPREHENSIVE METABOLIC PANEL
ALT: 78 U/L — ABNORMAL HIGH (ref 0–44)
AST: 24 U/L (ref 15–41)
Albumin: 3.4 g/dL — ABNORMAL LOW (ref 3.5–5.0)
Alkaline Phosphatase: 49 U/L (ref 38–126)
Anion gap: 10 (ref 5–15)
BUN: 15 mg/dL (ref 8–23)
CO2: 24 mmol/L (ref 22–32)
Calcium: 9 mg/dL (ref 8.9–10.3)
Chloride: 105 mmol/L (ref 98–111)
Creatinine, Ser: 0.61 mg/dL (ref 0.61–1.24)
GFR calc Af Amer: >60 mL/min (ref >60)
GFR calc non Af Amer: >60 mL/min (ref >60)
Glucose, Bld: 137 mg/dL — ABNORMAL HIGH (ref 70–99)
Potassium: 4.2 mmol/L (ref 3.5–5.1)
Sodium: 139 mmol/L (ref 135–145)
Total Bilirubin: 0.8 mg/dL (ref 0.3–1.2)
Total Protein: 7 g/dL (ref 6.5–8.1)

## 2020-07-02 LAB — C-REACTIVE PROTEIN: CRP: 11.4 mg/dL — ABNORMAL HIGH (ref <1.0)

## 2020-07-02 LAB — D-DIMER, QUANTITATIVE: D-Dimer, Quant: 1.28 ug/mL-FEU — ABNORMAL HIGH (ref 0.00–0.50)

## 2020-07-02 MED ORDER — ENOXAPARIN SODIUM 60 MG/0.6ML ~~LOC~~ SOLN
50.0000 mg | Freq: Every day | SUBCUTANEOUS | Status: DC
  Administered 2020-07-02 – 2020-07-04 (×3): 50 mg via SUBCUTANEOUS
  Filled 2020-07-02 (×2): qty 0.6

## 2020-07-02 MED ORDER — DUPILUMAB 300 MG/2ML ~~LOC~~ SOSY: 300.0000 mg | PREFILLED_SYRINGE | SUBCUTANEOUS | Status: DC

## 2020-07-02 NOTE — Progress Notes (Signed)
PROGRESS NOTE  Aaron Drake  JKD:[id number] DOB: [birth year] DOA: 07/01/2020 PCP: Vivi Barrack, MD   Brief Narrative: Aaron Drake is a 64 y.o. male with a history of T2DM, HTN, HLD, obesity (BMI 36), a single dose of covid vaccine, and asthma on dupixent who presented to the ED with dyspnea despite taking augmentin for bronchitis as an outpatient. He was found to be hypoxemic, tachypneic with multifocal pulmonary infiltrates and positive SARS-CoV-2 PCR. Remdesivir and decadron started for covid-19 pneumonia.   Assessment & Plan: Principal Problem:   Pneumonia due to COVID-19 virus Active Problems:   Asthma   BPH (benign prostatic hyperplasia)   Hyperlipidemia associated with type 2 diabetes mellitus (HCC)   Morbid obesity (HCC)   Type 2 diabetes mellitus without complication, without long-term current use of insulin ([place])   Hypertension associated with diabetes ([place])   Gastroesophageal reflux disease  Acute hypoxemic respiratory failure due to covid-19 pneumonia: SARS-CoV-2 PCR positive on 8/3. Partially vaccinated.  - Recommend 2nd dose of vaccination between 21 (after isolation period ends) and 90 (before immunity is thought to wane) days from positive test.  - Continue remdesivir x5 days (8/3 - 8/7) - Steroids x10 days (8/3 - 8/12) due to hypoxemia. - Vitamin C, zinc - Encourage OOB, IS, FV, and awake proning if able - Check CBC w/diff, CMP, CRP daily - Enoxaparin prophylactic dose.  - Maintain euvolemia/net negative.   Asthma: No exacerbation at this time. - Will continue dupilumab as he is due for this anyway, and IL-13 is thought to be a driver of disease severity in covid-19 infections. In early studies (limited quality of data), a more mild form of disease than would be expected was noted in these patients. There are no guidelines in this regard at this time based on my literature review 07/02/2020. - Continue singulair, inhaled bronchodilators.   NIDT2DM: Well-controlled  with HbA1c 6%.  - SSI, linagliptin   LFT elevation:  - Consider work up if worsening, though his is likely due to covid infection.  Obesity: Estimated body mass index is 36.56 kg/m as calculated from the following:   Height as of this encounter: 5\' 4"  (1.626 m).   Weight as of this encounter: 96.6 kg.   GERD:  - Continue PPI  BPH:  - Continue home medications  Enterococcus UTI:  - Continue macrobid.  DVT prophylaxis: Lovenox 40mg  q24h Code Status: Full Family Communication: None at bedside Disposition Plan:  Status is: Inpatient  Remains inpatient appropriate because:Inpatient level of care appropriate due to severity of illness   Dispo: The patient is from: Home              Anticipated d/c is to: Home              Anticipated d/c date is: 3 days              Patient currently is not medically stable to d/c.  Consultants:   None  Procedures:   None  Antimicrobials:  Remdesivir  Macrobid   Subjective: Shortness of breath is improved, still winded with any exertion at all. No chest pain. No leg swell or orthopnea.  Objective: Vitals:   07/01/20 2034 07/02/20 0020 07/02/20 0411 07/02/20 1543  BP: 136/76 134/79 134/79 140/78  Pulse: 95 85 75 95  Resp: (!) 24 20 (!) 22 19  Temp: 98.5 F (36.9 C) 98 F (36.7 C) 97.9 F (36.6 C) 97.8 F (36.6 C)  TempSrc: Oral Oral Oral  Oral  SpO2: 97% 97% 96% 96%  Weight:      Height:        Intake/Output Summary (Last 24 hours) at 07/02/2020 1856 Last data filed at 07/02/2020 1400 Gross per 24 hour  Intake 680.22 ml  Output --  Net 680.22 ml   Filed Weights   07/01/20 0723  Weight: 96.6 kg    Gen: 64 y.o. male in no distress Pulm: Non-labored breathing supplemental oxygen, crackles mild bibasilar.  CV: Regular rate and rhythm. No murmur, rub, or gallop. No JVD, no pedal edema. GI: Abdomen soft, non-tender, non-distended, with normoactive bowel sounds. No organomegaly or masses felt. Ext: Warm, no  deformities Skin: No rashes, lesions no ulcers Neuro: Alert and oriented. No focal neurological deficits. Psych: Judgement and insight appear normal. Mood & affect appropriate.   Data Reviewed: I have personally reviewed following labs and imaging studies  CBC: Recent Labs  Lab 07/01/20 0907 07/02/20 0456  WBC 9.7 7.3  NEUTROABS 7.2 4.8  HGB 14.2 13.6  HCT 43.2 42.1  MCV 86.1 86.4  PLT 309 734   Basic Metabolic Panel: Recent Labs  Lab 07/01/20 0907 07/02/20 0456  NA 136 139  K 3.7 4.2  CL 104 105  CO2 22 24  GLUCOSE 139* 137*  BUN 14 15  CREATININE 0.64 0.61  CALCIUM 8.6* 9.0   GFR: Estimated Creatinine Clearance: 96.6 mL/min (by C-G formula based on SCr of 0.61 mg/dL). Liver Function Tests: Recent Labs  Lab 07/01/20 0907 07/02/20 0456  AST 39 24  ALT 100* 78*  ALKPHOS 54 49  BILITOT 0.9 0.8  PROT 7.4 7.0  ALBUMIN 3.7 3.4*   No results for input(s): LIPASE, AMYLASE in the last 168 hours. No results for input(s): AMMONIA in the last 168 hours. Coagulation Profile: No results for input(s): INR, PROTIME in the last 168 hours. Cardiac Enzymes: No results for input(s): CKTOTAL, CKMB, CKMBINDEX, TROPONINI in the last 168 hours. BNP (last 3 results) No results for input(s): PROBNP in the last 8760 hours. HbA1C: No results for input(s): HGBA1C in the last 72 hours. CBG: Recent Labs  Lab 07/02/20 0016 07/02/20 0413 07/02/20 0823 07/02/20 1137 07/02/20 1640  GLUCAP 168* 114* 112* 123* 214*   Lipid Profile: Recent Labs    07/01/20 0907  TRIG 79   Thyroid Function Tests: No results for input(s): TSH, T4TOTAL, FREET4, T3FREE, THYROIDAB in the last 72 hours. Anemia Panel: Recent Labs    07/01/20 0907  FERRITIN 232   Urine analysis:    Component Value Date/Time   COLORURINE YELLOW 07/01/2020 1545   APPEARANCEUR CLEAR 07/01/2020 1545   LABSPEC 1.031 (H) 07/01/2020 1545   PHURINE 6.0 07/01/2020 1545   GLUCOSEU NEGATIVE 07/01/2020 1545   GLUCOSEU  NEGATIVE 12/17/2019 1041   HGBUR NEGATIVE 07/01/2020 1545   BILIRUBINUR NEGATIVE 07/01/2020 1545   BILIRUBINUR Negative 06/25/2020 1036   KETONESUR 20 (A) 07/01/2020 1545   PROTEINUR 30 (A) 07/01/2020 1545   UROBILINOGEN 1.0 06/25/2020 1036   UROBILINOGEN 0.2 12/17/2019 1041   NITRITE NEGATIVE 07/01/2020 1545   LEUKOCYTESUR NEGATIVE 07/01/2020 1545   Recent Results (from the past 240 hour(s))  Urine Culture     Status: Abnormal   Collection Time: 06/25/20  2:55 PM   Specimen: Urine  Result Value Ref Range Status   MICRO NUMBER: [id number]  Final   SPECIMEN QUALITY: Adequate  Final   Sample Source URINE  Final   STATUS: FINAL  Final   ISOLATE 1: Enterococcus faecalis (  A)  Final    Comment: Greater than 100,000 CFU/mL of Enterococcus faecalis      Susceptibility   Enterococcus faecalis - URINE CULTURE POSITIVE 1    AMPICILLIN <=2 Sensitive     VANCOMYCIN 2 Sensitive     NITROFURANTOIN* <=16 Sensitive      * Legend:S = Susceptible  I = IntermediateR = Resistant  NS = Not susceptible* = Not tested  NR = Not reported**NN = See antimicrobic comments  SARS Coronavirus 2 by RT PCR (hospital order, performed in [facility] hospital lab) Nasopharyngeal Nasopharyngeal Swab     Status: Abnormal   Collection Time: 07/01/20  9:07 AM   Specimen: Nasopharyngeal Swab  Result Value Ref Range Status   SARS Coronavirus 2 POSITIVE (A) NEGATIVE Final    Comment: RESULT CALLED TO, READ BACK BY AND VERIFIED WITH: GOLDSTON, MD @1125  ON 08.03.2021 BY COHEN,K (NOTE) SARS-CoV-2 target nucleic acids are DETECTED  SARS-CoV-2 RNA is generally detectable in upper respiratory specimens  during the acute phase of infection.  Positive results are indicative  of the presence of the identified virus, but do not rule out bacterial infection or co-infection with other pathogens not detected by the test.  Clinical correlation with patient history and  other diagnostic information is necessary to determine  patient infection status.  The expected result is negative.  Fact Sheet for Patients:   StrictlyIdeas.no   Fact Sheet for Healthcare Providers:   BankingDealers.co.za    This test is not yet approved or cleared by the [place] FDA and  has been authorized for detection and/or diagnosis of SARS-CoV-2 by FDA under an Emergency Use Authorization (EUA).  This EUA will remain in effect (meanin g this test can be used) for the duration of  the COVID-19 declaration under Section 564(b)(1) of the Act, 21 U.S.C. section 360-bbb-3(b)(1), unless the authorization is terminated or revoked sooner.  Performed at [facility], [place] [address] [place], [place] [address]   Blood Culture (routine x 2)     Status: None (Preliminary result)   Collection Time: 07/01/20  9:38 AM   Specimen: BLOOD LEFT HAND  Result Value Ref Range Status   Specimen Description   Final    BLOOD LEFT HAND Performed at [facility] [address], [place], [place] [address]    Special Requests   Final    BOTTLES DRAWN AEROBIC AND ANAEROBIC Blood Culture adequate volume Performed at [facility] [address] [place], [place] [address]    Culture   Final    NO GROWTH < 24 HOURS Performed at [facility] [address], [place], [place] [address]    Report Status PENDING  Incomplete  Blood Culture (routine x 2)     Status: None (Preliminary result)   Collection Time: 07/01/20  9:38 AM   Specimen: BLOOD RIGHT HAND  Result Value Ref Range Status   Specimen Description   Final    BLOOD RIGHT HAND Performed at [facility] [address] [place], [place] [address]    Special Requests   Final    BOTTLES DRAWN AEROBIC AND ANAEROBIC Blood Culture adequate volume Performed at [facility] [address], [place], [place] [address]    Culture   Final    NO GROWTH  < 24 HOURS Performed at [facility] [address], [place], [place] [address]    Report Status PENDING  Incomplete  Radiology Studies: DG Chest 2 View  Result Date: 07/01/2020 CLINICAL DATA:  Shortness of breath. EXAM: CHEST - 2 VIEW COMPARISON:  04/20/2006. FINDINGS: Mediastinum hilar structures normal. Multifocal bilateral pulmonary infiltrates most consistent with multifocal pneumonia noted. Question patient's COVID status. Bibasilar atelectasis. No pleural effusion or pneumothorax. IMPRESSION: Multifocal bilateral pulmonary infiltrates most consistent with multifocal pneumonia. Question patient's COVID status. Electronically Signed   By: Marcello Moores  Register   On: 07/01/2020 07:58    Scheduled Meds:  albuterol  2 puff Inhalation Q6H   alfuzosin  10 mg Oral Q breakfast   vitamin C  500 mg Oral Daily   aspirin EC  81 mg Oral Daily   beclomethasone  2 puff Inhalation BID   calcium-vitamin D  2 tablet Oral Q breakfast   dexamethasone (DECADRON) injection  6 mg Intravenous Q24H   dutasteride  0.5 mg Oral Daily   enoxaparin (LOVENOX) injection  50 mg Subcutaneous Daily   insulin aspart  0-15 Units Subcutaneous TID WC   linagliptin  5 mg Oral Daily   montelukast  10 mg Oral QHS   multivitamin with minerals  1 tablet Oral Daily   nitrofurantoin (macrocrystal-monohydrate)  100 mg Oral BID   pantoprazole  40 mg Oral Daily   zinc sulfate  220 mg Oral Daily   Continuous Infusions:  remdesivir 100 mg in NS 100 mL 100 mg (07/02/20 0953)     LOS: 1 day   Time spent: 25 minutes.  Patrecia Pour, MD [facility] [email or web address] 07/02/2020, 6:56 PM

## 2020-07-02 NOTE — Plan of Care (Signed)
  Problem: Education: Goal: Knowledge of risk factors and measures for prevention of condition will improve Outcome: Progressing   Problem: Respiratory: Goal: Will maintain a patent airway Outcome: Progressing Goal: Complications related to the disease process, condition or treatment will be avoided or minimized Outcome: Progressing   

## 2020-07-03 LAB — COMPREHENSIVE METABOLIC PANEL
ALT: 60 U/L — ABNORMAL HIGH (ref 0–44)
AST: 18 U/L (ref 15–41)
Albumin: 3.3 g/dL — ABNORMAL LOW (ref 3.5–5.0)
Alkaline Phosphatase: 46 U/L (ref 38–126)
Anion gap: 7 (ref 5–15)
BUN: 15 mg/dL (ref 8–23)
CO2: 22 mmol/L (ref 22–32)
Calcium: 8.8 mg/dL — ABNORMAL LOW (ref 8.9–10.3)
Chloride: 105 mmol/L (ref 98–111)
Creatinine, Ser: 0.6 mg/dL — ABNORMAL LOW (ref 0.61–1.24)
GFR calc Af Amer: >60 mL/min (ref >60)
GFR calc non Af Amer: >60 mL/min (ref >60)
Glucose, Bld: 136 mg/dL — ABNORMAL HIGH (ref 70–99)
Potassium: 3.7 mmol/L (ref 3.5–5.1)
Sodium: 134 mmol/L — ABNORMAL LOW (ref 135–145)
Total Bilirubin: 0.6 mg/dL (ref 0.3–1.2)
Total Protein: 6.7 g/dL (ref 6.5–8.1)

## 2020-07-03 LAB — GLUCOSE, CAPILLARY
Glucose-Capillary: 111 mg/dL — ABNORMAL HIGH (ref 70–99)
Glucose-Capillary: 114 mg/dL — ABNORMAL HIGH (ref 70–99)
Glucose-Capillary: 211 mg/dL — ABNORMAL HIGH (ref 70–99)
Glucose-Capillary: 151 mg/dL — ABNORMAL HIGH (ref 70–99)

## 2020-07-03 LAB — CBC WITH DIFFERENTIAL/PLATELET
Abs Immature Granulocytes: 0.13 10*3/uL — ABNORMAL HIGH (ref 0.00–0.07)
Basophils Absolute: 0 10*3/uL (ref 0.0–0.1)
Basophils Relative: 0 %
Eosinophils Absolute: 0 10*3/uL (ref 0.0–0.5)
Eosinophils Relative: 0 %
HCT: 41.3 % (ref 39.0–52.0)
Hemoglobin: 13.3 g/dL (ref 13.0–17.0)
Immature Granulocytes: 1 %
Lymphocytes Relative: 13 %
Lymphs Abs: 1.7 10*3/uL (ref 0.7–4.0)
MCH: 28.4 pg (ref 26.0–34.0)
MCHC: 32.2 g/dL (ref 30.0–36.0)
MCV: 88.1 fL (ref 80.0–100.0)
Monocytes Absolute: 1.1 10*3/uL — ABNORMAL HIGH (ref 0.1–1.0)
Monocytes Relative: 8 %
Neutro Abs: 10.1 10*3/uL — ABNORMAL HIGH (ref 1.7–7.7)
Neutrophils Relative %: 78 %
Platelets: 326 10*3/uL (ref 150–400)
RBC: 4.69 MIL/uL (ref 4.22–5.81)
RDW: 13.7 % (ref 11.5–15.5)
WBC: 13.1 10*3/uL — ABNORMAL HIGH (ref 4.0–10.5)
nRBC: 0 % (ref 0.0–0.2)

## 2020-07-03 LAB — D-DIMER, QUANTITATIVE: D-Dimer, Quant: 1.33 ug/mL-FEU — ABNORMAL HIGH (ref 0.00–0.50)

## 2020-07-03 LAB — C-REACTIVE PROTEIN: CRP: 6.1 mg/dL — ABNORMAL HIGH (ref <1.0)

## 2020-07-03 NOTE — Plan of Care (Signed)

## 2020-07-03 NOTE — Progress Notes (Signed)
Oxygen saturations 94% on room air while ambulating

## 2020-07-03 NOTE — Progress Notes (Signed)
PROGRESS NOTE  Aaron Drake  GGY:[id number] DOB: 1956-06-10 DOA: 07/01/2020 PCP: Vivi Barrack, MD   Brief Narrative: Aaron Drake is a 64 y.o. male with a history of T2DM, HTN, HLD, obesity (BMI 36), a single dose of covid vaccine, and asthma on dupixent who presented to the ED with dyspnea despite taking augmentin for bronchitis as an outpatient. He was found to be hypoxemic, tachypneic with multifocal pulmonary infiltrates and positive SARS-CoV-2 PCR. Remdesivir and decadron started for covid-19 pneumonia.   Assessment & Plan: Principal Problem:   Pneumonia due to COVID-19 virus Active Problems:   Asthma   BPH (benign prostatic hyperplasia)   Hyperlipidemia associated with type 2 diabetes mellitus (HCC)   Morbid obesity (HCC)   Type 2 diabetes mellitus without complication, without long-term current use of insulin ([place])   Hypertension associated with diabetes ([place])   Gastroesophageal reflux disease  Acute hypoxemic respiratory failure due to covid-19 pneumonia: SARS-CoV-2 PCR positive on 8/3. Partially vaccinated.  - Continue efforts at weaning to room air. - Recommend 2nd dose of vaccination between 21 (after isolation period ends) and 90 (before immunity is thought to wane) days from positive test.  - Continue remdesivir x5 days (8/3 - 8/7) - Steroids x10 days (8/3 - 8/12) due to hypoxemia. - Encourage OOB, IS, FV, and awake proning if able - Enoxaparin prophylactic dose.  - Maintain euvolemia/net negative.   Asthma: No exacerbation at this time. - Dupilumab ordered 8/4 to remain on q14 day track. As noted, IL-13 is thought to be a driver of disease severity in covid-19 infections. In early studies (limited quality of data), a more mild form of disease than would be expected was noted in these patients. There are no guidelines in this regard at this time based on literature review 07/02/2020. - Continue singulair, inhaled bronchodilators.   NIDT2DM: Well-controlled with HbA1c  6%.  - SSI, linagliptin   LFT elevation:  - Improving, likely due to covid infection.  Obesity: Estimated body mass index is 36.56 kg/m as calculated from the following:   Height as of this encounter: 5\' 4"  (1.626 m).   Weight as of this encounter: 96.6 kg.   GERD:  - Continue PPI  BPH:  - Continue home medications  Enterococcus UTI:  - Continue macrobid x5 days.  DVT prophylaxis: Lovenox 40mg  q24h Code Status: Full Family Communication: None at bedside Disposition Plan:  Status is: Inpatient  Remains inpatient appropriate because:Inpatient level of care appropriate due to severity of illness, remains hypoxemic.   Dispo: The patient is from: Home              Anticipated d/c is to: Home              Anticipated d/c date is: 1 day               Patient currently is not medically stable to d/c.  Consultants:   None  Procedures:   None  Antimicrobials:  Remdesivir  Macrobid   Subjective: Dyspneic on exertion, got down off oxygen this morning but still requiring it at rest at time of encounter. No chest pain, feels continual improvement overall day by day. Lives alone. Still has very slight burning with urination.  Objective: Vitals:   07/02/20 1543 07/02/20 2133 07/03/20 0617 07/03/20 1416  BP: 140/78 132/78 135/77 135/72  Pulse: 95 78 88 (!) 101  Resp: 19 20 18 20   Temp: 97.8 F (36.6 C) 98.3 F (36.8 C) 97.7 F (  36.5 C) 98.3 F (36.8 C)  TempSrc: Oral Oral Oral Oral  SpO2: 96% 96% 95% 93%  Weight:      Height:        Intake/Output Summary (Last 24 hours) at 07/03/2020 1636 Last data filed at 07/03/2020 0900 Gross per 24 hour  Intake 240 ml  Output 400 ml  Net -160 ml   Filed Weights   07/01/20 0723  Weight: 96.6 kg   Gen: 64 y.o. male in no distress Pulm: Nonlabored breathing supplemental oxygen. Clearing crackles, no wheezes, good aeration. CV: Regular rate and rhythm. No murmur, rub, or gallop. No JVD, no dependent edema. GI: Abdomen  soft, non-tender, non-distended, with normoactive bowel sounds.  Ext: Warm, no deformities Skin: No rashes, lesions or ulcers on visualized skin. Neuro: Alert and oriented. No focal neurological deficits. Psych: Judgement and insight appear fair. Mood euthymic & affect congruent. Behavior is appropriate.    Data Reviewed: I have personally reviewed following labs and imaging studies  CBC: Recent Labs  Lab 07/01/20 0907 07/02/20 0456 07/03/20 0411  WBC 9.7 7.3 13.1*  NEUTROABS 7.2 4.8 10.1*  HGB 14.2 13.6 13.3  HCT 43.2 42.1 41.3  MCV 86.1 86.4 88.1  PLT 309 315 016   Basic Metabolic Panel: Recent Labs  Lab 07/01/20 0907 07/02/20 0456 07/03/20 0411  NA 136 139 134*  K 3.7 4.2 3.7  CL 104 105 105  CO2 22 24 22   GLUCOSE 139* 137* 136*  BUN 14 15 15   CREATININE 0.64 0.61 0.60*  CALCIUM 8.6* 9.0 8.8*   GFR: Estimated Creatinine Clearance: 96.6 mL/min (A) (by C-G formula based on SCr of 0.6 mg/dL (L)). Liver Function Tests: Recent Labs  Lab 07/01/20 0907 07/02/20 0456 07/03/20 0411  AST 39 24 18  ALT 100* 78* 60*  ALKPHOS 54 49 46  BILITOT 0.9 0.8 0.6  PROT 7.4 7.0 6.7  ALBUMIN 3.7 3.4* 3.3*   No results for input(s): LIPASE, AMYLASE in the last 168 hours. No results for input(s): AMMONIA in the last 168 hours. Coagulation Profile: No results for input(s): INR, PROTIME in the last 168 hours. Cardiac Enzymes: No results for input(s): CKTOTAL, CKMB, CKMBINDEX, TROPONINI in the last 168 hours. BNP (last 3 results) No results for input(s): PROBNP in the last 8760 hours. HbA1C: No results for input(s): HGBA1C in the last 72 hours. CBG: Recent Labs  Lab 07/02/20 1137 07/02/20 1640 07/02/20 2129 07/03/20 0755 07/03/20 1153  GLUCAP 123* 214* 184* 111* 114*   Lipid Profile: Recent Labs    07/01/20 0907  TRIG 79   Thyroid Function Tests: No results for input(s): TSH, T4TOTAL, FREET4, T3FREE, THYROIDAB in the last 72 hours. Anemia Panel: Recent Labs     07/01/20 0907  FERRITIN 232   Urine analysis:    Component Value Date/Time   COLORURINE YELLOW 07/01/2020 1545   APPEARANCEUR CLEAR 07/01/2020 1545   LABSPEC 1.031 (H) 07/01/2020 1545   PHURINE 6.0 07/01/2020 1545   GLUCOSEU NEGATIVE 07/01/2020 Aaron Drake 12/17/2019 1041   HGBUR NEGATIVE 07/01/2020 1545   BILIRUBINUR NEGATIVE 07/01/2020 1545   BILIRUBINUR Negative 06/25/2020 1036   KETONESUR 20 (A) 07/01/2020 1545   PROTEINUR 30 (A) 07/01/2020 1545   UROBILINOGEN 1.0 06/25/2020 1036   UROBILINOGEN 0.2 12/17/2019 1041   NITRITE NEGATIVE 07/01/2020 1545   LEUKOCYTESUR NEGATIVE 07/01/2020 1545   Recent Results (from the past 240 hour(s))  Urine Culture     Status: Abnormal   Collection Time: 06/25/20  2:55 PM   Specimen: Urine  Result Value Ref Range Status   MICRO NUMBER: [id number]  Final   SPECIMEN QUALITY: Adequate  Final   Sample Source URINE  Final   STATUS: FINAL  Final   ISOLATE 1: Enterococcus faecalis (A)  Final    Comment: Greater than 100,000 CFU/mL of Enterococcus faecalis      Susceptibility   Enterococcus faecalis - URINE CULTURE POSITIVE 1    AMPICILLIN <=2 Sensitive     VANCOMYCIN 2 Sensitive     NITROFURANTOIN* <=16 Sensitive      * Legend:S = Susceptible  I = IntermediateR = Resistant  NS = Not susceptible* = Not tested  NR = Not reported**NN = See antimicrobic comments  SARS Coronavirus 2 by RT PCR (hospital order, performed in [facility] hospital lab) Nasopharyngeal Nasopharyngeal Swab     Status: Abnormal   Collection Time: 07/01/20  9:07 AM   Specimen: Nasopharyngeal Swab  Result Value Ref Range Status   SARS Coronavirus 2 POSITIVE (A) NEGATIVE Final    Comment: RESULT CALLED TO, READ BACK BY AND VERIFIED WITH: GOLDSTON, MD @1125  ON 08.03.2021 BY COHEN,K (NOTE) SARS-CoV-2 target nucleic acids are DETECTED  SARS-CoV-2 RNA is generally detectable in upper respiratory specimens  during the acute phase of infection.  Positive results are  indicative  of the presence of the identified virus, but do not rule out bacterial infection or co-infection with other pathogens not detected by the test.  Clinical correlation with patient history and  other diagnostic information is necessary to determine patient infection status.  The expected result is negative.  Fact Sheet for Patients:   StrictlyIdeas.no   Fact Sheet for Healthcare Providers:   BankingDealers.co.za    This test is not yet approved or cleared by the [place] FDA and  has been authorized for detection and/or diagnosis of SARS-CoV-2 by FDA under an Emergency Use Authorization (EUA).  This EUA will remain in effect (meanin g this test can be used) for the duration of  the COVID-19 declaration under Section 564(b)(1) of the Act, 21 U.S.C. section 360-bbb-3(b)(1), unless the authorization is terminated or revoked sooner.  Performed at [facility], [place] [address] [place], [place] [address]   Blood Culture (routine x 2)     Status: None (Preliminary result)   Collection Time: 07/01/20  9:38 AM   Specimen: BLOOD LEFT HAND  Result Value Ref Range Status   Specimen Description   Final    BLOOD LEFT HAND Performed at [facility] [address] [place], [place] [address]    Special Requests   Final    BOTTLES DRAWN AEROBIC AND ANAEROBIC Blood Culture adequate volume Performed at [facility] [address], [place], [place] [address]    Culture   Final    NO GROWTH 2 DAYS Performed at [facility] [address], [place], [place] [address]    Report Status PENDING  Incomplete  Blood Culture (routine x 2)     Status: None (Preliminary result)   Collection Time: 07/01/20  9:38 AM   Specimen: BLOOD RIGHT HAND  Result Value Ref Range Status   Specimen Description   Final    BLOOD RIGHT HAND Performed at [facility]  [address] [place],  [address]    Special Requests   Final    BOTTLES DRAWN AEROBIC AND ANAEROBIC Blood Culture adequate volume Performed at [facility],  [place] [address] [place], [place] [address]    Culture   Final    NO GROWTH 2 DAYS Performed at [facility] [address], [place], [place] [address]    Report Status PENDING  Incomplete      Radiology Studies: No results found.  Scheduled Meds: . albuterol  2 puff Inhalation Q6H  . alfuzosin  10 mg Oral Q breakfast  . vitamin C  500 mg Oral Daily  . aspirin EC  81 mg Oral Daily  . beclomethasone  2 puff Inhalation BID  . calcium-vitamin D  2 tablet Oral Q breakfast  . dexamethasone (DECADRON) injection  6 mg Intravenous Q24H  . dupilumab  300 mg Subcutaneous Q14 Days  . dutasteride  0.5 mg Oral Daily  . enoxaparin (LOVENOX) injection  50 mg Subcutaneous Daily  . insulin aspart  0-15 Units Subcutaneous TID WC  . linagliptin  5 mg Oral Daily  . montelukast  10 mg Oral QHS  . multivitamin with minerals  1 tablet Oral Daily  . nitrofurantoin (macrocrystal-monohydrate)  100 mg Oral BID  . pantoprazole  40 mg Oral Daily  . zinc sulfate  220 mg Oral Daily   Continuous Infusions: . remdesivir 100 mg in NS 100 mL 100 mg (07/03/20 1158)     LOS: 2 days   Time spent: 25 minutes.  Patrecia Pour, MD [facility] [email or web address] 07/03/2020, 4:36 PM

## 2020-07-04 LAB — CBC WITH DIFFERENTIAL/PLATELET
Abs Immature Granulocytes: 0.18 10*3/uL — ABNORMAL HIGH (ref 0.00–0.07)
Basophils Absolute: 0 10*3/uL (ref 0.0–0.1)
Basophils Relative: 0 %
Eosinophils Absolute: 0.1 10*3/uL (ref 0.0–0.5)
Eosinophils Relative: 1 %
HCT: 41.1 % (ref 39.0–52.0)
Hemoglobin: 13.9 g/dL (ref 13.0–17.0)
Immature Granulocytes: 2 %
Lymphocytes Relative: 17 %
Lymphs Abs: 1.7 10*3/uL (ref 0.7–4.0)
MCH: 28.6 pg (ref 26.0–34.0)
MCHC: 33.8 g/dL (ref 30.0–36.0)
MCV: 84.6 fL (ref 80.0–100.0)
Monocytes Absolute: 0.9 10*3/uL (ref 0.1–1.0)
Monocytes Relative: 9 %
Neutro Abs: 7.4 10*3/uL (ref 1.7–7.7)
Neutrophils Relative %: 71 %
Platelets: 277 10*3/uL (ref 150–400)
RBC: 4.86 MIL/uL (ref 4.22–5.81)
RDW: 13.5 % (ref 11.5–15.5)
WBC: 10.3 10*3/uL (ref 4.0–10.5)
nRBC: 0 % (ref 0.0–0.2)

## 2020-07-04 LAB — COMPREHENSIVE METABOLIC PANEL
ALT: 53 U/L — ABNORMAL HIGH (ref 0–44)
AST: 19 U/L (ref 15–41)
Albumin: 3.3 g/dL — ABNORMAL LOW (ref 3.5–5.0)
Alkaline Phosphatase: 47 U/L (ref 38–126)
Anion gap: 8 (ref 5–15)
BUN: 16 mg/dL (ref 8–23)
CO2: 23 mmol/L (ref 22–32)
Calcium: 8.8 mg/dL — ABNORMAL LOW (ref 8.9–10.3)
Chloride: 106 mmol/L (ref 98–111)
Creatinine, Ser: 0.66 mg/dL (ref 0.61–1.24)
GFR calc Af Amer: >60 mL/min (ref >60)
GFR calc non Af Amer: >60 mL/min (ref >60)
Glucose, Bld: 110 mg/dL — ABNORMAL HIGH (ref 70–99)
Potassium: 3.7 mmol/L (ref 3.5–5.1)
Sodium: 137 mmol/L (ref 135–145)
Total Bilirubin: 0.8 mg/dL (ref 0.3–1.2)
Total Protein: 6.9 g/dL (ref 6.5–8.1)

## 2020-07-04 LAB — GLUCOSE, CAPILLARY
Glucose-Capillary: 95 mg/dL (ref 70–99)
Glucose-Capillary: 118 mg/dL — ABNORMAL HIGH (ref 70–99)

## 2020-07-04 LAB — D-DIMER, QUANTITATIVE: D-Dimer, Quant: 1.05 ug/mL-FEU — ABNORMAL HIGH (ref 0.00–0.50)

## 2020-07-04 LAB — C-REACTIVE PROTEIN: CRP: 4.6 mg/dL — ABNORMAL HIGH (ref <1.0)

## 2020-07-04 MED ORDER — DEXAMETHASONE 6 MG PO TABS
6.0000 mg | ORAL_TABLET | Freq: Every day | ORAL | 0 refills | Status: DC
Start: 2020-07-04 — End: 2020-07-10

## 2020-07-04 NOTE — Discharge Instructions (Signed)
You are scheduled for outpatient Remdesivir infusion at 10am Saturday 8/7 at [facility]. Please park at [place], as staff will be escorting you through the [facility] entrance of the hospital.    There is a wave flag banner located near the entrance on [address]. Turn into this entrance and immediately turn left and park in 1 of the 5 designated Covid Infusion Parking spots. There is a phone number on the sign, please stay in your car and call and let the staff know what spot you are in and we will come out and get you. For questions call [phone].  Thanks.

## 2020-07-04 NOTE — Discharge Summary (Signed)
Physician Discharge Summary  Aaron Drake EXN:[id number] DOB: January 24, 1956 DOA: 07/01/2020  PCP: Vivi Barrack, MD  Admit date: 07/01/2020 Discharge date: 07/05/2020  Admitted From: Home Disposition: Home   Recommendations for Outpatient Follow-up:  1. Follow up with PCP in 1-2 weeks remotely or after isolation period ends in the office (would be 8/25).  Home Health: None Equipment/Devices: None Discharge Condition: Stable CODE STATUS: Full Diet recommendation: Heart healthy, carb-modified  Brief/Interim Summary: ESPEN Drake is a 64 y.o. male with a history of T2DM, HTN, HLD, obesity (BMI 36), a single dose of covid vaccine, and asthma on dupixent who presented to the ED with dyspnea despite taking augmentin for bronchitis as an outpatient. He was found to be hypoxemic, tachypneic with multifocal pulmonary infiltrates and positive SARS-CoV-2 PCR. He was admitted for acute hypoxemic respiratory failure due to covid-19 pneumonia. Remdesivir and decadron were started. [name] hypoxemia steadily improved with treatment.  Discharge Diagnoses:  Principal Problem:   Pneumonia due to COVID-19 virus Active Problems:   Asthma   BPH (benign prostatic hyperplasia)   Hyperlipidemia associated with type 2 diabetes mellitus (HCC)   Morbid obesity (HCC)   Type 2 diabetes mellitus without complication, without long-term current use of insulin ([place])   Hypertension associated with diabetes ([place])   Gastroesophageal reflux disease  Acute hypoxemic respiratory failure due to covid-19 pneumonia: SARS-CoV-2 PCR positive on 8/3. Partially vaccinated.  - Recommend 2nd dose of vaccination between 21 (after isolation period ends) and 90 (before immunity is thought to wane) days from positive test.  - Continue remdesivir x5 days (8/3 - 8/7) in infusion clinic. - Steroids x10 days (8/3 - 8/12) due to hypoxemia.  Asthma: No exacerbation at this time. - Dupilumab to be continued. - Continue singulair,  inhaled bronchodilators.   NIDT2DM: Well-controlled with HbA1c 6%.  - Restart home medications at discharge.  LFT elevation:  - Improving, likely due to covid infection.  Obesity: BMI 36.56 kg/m  GERD:  - Continue PPI  BPH:  - Continue home medications  Enterococcus UTI:  - Continue macrobid x5 days.  Discharge Instructions Discharge Instructions    MyChart COVID-19 home monitoring program   Complete by: Jul 04, 2020    Is the patient willing to use the [facility] for home monitoring?: Yes   Discharge instructions   Complete by: As directed    You are being discharged from the hospital after treatment for covid-19 infection. You are felt to be stable enough to no longer require inpatient monitoring, testing, and treatment, though you will need to follow the recommendations below: - Continue taking decadron for 6 more doses starting tomorrow morning.  - Return to the infusion center tomorrow as scheduled to complete the final dose of remdesivir. Instructions are provided elsewhere in this paperwork. - Per CDC guidelines, you will need to remain in isolation for 21 days from your first positive covid test. Activity is as tolerated. - Follow up with your doctor in the next week via telehealth or seek medical attention right away if your symptoms get WORSE.   Directions for you at home:  Wear a facemask You should wear a facemask that covers your nose and mouth when you are in the same room with other people and when you visit a healthcare provider. People who live with or visit you should also wear a facemask while they are in the same room with you.  Separate yourself from other people in your home As much as  possible, you should stay in a different room from other people in your home. Also, you should use a separate bathroom, if available.  Avoid sharing household items You should not share dishes, drinking glasses, cups, eating utensils, towels, bedding, or  other items with other people in your home. After using these items, you should wash them thoroughly with soap and water.  Cover your coughs and sneezes Cover your mouth and nose with a tissue when you cough or sneeze, or you can cough or sneeze into your sleeve. Throw used tissues in a lined trash can, and immediately wash your hands with soap and water for at least 20 seconds or use an alcohol-based hand rub.  Wash your [facility] your hands often and thoroughly with soap and water for at least 20 seconds. You can use an alcohol-based hand sanitizer if soap and water are not available and if your hands are not visibly dirty. Avoid touching your eyes, nose, and mouth with unwashed hands.  Directions for those who live with, or provide care at home for you:  Limit the number of people who have contact with the patient If possible, have only one caregiver for the patient. Other household members should stay in another home or place of residence. If this is not possible, they should stay in another room, or be separated from the patient as much as possible. Use a separate bathroom, if available. Restrict visitors who do not have an essential need to be in the home.  Ensure good ventilation Make sure that shared spaces in the home have good air flow, such as from an air conditioner or an opened window, weather permitting.  Wash your hands often Wash your hands often and thoroughly with soap and water for at least 20 seconds. You can use an alcohol based hand sanitizer if soap and water are not available and if your hands are not visibly dirty. Avoid touching your eyes, nose, and mouth with unwashed hands. Use disposable paper towels to dry your hands. If not available, use dedicated cloth towels and replace them when they become wet.  Wear a facemask and gloves Wear a disposable facemask at all times in the room and gloves when you touch or have contact with the patient's blood, body  fluids, and/or secretions or excretions, such as sweat, saliva, sputum, nasal mucus, vomit, urine, or feces.  Ensure the mask fits over your nose and mouth tightly, and do not touch it during use. Throw out disposable facemasks and gloves after using them. Do not reuse. Wash your hands immediately after removing your facemask and gloves. If your personal clothing becomes contaminated, carefully remove clothing and launder. Wash your hands after handling contaminated clothing. Place all used disposable facemasks, gloves, and other waste in a lined container before disposing them with other household waste. Remove gloves and wash your hands immediately after handling these items.  Do not share dishes, glasses, or other household items with the patient Avoid sharing household items. You should not share dishes, drinking glasses, cups, eating utensils, towels, bedding, or other items with a patient who is confirmed to have, or being evaluated for, COVID-19 infection. After the person uses these items, you should wash them thoroughly with soap and water.  Wash laundry thoroughly Immediately remove and wash clothes or bedding that have blood, body fluids, and/or secretions or excretions, such as sweat, saliva, sputum, nasal mucus, vomit, urine, or feces, on them. Wear gloves when handling laundry from the patient. Read and  follow directions on labels of laundry or clothing items and detergent. In general, wash and dry with the warmest temperatures recommended on the label.  Clean all areas the individual has used often Clean all touchable surfaces, such as counters, tabletops, doorknobs, bathroom fixtures, toilets, phones, keyboards, tablets, and bedside tables, every day. Also, clean any surfaces that may have blood, body fluids, and/or secretions or excretions on them. Wear gloves when cleaning surfaces the patient has come in contact with. Use a diluted bleach solution (e.g., dilute bleach with 1  part bleach and 10 parts water) or a household disinfectant with a label that says EPA-registered for coronaviruses. To make a bleach solution at home, add 1 tablespoon of bleach to 1 quart (4 cups) of water. For a larger supply, add  cup of bleach to 1 gallon (16 cups) of water. Read labels of cleaning products and follow recommendations provided on product labels. Labels contain instructions for safe and effective use of the cleaning product including precautions you should take when applying the product, such as wearing gloves or eye protection and making sure you have good ventilation during use of the product. Remove gloves and wash hands immediately after cleaning.  Monitor yourself for signs and symptoms of illness Caregivers and household members are considered close contacts, should monitor their health, and will be asked to limit movement outside of the home to the extent possible. Follow the monitoring steps for close contacts listed on the symptom monitoring form.  If you have additional questions, contact your local health department or call the epidemiologist on call at [phone] (available 24/7). This guidance is subject to change. For the most up-to-date guidance from [facility], please refer to their website: [email or web address]     Allergies as of 07/04/2020      Reactions   Lipitor [atorvastatin] Other (See Comments)   Muscle cramps and joint ache      Medication List    STOP taking these medications   amoxicillin-clavulanate 875-125 MG tablet Commonly known as: AUGMENTIN   predniSONE 20 MG tablet Commonly known as: DELTASONE     TAKE these medications   albuterol 108 (90 Base) MCG/ACT inhaler Commonly known as: ProAir HFA Inhale 2 puffs into the lungs every 4 (four) hours as needed for wheezing or shortness of breath.   alfuzosin 10 MG 24 hr tablet Commonly known as: UROXATRAL Take 10 mg by mouth daily with  breakfast.   aspirin EC 81 MG tablet Take 1 tablet (81 mg total) by mouth daily.   CALCIUM + D3 PO Take 1 tablet by mouth daily.   CRANBERRY PO Take 2-3 capsules by mouth daily.   dexamethasone 6 MG tablet Commonly known as: Decadron Take 1 tablet (6 mg total) by mouth daily for 6 days.   Dupixent 300 MG/2ML prefilled syringe Generic drug: dupilumab INJECT 300 MG SUBCUTANEOUSLY EVERY OTHER WEEK What changed: See the new instructions.   dutasteride 0.5 MG capsule Commonly known as: AVODART Take 1 capsule (0.5 mg total) by mouth daily.   EPINEPHrine 0.3 mg/0.3 mL Soaj injection Commonly known as: EpiPen 2-Pak Inject 0.3 mLs (0.3 mg total) into the muscle as needed for anaphylaxis.   losartan 50 MG tablet Commonly known as: COZAAR TAKE 1/2 TABLET (25MG       TOTAL) DAILY What changed: See the new instructions.   montelukast 10 MG tablet Commonly known as: SINGULAIR TAKE 1 TABLET DAILY What changed: when to take this   multivitamin with minerals Tabs tablet Take 1 tablet  by mouth daily.   nitrofurantoin (macrocrystal-monohydrate) 100 MG capsule Commonly known as: Macrobid Take 1 capsule (100 mg total) by mouth 2 (two) times daily.   omeprazole 20 MG capsule Commonly known as: PRILOSEC TAKE 1 CAPSULE DAILY   Ozempic (1 MG/DOSE) 2 MG/1.5ML Sopn Generic drug: Semaglutide (1 MG/DOSE) INJECT 1 MG INTO THE SKIN ONCE A WEEK.   Qvar RediHaler 80 MCG/ACT inhaler Generic drug: beclomethasone USE 2 INHALATIONS ORALLY   TWICE DAILY TO PREVENT     COUGH OR WHEEZE; RINSE,    GARGLE AND SPIT AFTER USE What changed: See the new instructions.   saw palmetto 500 MG capsule Take 500 mg by mouth daily.       Follow-up Information    Vivi Barrack, MD. Schedule an appointment as soon as possible for a visit.   Specialty: Family Medicine Why: telehealth visit during isolation period Contact information: [place] [phone] [phone]               Allergies  Allergen Reactions  . Lipitor [Atorvastatin] Other (See Comments)    Muscle cramps and joint ache    Consultations:  None  Procedures/Studies: DG Chest 2 View  Result Date: 07/01/2020 CLINICAL DATA:  Shortness of breath. EXAM: CHEST - 2 VIEW COMPARISON:  04/20/2006. FINDINGS: Mediastinum hilar structures normal. Multifocal bilateral pulmonary infiltrates most consistent with multifocal pneumonia noted. Question patient's COVID status. Bibasilar atelectasis. No pleural effusion or pneumothorax. IMPRESSION: Multifocal bilateral pulmonary infiltrates most consistent with multifocal pneumonia. Question patient's COVID status. Electronically Signed   By: Marcello Moores  Register   On: 07/01/2020 07:58      Subjective: Feels improved, off oxygen and getting around ok. No chest pain, leg swelling orthopnea. No fevers noted.  Discharge Exam: Vitals:   07/04/20 0426 07/04/20 0754  BP: 140/81 (!) 157/87  Pulse: 81 94  Resp: 18   Temp: 98 F (36.7 C) 97.7 F (36.5 C)  SpO2: 96% 92%   General: Pt is alert, awake, not in acute distress Cardiovascular: RRR, S1/S2 +, no rubs, no gallops Respiratory: CTA bilaterally, no wheezing, no rhonchi Abdominal: Soft, NT, ND, bowel sounds + Extremities: No edema, no cyanosis  Labs: Basic Metabolic Panel: Recent Labs  Lab 07/01/20 0907 07/02/20 0456 07/03/20 0411 07/04/20 0558  NA 136 139 134* 137  K 3.7 4.2 3.7 3.7  CL 104 105 105 106  CO2 22 24 22 23   GLUCOSE 139* 137* 136* 110*  BUN 14 15 15 16   CREATININE 0.64 0.61 0.60* 0.66  CALCIUM 8.6* 9.0 8.8* 8.8*   Liver Function Tests: Recent Labs  Lab 07/01/20 0907 07/02/20 0456 07/03/20 0411 07/04/20 0558  AST 39 24 18 19   ALT 100* 78* 60* 53*  ALKPHOS 54 49 46 47  BILITOT 0.9 0.8 0.6 0.8  PROT 7.4 7.0 6.7 6.9  ALBUMIN 3.7 3.4* 3.3* 3.3*   CBC: Recent Labs  Lab 07/01/20 0907 07/02/20 0456 07/03/20 0411 07/04/20 0410  WBC 9.7 7.3 13.1* 10.3  NEUTROABS 7.2 4.8 10.1*  7.4  HGB 14.2 13.6 13.3 13.9  HCT 43.2 42.1 41.3 41.1  MCV 86.1 86.4 88.1 84.6  PLT 309 315 326 277   CBG: Recent Labs  Lab 07/03/20 1153 07/03/20 1635 07/03/20 2207 07/04/20 0758 07/04/20 1154  GLUCAP 114* 211* 151* 95 118*   D-Dimer Recent Labs    07/03/20 0411 07/04/20 0410  DDIMER 1.33* 1.05*   Urinalysis    Component Value Date/Time   COLORURINE YELLOW 07/01/2020  [place] 07/01/2020 1545   LABSPEC 1.031 (H) 07/01/2020 1545   PHURINE 6.0 07/01/2020 1545   [place] 07/01/2020 [place] 12/17/2019 [place] 07/01/2020 1545   [place] 07/01/2020 1545   BILIRUBINUR Negative 06/25/2020 1036   KETONESUR 20 (A) 07/01/2020 1545   PROTEINUR 30 (A) 07/01/2020 1545   UROBILINOGEN 1.0 06/25/2020 1036   UROBILINOGEN 0.2 12/17/2019 1041   NITRITE NEGATIVE 07/01/2020 1545   LEUKOCYTESUR NEGATIVE 07/01/2020 1545    Microbiology Recent Results (from the past 240 hour(s))  SARS Coronavirus 2 by RT PCR (hospital order, performed in [place] hospital lab) Nasopharyngeal Nasopharyngeal Swab     Status: Abnormal   Collection Time: 07/01/20  9:07 AM   Specimen: Nasopharyngeal Swab  Result Value Ref Range Status   SARS Coronavirus 2 POSITIVE (A) NEGATIVE Final    Comment: RESULT CALLED TO, READ BACK BY AND VERIFIED WITH: GOLDSTON, MD @1125  ON 08.03.2021 BY COHEN,K (NOTE) SARS-CoV-2 target nucleic acids are DETECTED  SARS-CoV-2 RNA is generally detectable in upper respiratory specimens  during the acute phase of infection.  Positive results are indicative  of the presence of the identified virus, but do not rule out bacterial infection or co-infection with other pathogens not detected by the test.  Clinical correlation with patient history and  other diagnostic information is necessary to determine patient infection status.  The expected result is negative.  Fact Sheet for Patients:    StrictlyIdeas.no   Fact Sheet for Healthcare Providers:   BankingDealers.co.za    This test is not yet approved or cleared by the [place] FDA and  has been authorized for detection and/or diagnosis of SARS-CoV-2 by FDA under an Emergency Use Authorization (EUA).  This EUA will remain in effect (meanin g this test can be used) for the duration of  the COVID-19 declaration under Section 564(b)(1) of the Act, 21 U.S.C. section 360-bbb-3(b)(1), unless the authorization is terminated or revoked sooner.  Performed at [facility], [place] [address], [place], [place] [address]   Blood Culture (routine x 2)     Status: None (Preliminary result)   Collection Time: 07/01/20  9:38 AM   Specimen: BLOOD LEFT HAND  Result Value Ref Range Status   Specimen Description   Final    BLOOD LEFT HAND Performed at [place] [facility] [address], [place], [place] [address]    Special Requests   Final    BOTTLES DRAWN AEROBIC AND ANAEROBIC Blood Culture adequate volume Performed at [facility] [address], [place], [place] [address]    Culture   Final    NO GROWTH 4 DAYS Performed at [facility], [place] [address], [place], [place] [address]    Report Status PENDING  Incomplete  Blood Culture (routine x 2)     Status: None (Preliminary result)   Collection Time: 07/01/20  9:38 AM   Specimen: BLOOD RIGHT HAND  Result Value Ref Range Status   Specimen Description   Final    BLOOD RIGHT HAND Performed at [facility] [address] [place], [place] [address]    Special Requests   Final    BOTTLES DRAWN AEROBIC AND ANAEROBIC Blood Culture adequate volume Performed at [facility] [address] [place],  [address]    Culture   Final    NO GROWTH 4 DAYS Performed at [facility], [address] [place], [place] [address]     Report Status PENDING  Incomplete    Time coordinating discharge: Approximately 40 minutes  Patrecia Pour, MD  [facility] 07/05/2020, 5:25 PM

## 2020-07-04 NOTE — Plan of Care (Signed)

## 2020-07-04 NOTE — Progress Notes (Signed)
Patient scheduled for outpatient Remdesivir infusion at 10am Saturday 8/7 at [facility]. Please inform the patient to park at [place], as staff will be escorting the patient through the [place] entrance of the hospital.    There is a wave flag banner located near the entrance on [address]. Turn into this entrance and immediately turn left and park in 1 of the 5 designated Covid Infusion Parking spots. There is a phone number on the sign, please call and let the staff know what spot you are in and we will come out and get you. For questions call [phone].  Thanks.

## 2020-07-05 ENCOUNTER — Ambulatory Visit (HOSPITAL_COMMUNITY)
Admit: 2020-07-05 | Discharge: 2020-07-05 | Disposition: A | Discharge status: Home / Self Care | Payer: 59 | Attending: Pulmonary Disease | Admitting: Pulmonary Disease

## 2020-07-05 DIAGNOSIS — J1282 Pneumonia due to coronavirus disease 2019: Secondary | ICD-10-CM | POA: Diagnosis not present

## 2020-07-05 DIAGNOSIS — U071 COVID-19: Secondary | ICD-10-CM | POA: Diagnosis not present

## 2020-07-05 MED ORDER — ALBUTEROL SULFATE HFA 108 (90 BASE) MCG/ACT IN AERS: 2.0000 | INHALATION_SPRAY | Freq: Once | RESPIRATORY_TRACT | Status: DC | PRN

## 2020-07-05 MED ORDER — DIPHENHYDRAMINE HCL 50 MG/ML IJ SOLN: 50.0000 mg | Freq: Once | INTRAMUSCULAR | Status: DC | PRN

## 2020-07-05 MED ORDER — FAMOTIDINE IN NACL 20-0.9 MG/50ML-% IV SOLN: 20.0000 mg | Freq: Once | INTRAVENOUS | Status: DC | PRN

## 2020-07-05 MED ORDER — SODIUM CHLORIDE 0.9 % IV SOLN: INTRAVENOUS | Status: DC | PRN

## 2020-07-05 MED ORDER — EPINEPHRINE 0.3 MG/0.3ML IJ SOAJ: 0.3000 mg | Freq: Once | INTRAMUSCULAR | Status: DC | PRN

## 2020-07-05 MED ORDER — METHYLPREDNISOLONE SODIUM SUCC 125 MG IJ SOLR: 125.0000 mg | Freq: Once | INTRAMUSCULAR | Status: DC | PRN

## 2020-07-05 MED ORDER — SODIUM CHLORIDE 0.9 % IV SOLN
100.0000 mg | Freq: Once | INTRAVENOUS | Status: AC
  Administered 2020-07-05: 100 mg via INTRAVENOUS
  Filled 2020-07-05: qty 20

## 2020-07-05 NOTE — Progress Notes (Addendum)
  Diagnosis: COVID-19  Physician: Dr. Joya Gaskins  Procedure: Covid Infusion Clinic Med: remdesivir infusion - Provided patient with remdesivir fact sheet for patients, parents and caregivers prior to infusion.  Complications: No immediate complications noted.  Discharge: Discharged home   [name] 07/05/2020

## 2020-07-06 LAB — CULTURE, BLOOD (ROUTINE X 2)
Culture: NO GROWTH
Culture: NO GROWTH
Special Requests: ADEQUATE
Special Requests: ADEQUATE

## 2020-07-07 ENCOUNTER — Telehealth: Payer: Self-pay

## 2020-07-07 NOTE — Telephone Encounter (Cosign Needed)
Transition Care Management Follow-up Telephone Call  Date of discharge and from where: 07/04/20 [facility]  How have you been since you were released from the hospital? Getting Stronger everyday  Any questions or concerns? No  Items Reviewed:  Did the pt receive and understand the discharge instructions provided? Yes   Medications obtained and verified? Yes   Any new allergies since your discharge? No   Dietary orders reviewed? Yes  Do you have support at home? Yes   Functional Questionnaire: (I = Independent and D = Dependent) ADLs: I  Bathing/Dressing- I  Meal Prep- I  Eating- I  Maintaining continence- I  Transferring/Ambulation- I  Managing Meds- I  Follow up appointments reviewed:   PCP Hospital f/u appt confirmed? Yes  Scheduled For a VV/Telephone on August 12th @ 3:40 pm.  [facility] f/u appt confirmed? No    Are transportation arrangements needed? No   If their condition worsens, is the pt aware to call PCP or go to the Emergency Dept.? Yes  Was the patient provided with contact information for the PCP's office or ED? Yes  Was to pt encouraged to call back with questions or concerns? Yes

## 2020-07-10 ENCOUNTER — Telehealth (INDEPENDENT_AMBULATORY_CARE_PROVIDER_SITE_OTHER): Payer: 59 | Admitting: Family Medicine

## 2020-07-10 ENCOUNTER — Encounter (INDEPENDENT_AMBULATORY_CARE_PROVIDER_SITE_OTHER): Payer: Self-pay

## 2020-07-10 DIAGNOSIS — J1282 Pneumonia due to coronavirus disease 2019: Secondary | ICD-10-CM

## 2020-07-10 DIAGNOSIS — E119 Type 2 diabetes mellitus without complications: Secondary | ICD-10-CM | POA: Diagnosis not present

## 2020-07-10 DIAGNOSIS — J45909 Unspecified asthma, uncomplicated: Secondary | ICD-10-CM | POA: Diagnosis not present

## 2020-07-10 DIAGNOSIS — U071 COVID-19: Secondary | ICD-10-CM

## 2020-07-10 NOTE — Assessment & Plan Note (Signed)
Seems to be recovering normally.  Discussed normal recovery process with patient.  Discussed importance of deep inspirations and ambulation.

## 2020-07-10 NOTE — Progress Notes (Signed)
Chief Complaint:  Aaron Drake is a 64 y.o. male who presents today for a VIRTUAL TCM visit.  Assessment/Plan:  Problems Addressed Today: Asthma Overall stable.  He is currently taking Singulair, Qvar, Anoro Ellipta, and Dupixent.  Encourage patient to continue working on deep breathing exercises.  Pneumonia due to COVID-19 virus Seems to be recovering normally.  Discussed normal recovery process with patient.  Discussed importance of deep inspirations and ambulation.  Type 2 diabetes mellitus without complication, without long-term current use of insulin ([name]) Patient with steroids while in the hospital.  Blood sugars were mostly well controlled though discussed with patient that next A1c may be slightly elevated due to his steroids.  We will continue Ozempic 1 mg weekly for now.  Will check A1c at his CPE in about 3 months.    Subjective:  HPI:  Summary of Hospital admission: Reason for admission: Covid Pneumonia Date of admission: 07/01/2020 Date of discharge: 07/05/2020 Date of Interactive contact: 07/07/2020 Summary of Hospital course: Patient presented to the ED on 07/01/2020 with progressively worsening shortness of breath.  Found to be hypoxemic tachypneic and had multifocal infiltrates on chest x-ray.  Covid was confirmed via PCR.  He was started on remdesivir and Decadron.  Symptoms stabilized.  He was discharged home on hospital day 4.  Interim history outlined by problem:   Doing well since being discharged home. Still some shortness of breath but it is improving slowly.  He has finished his course of Decadron and antibiotics.  He has good support system.  He has people able to bring him food and groceries.  He will be quarantined for about 21 days.    ROS: Per HPI, otherwise a complete review of systems was negative.   PMH:  The following were reviewed and entered/updated in epic: Past Medical History:  Diagnosis Date  . Allergy   . Arthritis   . Asthma 05/12/2012   . BPH (benign prostatic hyperplasia) 05/19/2012  . Diabetes mellitus without complication ([place])   . Dyspnea    physical stress; exercise   . History of kidney stones   . Hyperlipidemia   . Impaired glucose tolerance 05/19/2012  . Tear of meniscus of right knee 2018   partial tear   Patient Active Problem List   Diagnosis Date Noted  . Pneumonia due to COVID-19 virus 07/01/2020  . Asthma, severe persistent, well-controlled 10/30/2019  . Other allergic rhinitis 10/30/2019  . Other atopic dermatitis 10/30/2019  . Gastroesophageal reflux disease 10/30/2019  . Hypertension associated with diabetes ([name]) 04/30/2019  . Erosive oral lichen planus, Dx via Bx, Tx with Dexamethasone rinse, followed by Periodontist 01/28/2019  . Primary osteoarthritis of right knee 08/15/2018  . Myalgia due to statin 07/04/2018  . Renal calculi 06/21/2018  . Morbid obesity ([name]) 03/26/2017  . Type 2 diabetes mellitus without complication, without long-term current use of insulin ([name]) 03/26/2017  . Left rotator cuff tear arthropathy 01/28/2014  . BPH (benign prostatic hyperplasia) 05/19/2012  . Hyperlipidemia associated with type 2 diabetes mellitus ([name])   . Asthma 05/12/2012   Past Surgical History:  Procedure Laterality Date  . CATARACT EXTRACTION Right   . EXTRACORPOREAL SHOCK WAVE LITHOTRIPSY Left 05/08/2018   Procedure: LEFT EXTRACORPOREAL SHOCK WAVE LITHOTRIPSY (ESWL);  Surgeon: [name], MD;  Location: [name];  Service: Urology;  Laterality: Left;  . INSERTION OF MESH N/A 05/17/2016   Procedure: INSERTION OF MESH;  Surgeon: Rolm Bookbinder, MD;  Location: [name];  Service: General;  Laterality: N/A;  . UMBILICAL HERNIA REPAIR N/A 05/17/2016   Procedure: UMBILICAL HERNIA REPAIR;  Surgeon: Rolm Bookbinder, MD;  [name];  Service: General;  Laterality: N/A;  . WISDOM TOOTH EXTRACTION      Family History  Problem Relation Age of Onset  . Heart disease Mother   . Lung cancer Mother    . Stroke Father   . COPD Father        Smoker  . Aortic aneurysm Father   . Colon cancer Maternal Uncle     Medications- Reconciled discharge and current medications in Epic.  Current Outpatient Medications  Medication Sig Dispense Refill  . albuterol (PROAIR HFA) 108 (90 Base) MCG/ACT inhaler Inhale 2 puffs into the lungs every 4 (four) hours as needed for wheezing or shortness of breath. 18 g 2  . alfuzosin (UROXATRAL) 10 MG 24 hr tablet Take 10 mg by mouth daily with breakfast.    . aspirin EC 81 MG tablet Take 1 tablet (81 mg total) by mouth daily. 90 tablet 3  . Calcium Carb-Cholecalciferol (CALCIUM + D3 PO) Take 1 tablet by mouth daily.    Marland Kitchen CRANBERRY PO Take 2-3 capsules by mouth daily.    . DUPIXENT 300 MG/2ML prefilled syringe INJECT 300 MG SUBCUTANEOUSLY EVERY OTHER WEEK (Patient taking differently: Inject 300 mg into the skin every 14 (fourteen) days. ) 4 mL 10  . dutasteride (AVODART) 0.5 MG capsule Take 1 capsule (0.5 mg total) by mouth daily. 90 capsule 3  . EPINEPHrine (EPIPEN 2-PAK) 0.3 mg/0.3 mL IJ SOAJ injection Inject 0.3 mLs (0.3 mg total) into the muscle as needed for anaphylaxis. 4 Device 3  . losartan (COZAAR) 50 MG tablet TAKE 1/2 TABLET (25MG       TOTAL) DAILY (Patient taking differently: Take 25 mg by mouth daily. TAKE 1/2 TABLET (25MG       TOTAL) DAILY) 45 tablet 1  . montelukast (SINGULAIR) 10 MG tablet TAKE 1 TABLET DAILY (Patient taking differently: Take 10 mg by mouth at bedtime. ) 90 tablet 1  . Multiple Vitamin (MULTIVITAMIN WITH MINERALS) TABS tablet Take 1 tablet by mouth daily.    Marland Kitchen omeprazole (PRILOSEC) 20 MG capsule TAKE 1 CAPSULE DAILY 90 capsule 0  . OZEMPIC, 1 MG/DOSE, 2 MG/1.5ML SOPN INJECT 1 MG INTO THE SKIN ONCE A WEEK. 9 pen 3  . QVAR REDIHALER 80 MCG/ACT inhaler USE 2 INHALATIONS ORALLY   TWICE DAILY TO PREVENT     COUGH OR WHEEZE; RINSE,    GARGLE AND SPIT AFTER USE (Patient taking differently: Inhale 2 puffs into the lungs 2 (two) times daily.  ) 31.8 g 0  . saw palmetto 500 MG capsule Take 500 mg by mouth daily.     No current facility-administered medications for this visit.    Allergies-reviewed and updated Allergies  Allergen Reactions  . Lipitor [Atorvastatin] Other (See Comments)    Muscle cramps and joint ache    Social History   Socioeconomic History  . Marital status: Single    Spouse name: Not on file  . Number of children: Not on file  . Years of education: Not on file  . Highest education level: Not on file  Occupational History  . Not on file  Tobacco Use  . Smoking status: Former Research scientist (life sciences)  . Smokeless tobacco: Never Used  Vaping Use  . Vaping Use: Never used  Substance and Sexual Activity  . Alcohol use: Yes    Alcohol/week: 1.0 standard drink  Types: 1 Shots of liquor per week    Comment: rare  . Drug use: No  . Sexual activity: Not Currently  Other Topics Concern  . Not on file  Social History Narrative  . Not on file   Social Determinants of Health   Financial Resource Strain:   . Difficulty of Paying Living Expenses:   Food Insecurity:   . Worried About Charity fundraiser in the Last Year:   . Arboriculturist in the Last Year:   Transportation Needs:   . Film/video editor (Medical):   Marland Kitchen Lack of Transportation (Non-Medical):   Physical Activity:   . Days of Exercise per Week:   . Minutes of Exercise per Session:   Stress:   . Feeling of Stress :   Social Connections:   . Frequency of Communication with Friends and Family:   . Frequency of Social Gatherings with Friends and Family:   . Attends Religious Services:   . Active Member of Clubs or Organizations:   . Attends Archivist Meetings:   Marland Kitchen Marital Status:         Objective:  Physical Exam: There were no vitals taken for this visit.  Gen: NAD, resting comfortably Pulm: NWOB, speaking in full sentences MSK: No edema, cyanosis, or clubbing noted Skin: Warm, dry  Virtual Visit via Video   I connected  with Iline Oven on 07/10/20 at  3:40 PM EDT by a video enabled telemedicine application and verified that I am speaking with the correct person using two identifiers. The limitations of evaluation and management by telemedicine and the availability of in person appointments were discussed. The patient expressed understanding and agreed to proceed.   Patient location: Home Provider location: [place] participating in the virtual visit: Myself and Patient     Algis Greenhouse. Jerline Pain, MD 07/10/2020 3:56 PM

## 2020-07-10 NOTE — Assessment & Plan Note (Signed)
Overall stable.  He is currently taking Singulair, Qvar, Anoro Ellipta, and Dupixent.  Encourage patient to continue working on deep breathing exercises.

## 2020-07-10 NOTE — Assessment & Plan Note (Signed)
Patient with steroids while in the hospital.  Blood sugars were mostly well controlled though discussed with patient that next A1c may be slightly elevated due to his steroids.  We will continue Ozempic 1 mg weekly for now.  Will check A1c at his CPE in about 3 months.

## 2020-08-02 ENCOUNTER — Encounter: Payer: Self-pay | Admitting: Allergy and Immunology

## 2020-08-05 ENCOUNTER — Other Ambulatory Visit: Payer: Self-pay | Admitting: *Deleted

## 2020-08-05 MED ORDER — EPINEPHRINE 0.3 MG/0.3ML IJ SOAJ: 0.3000 mg | INTRAMUSCULAR | 1 refills | Status: DC | PRN

## 2020-08-21 ENCOUNTER — Encounter: Payer: Self-pay | Admitting: Family Medicine

## 2020-08-26 ENCOUNTER — Ambulatory Visit: Payer: 59

## 2020-08-30 ENCOUNTER — Other Ambulatory Visit: Payer: Self-pay | Admitting: Physician Assistant

## 2020-09-15 ENCOUNTER — Ambulatory Visit: Payer: 59 | Admitting: Family Medicine

## 2020-09-15 ENCOUNTER — Encounter: Payer: Self-pay | Admitting: Family Medicine

## 2020-09-15 ENCOUNTER — Other Ambulatory Visit: Payer: Self-pay

## 2020-09-15 VITALS — BP 103/62 | HR 80 | Temp 97.2°F | Ht 64.0 in | Wt 211.6 lb

## 2020-09-15 DIAGNOSIS — N401 Enlarged prostate with lower urinary tract symptoms: Secondary | ICD-10-CM | POA: Diagnosis not present

## 2020-09-15 DIAGNOSIS — R351 Nocturia: Secondary | ICD-10-CM | POA: Diagnosis not present

## 2020-09-15 DIAGNOSIS — E1159 Type 2 diabetes mellitus with other circulatory complications: Secondary | ICD-10-CM | POA: Diagnosis not present

## 2020-09-15 DIAGNOSIS — I152 Hypertension secondary to endocrine disorders: Secondary | ICD-10-CM

## 2020-09-15 DIAGNOSIS — E119 Type 2 diabetes mellitus without complications: Secondary | ICD-10-CM

## 2020-09-15 DIAGNOSIS — R338 Other retention of urine: Secondary | ICD-10-CM

## 2020-09-15 LAB — POCT GLYCOSYLATED HEMOGLOBIN (HGB A1C): Hemoglobin A1C: 5.9 % — AB (ref 4.0–5.6)

## 2020-09-15 MED ORDER — TADALAFIL 5 MG PO TABS: 5.0000 mg | ORAL_TABLET | Freq: Every day | ORAL | 5 refills | Status: DC

## 2020-09-15 NOTE — Assessment & Plan Note (Signed)
At goal.  Continue losartan 25 mg daily.

## 2020-09-15 NOTE — Assessment & Plan Note (Signed)
Still not controlled.  Continue dutasteride and alfuzosin.  We will also start low-dose Cialis 5mg  daily.  Discussed potential side effects.  He will check in with me in a couple weeks if we need to adjust dose.

## 2020-09-15 NOTE — Progress Notes (Signed)
   Aaron Drake is a 64 y.o. male who presents today for an office visit.  Assessment/Plan:  New/Acute Problems: Epistaxis No red flags.  Recommended intranasal saline spray.  Discussed reasons to return to care  Nocturia Due to BPH.  Check PSA today.  Chronic Problems Addressed Today: BPH (benign prostatic hyperplasia) Still not controlled.  Continue dutasteride and alfuzosin.  We will also start low-dose Cialis 5mg  daily.  Discussed potential side effects.  He will check in with me in a couple weeks if we need to adjust dose.  Hypertension associated with diabetes ([place]) At goal.  Continue losartan 25 mg daily.  Type 2 diabetes mellitus without complication, without long-term current use of insulin ([facility]) A1c 5.9.  Continue Ozempic 1 mg weekly.  Recheck 6 months.     Subjective:  HPI:  Patient here for follow-up.  He is overall doing well.  He has had 1 episode of epistaxis per month for the last few months.  Usually left nostril.  He has noticed dried mucus in that nasal passageway and sometimes has to forcibly remove it.  Bleeding usually subsides within 5 minutes or so.  See A/p for status of chronic conditions.       Objective:  Physical Exam: BP 103/62   Pulse 80   Temp (!) 97.2 F (36.2 C) (Temporal)   Ht 5\' 4"  (1.626 m)   Wt 211 lb 9.6 oz (96 kg)   SpO2 97%   BMI 36.32 kg/m   Gen: No acute distress, resting comfortably CV: Regular rate and rhythm with no murmurs appreciated Pulm: Normal work of breathing, clear to auscultation bilaterally with no crackles, wheezes, or rhonchi Neuro: Grossly normal, moves all extremities Psych: Normal affect and thought content      Aaron Drake M. Jerline Pain, MD 09/15/2020 10:16 AM

## 2020-09-15 NOTE — Patient Instructions (Addendum)
It was very nice to see you today!  We will stop the Cialis.  We are starting a low dose.  Checkup in a couple weeks to let me know if we need to adjust the dose.  Let me know if you have any side effects.  No other changes today.  We will check blood work.  I will see you back in 6 months  Take care, Dr Jerline Pain  Please try these tips to maintain a healthy lifestyle:   Eat at least 3 REAL meals and 1-2 snacks per day.  Aim for no more than 5 hours between eating.  If you eat breakfast, please do so within one hour of getting up.    Each meal should contain half fruits/vegetables, one quarter protein, and one quarter carbs (no bigger than a computer mouse)   Cut down on sweet beverages. This includes juice, soda, and sweet tea.     Drink at least 1 glass of water with each meal and aim for at least 8 glasses per day   Exercise at least 150 minutes every week.

## 2020-09-15 NOTE — Assessment & Plan Note (Signed)
A1c 5.9.  Continue Ozempic 1 mg weekly.  Recheck 6 months.

## 2020-09-16 LAB — PSA: PSA: 0.65 ng/mL (ref <=4.0)

## 2020-09-16 NOTE — Progress Notes (Signed)
Dr Marigene Ehlers interpretation of your lab work:  Good news! Your PSA level is NORMAL.    If you have any additional questions, please give [place] a call or send [place] a message through [place].  Take care, Dr Jerline Pain

## 2020-10-11 ENCOUNTER — Other Ambulatory Visit: Payer: Self-pay | Admitting: Allergy and Immunology

## 2020-10-13 NOTE — Telephone Encounter (Signed)
Courtesy refill sent into the pharmacy.

## 2020-10-22 ENCOUNTER — Encounter: Payer: Self-pay | Admitting: Physician Assistant

## 2020-10-28 ENCOUNTER — Encounter: Payer: Self-pay | Admitting: Allergy and Immunology

## 2020-10-28 ENCOUNTER — Telehealth: Payer: Self-pay

## 2020-10-28 ENCOUNTER — Ambulatory Visit: Payer: 59 | Admitting: Allergy and Immunology

## 2020-10-28 ENCOUNTER — Other Ambulatory Visit: Payer: Self-pay

## 2020-10-28 VITALS — BP 114/62 | HR 83 | Resp 16

## 2020-10-28 DIAGNOSIS — J3089 Other allergic rhinitis: Secondary | ICD-10-CM | POA: Diagnosis not present

## 2020-10-28 DIAGNOSIS — K219 Gastro-esophageal reflux disease without esophagitis: Secondary | ICD-10-CM

## 2020-10-28 DIAGNOSIS — J4550 Severe persistent asthma, uncomplicated: Secondary | ICD-10-CM

## 2020-10-28 DIAGNOSIS — L2089 Other atopic dermatitis: Secondary | ICD-10-CM

## 2020-10-28 NOTE — Patient Instructions (Addendum)
  1. Continue a combination of:   A. Qvar 80 Redihaler - 1-2 inhalations twice a day.   B. montelukast 10 mg daily  C. Dupilumab injections every 2 weeks  D. Omeprazole 20 mg 1 time per day  2. Continue Claritin and ProAir REDIHALER and topical treatment if needed.  3. Increase Qvar to 3 inhalations 3 times per day as part of "action plan" for flareup  4. Return to clinic in 6 months or earlier if problem  5.  Obtain COVID booster vaccine

## 2020-10-28 NOTE — Progress Notes (Signed)
[place] [facility] [place] [facility] [place]   Follow-up Note  Referring Provider: Vivi Barrack, MD Primary Provider: Vivi Barrack, MD Date of Office Visit: 10/28/2020   Subjective:   Aaron Drake (DOB: [birth year]) is a 64 y.o. 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  His last visit to this clinic was 29 April 2020.  He was doing quite well until he contracted Covid in August 2021 requiring hospitalization and remdesivir and systemic steroid administration.  He has not really had any long-term sequela as a result of that infection and is now back to baseline which means that he uses a short acting bronchodilator about twice a day in conjunction with his Qvar and montelukast and dupilumab.  His skin is doing wonderful while he uses dupilumab and he has really had no significant issues and rarely uses any topical agent at this point.  His reflux has been under very good control while using omeprazole.  He has received 2 Pfizer Covid vaccines and the flu vaccine this year.  He will be going on a cruise in the [place] this January.  Allergies as of 10/28/2020      Reactions   Lipitor [atorvastatin] Other (See Comments)   Muscle cramps and joint ache      Medication List      albuterol 108 (90 Base) MCG/ACT inhaler Commonly known as: VENTOLIN HFA INHALE 2 PUFFS INTO THE LUNGS EVERY 4 HOURS AS NEEDED FOR WHEEZE OR FOR SHORTNESS OF BREATH   alfuzosin 10 MG 24 hr tablet Commonly known as: UROXATRAL Take 10 mg by mouth daily with breakfast.   aspirin EC 81 MG tablet Take 1 tablet (81 mg total) by mouth daily.   CALCIUM + D3 PO Take 1 tablet by mouth daily.   CRANBERRY PO Take 2-3 capsules by mouth daily.   Dupixent 300 MG/2ML prefilled syringe Generic  drug: dupilumab INJECT 300 MG SUBCUTANEOUSLY EVERY OTHER WEEK What changed: See the new instructions.   dutasteride 0.5 MG capsule Commonly known as: AVODART Take 1 capsule (0.5 mg total) by mouth daily.   EPINEPHrine 0.3 mg/0.3 mL Soaj injection Commonly known as: EpiPen 2-Pak Inject 0.3 mLs (0.3 mg total) into the muscle as needed for anaphylaxis.   losartan 50 MG tablet Commonly known as: COZAAR TAKE 1/2 TABLET (25MG       TOTAL) DAILY What changed: See the new instructions.   montelukast 10 MG tablet Commonly known as: SINGULAIR TAKE 1 TABLET DAILY What changed: when to take this   multivitamin with minerals Tabs tablet Take 1 tablet by mouth daily.   omeprazole 20 MG capsule Commonly known as: PRILOSEC TAKE 1 CAPSULE DAILY   Ozempic (1 MG/DOSE) 2 MG/1.5ML Sopn Generic drug: Semaglutide (1 MG/DOSE) INJECT 1 MG INTO THE SKIN ONCE A WEEK.   Ozempic (1 MG/DOSE) 4 MG/3ML Sopn Generic drug: Semaglutide (1 MG/DOSE) INJECT 1 MG INTO THE SKIN ONCE A WEEK.   Qvar RediHaler 80 MCG/ACT inhaler Generic drug: beclomethasone USE 2 INHALATIONS ORALLY   TWICE DAILY TO PREVENT     COUGH OR WHEEZE; RINSE,    GARGLE AND SPIT AFTER USE What changed: See the new instructions.   saw palmetto 500 MG capsule Take 500 mg by mouth daily.   tadalafil 5 MG tablet Commonly known as:  Cialis Take 1 tablet (5 mg total) by mouth daily. For BPH.       Past Medical History:  Diagnosis Date   Allergy    Arthritis    Asthma 05/12/2012   BPH (benign prostatic hyperplasia) 05/19/2012   Diabetes mellitus without complication ([place])    Dyspnea    physical stress; exercise    History of kidney stones    Hyperlipidemia    Impaired glucose tolerance 05/19/2012   Tear of meniscus of right knee 2018   partial tear    Past Surgical History:  Procedure Laterality Date   CATARACT EXTRACTION Right    EXTRACORPOREAL SHOCK WAVE LITHOTRIPSY Left 05/08/2018   Procedure: LEFT  EXTRACORPOREAL SHOCK WAVE LITHOTRIPSY (ESWL);  Surgeon: [name], MD;  Location: [name];  Service: Urology;  Laterality: Left;   INSERTION OF MESH N/A 05/17/2016   Procedure: INSERTION OF MESH;  Surgeon: Rolm Bookbinder, MD;  Location: [facility];  Service: General;  Laterality: N/A;   UMBILICAL HERNIA REPAIR N/A 05/17/2016   Procedure: UMBILICAL HERNIA REPAIR;  Surgeon: Rolm Bookbinder, MD;  Location: [facility];  Service: General;  Laterality: N/A;   WISDOM TOOTH EXTRACTION      Review of systems negative except as noted in HPI / PMHx or noted below:  Review of Systems  Constitutional: Negative.   HENT: Negative.   Eyes: Negative.   Respiratory: Negative.   Cardiovascular: Negative.   Gastrointestinal: Negative.   Genitourinary: Negative.   Musculoskeletal: Negative.   Skin: Negative.   Neurological: Negative.   Endo/Heme/Allergies: Negative.   Psychiatric/Behavioral: Negative.      Objective:   Vitals:   10/28/20 1038  BP: 114/62  Pulse: 83  Resp: 16  SpO2: 96%          Physical Exam Constitutional:      Appearance: He is not diaphoretic.  HENT:     Head: Normocephalic.     Right Ear: Tympanic membrane, ear canal and external ear normal.     Left Ear: Tympanic membrane, ear canal and external ear normal.     Nose: Nose normal. No mucosal edema or rhinorrhea.     Mouth/Throat:     Pharynx: Uvula midline. No oropharyngeal exudate.  Eyes:     Conjunctiva/sclera: Conjunctivae normal.  Neck:     Thyroid: No thyromegaly.     Trachea: Trachea normal. No tracheal tenderness or tracheal deviation.  Cardiovascular:     Rate and Rhythm: Normal rate and regular rhythm.     Heart sounds: Normal heart sounds, S1 normal and S2 normal. No murmur heard.   Pulmonary:     Effort: No respiratory distress.     Breath sounds: Normal breath sounds. No stridor. No wheezing or rales.  Lymphadenopathy:     Head:     Right side of head: No tonsillar adenopathy.     Left  side of head: No tonsillar adenopathy.     Cervical: No cervical adenopathy.  Skin:    Findings: No erythema or rash.     Nails: There is no clubbing.  Neurological:     Mental Status: He is alert.     Diagnostics:    Spirometry was performed and demonstrated an FEV1 of 2.04 at 76 % of predicted.  Assessment and Plan:   1. Asthma, severe persistent, well-controlled   2. Other allergic rhinitis   3. Other atopic dermatitis   4. Gastroesophageal reflux disease, unspecified whether esophagitis present     1. Continue a combination  of:   A. Qvar 80 Redihaler - 1-2 inhalations twice a day.   B. montelukast 10 mg daily  C. Dupilumab injections every 2 weeks  D. Omeprazole 20 mg 1 time per day  2. Continue Claritin and ProAir REDIHALER and topical treatment if needed.  3. Increase Qvar to 3 inhalations 3 times per day as part of "action plan" for flareup  4. Return to clinic in 6 months or earlier if problem  5.  Obtain COVID booster vaccine  Overall [name] is really doing quite well on his current therapy which includes dupilumab and some anti-inflammatory agents for his airway and therapy directed against reflux.  Were not really going to change much of his treatment at this point in time and we will see him back in his clinic in 6 months or earlier if there is a problem.  Allena Katz, MD Allergy / Immunology [place]

## 2020-10-28 NOTE — Telephone Encounter (Signed)
..   LAST APPOINTMENT DATE: 09/15/2020   NEXT APPOINTMENT DATE:@4 /18/2022  MEDICATION: tadalafil (CIALIS) 5 MG tablet   PHARMACY: [facility] with [facility]  **Let patient know to contact pharmacy at the end of the day to make sure medication is ready. **  ** Please notify patient to allow 48-72 hours to process**  **Encourage patient to contact the pharmacy for refills or they can request refills through [facility]**  CLINICAL FILLS OUT ALL BELOW:   LAST REFILL:  QTY:  REFILL DATE:    OTHER COMMENTS:    Okay for refill?  Please advise

## 2020-10-28 NOTE — Telephone Encounter (Signed)
Call patient at [phone]. Male stated "not to call this number again or will be persecuted" Pt needs to call pharmacy for refills

## 2020-10-29 ENCOUNTER — Encounter: Payer: Self-pay | Admitting: Allergy and Immunology

## 2020-10-29 ENCOUNTER — Encounter: Payer: Self-pay | Admitting: Family Medicine

## 2020-10-29 ENCOUNTER — Other Ambulatory Visit: Payer: Self-pay

## 2020-10-30 ENCOUNTER — Other Ambulatory Visit: Payer: Self-pay | Admitting: *Deleted

## 2020-10-30 ENCOUNTER — Telehealth: Payer: Self-pay

## 2020-10-30 ENCOUNTER — Encounter: Payer: Self-pay | Admitting: Allergy and Immunology

## 2020-10-30 MED ORDER — TADALAFIL 5 MG PO TABS: 5.0000 mg | ORAL_TABLET | Freq: Every day | ORAL | 5 refills | Status: DC

## 2020-10-30 NOTE — Telephone Encounter (Signed)
Initial Comment Caller states she is calling about a prescription for a patient. She need to clarify the medication order. The nurse can speak with any representative Pharmacy Name [facility] Pharmacist Name [place] Number [phone] Translation No Nurse Assessment Nurse: Marcelline Deist RN, Mickel Baas Date/Time Eilene Ghazi Time): 10/29/2020 6:38:43 PM Please select the assessment type ---Pharmacy clarification Additional Documentation ---Pharmacist Sabrina at [facility] called in regards of a refill request on Cialis 5 mg for the pt. Is there an on-call physician for the client? ---Yes Do the client directives allow paging the on call for medication concerns? ---Yes Document information that requires clarification. ---The pharmacy is wanting the pcp to send in a Rx for the pt for Cialis 5 mg. Disp. Time Eilene Ghazi Time) Disposition Final User 10/29/2020 6:41:23 PM Pharmacy Call Marcelline Deist, RN, Mickel Baas Reason: Spoke to pharmacist at [facility] in regards to a refill for the pt. They stated that they are wanting the pcp to send in a Rx for the pt's Cialis 5 mg. 10/29/2020 6:41:31 PM Clinical Call Yes Marcelline Deist, RN, [place]

## 2020-11-04 NOTE — Telephone Encounter (Signed)
Has this been handled?

## 2020-11-04 NOTE — Telephone Encounter (Signed)
Rx was send  

## 2020-11-07 ENCOUNTER — Encounter: Payer: Self-pay | Admitting: Family Medicine

## 2020-11-07 ENCOUNTER — Other Ambulatory Visit: Payer: Self-pay | Admitting: *Deleted

## 2020-11-07 ENCOUNTER — Other Ambulatory Visit: Payer: Self-pay

## 2020-11-07 DIAGNOSIS — E119 Type 2 diabetes mellitus without complications: Secondary | ICD-10-CM

## 2020-11-07 NOTE — Telephone Encounter (Signed)
Ok to refill?  Last visit 09/15/2020 Last refill 11/24/2019

## 2020-11-10 ENCOUNTER — Other Ambulatory Visit: Payer: Self-pay | Admitting: Family Medicine

## 2020-11-10 ENCOUNTER — Encounter: Payer: Self-pay | Admitting: Family Medicine

## 2020-11-10 ENCOUNTER — Other Ambulatory Visit: Payer: Self-pay | Admitting: Allergy and Immunology

## 2020-11-10 ENCOUNTER — Other Ambulatory Visit: Payer: Self-pay

## 2020-11-10 ENCOUNTER — Other Ambulatory Visit: Payer: Self-pay | Admitting: *Deleted

## 2020-11-10 ENCOUNTER — Encounter: Payer: Self-pay | Admitting: Allergy and Immunology

## 2020-11-10 DIAGNOSIS — E119 Type 2 diabetes mellitus without complications: Secondary | ICD-10-CM

## 2020-11-10 MED ORDER — OMEPRAZOLE 20 MG PO CPDR: 20.0000 mg | DELAYED_RELEASE_CAPSULE | Freq: Every day | ORAL | 1 refills | Status: DC

## 2020-11-10 MED ORDER — ALBUTEROL SULFATE HFA 108 (90 BASE) MCG/ACT IN AERS: INHALATION_SPRAY | RESPIRATORY_TRACT | 3 refills | Status: DC

## 2020-11-10 MED ORDER — OZEMPIC (1 MG/DOSE) 2 MG/1.5ML ~~LOC~~ SOPN: 1.0000 mg | PEN_INJECTOR | SUBCUTANEOUS | 1 refills | Status: DC

## 2020-11-10 NOTE — Telephone Encounter (Signed)
Rx send

## 2020-11-13 ENCOUNTER — Other Ambulatory Visit: Payer: Self-pay | Admitting: *Deleted

## 2020-11-14 ENCOUNTER — Other Ambulatory Visit: Payer: Self-pay | Admitting: *Deleted

## 2020-11-14 DIAGNOSIS — E119 Type 2 diabetes mellitus without complications: Secondary | ICD-10-CM

## 2020-11-14 MED ORDER — OZEMPIC (1 MG/DOSE) 2 MG/1.5ML ~~LOC~~ SOPN: 1.0000 mg | PEN_INJECTOR | SUBCUTANEOUS | 1 refills | Status: DC

## 2020-11-14 NOTE — Telephone Encounter (Signed)
Rx send

## 2020-11-24 ENCOUNTER — Other Ambulatory Visit: Payer: Self-pay | Admitting: Allergy and Immunology

## 2020-12-03 ENCOUNTER — Encounter: Payer: Self-pay | Admitting: Allergy and Immunology

## 2020-12-03 ENCOUNTER — Other Ambulatory Visit: Payer: Self-pay | Admitting: *Deleted

## 2020-12-03 MED ORDER — QVAR REDIHALER 80 MCG/ACT IN AERB: 2.0000 | INHALATION_SPRAY | Freq: Two times a day (BID) | RESPIRATORY_TRACT | 5 refills | Status: DC

## 2020-12-04 ENCOUNTER — Other Ambulatory Visit: Payer: Self-pay | Admitting: *Deleted

## 2020-12-04 MED ORDER — OZEMPIC (1 MG/DOSE) 4 MG/3ML ~~LOC~~ SOPN: PEN_INJECTOR | SUBCUTANEOUS | 1 refills | Status: DC

## 2020-12-08 ENCOUNTER — Other Ambulatory Visit: Payer: Self-pay | Admitting: Allergy and Immunology

## 2020-12-09 ENCOUNTER — Other Ambulatory Visit: Payer: Self-pay | Admitting: Family Medicine

## 2020-12-10 ENCOUNTER — Other Ambulatory Visit: Payer: Self-pay

## 2020-12-10 NOTE — Telephone Encounter (Signed)
I tried to change to Albuterol "PA/PV only gave HFA  options.  Please Advise.

## 2020-12-11 ENCOUNTER — Other Ambulatory Visit: Payer: Self-pay | Admitting: Family Medicine

## 2020-12-12 ENCOUNTER — Other Ambulatory Visit: Payer: Self-pay | Admitting: *Deleted

## 2020-12-12 ENCOUNTER — Encounter: Payer: Self-pay | Admitting: Family Medicine

## 2020-12-12 MED ORDER — ALBUTEROL SULFATE HFA 108 (90 BASE) MCG/ACT IN AERS: INHALATION_SPRAY | RESPIRATORY_TRACT | 3 refills | Status: DC

## 2020-12-12 NOTE — Telephone Encounter (Signed)
Left message to return call to our office at their convenience.  

## 2021-01-03 ENCOUNTER — Other Ambulatory Visit: Payer: Self-pay | Admitting: Urology

## 2021-01-06 ENCOUNTER — Other Ambulatory Visit: Payer: Self-pay | Admitting: Family Medicine

## 2021-01-06 DIAGNOSIS — E119 Type 2 diabetes mellitus without complications: Secondary | ICD-10-CM

## 2021-01-12 ENCOUNTER — Other Ambulatory Visit: Payer: Self-pay | Admitting: *Deleted

## 2021-01-12 MED ORDER — DUTASTERIDE 0.5 MG PO CAPS: 0.5000 mg | ORAL_CAPSULE | Freq: Every day | ORAL | 3 refills | Status: DC

## 2021-02-04 ENCOUNTER — Other Ambulatory Visit: Payer: Self-pay | Admitting: *Deleted

## 2021-02-04 ENCOUNTER — Encounter: Payer: Self-pay | Admitting: Family Medicine

## 2021-02-04 MED ORDER — ALBUTEROL SULFATE HFA 108 (90 BASE) MCG/ACT IN AERS: INHALATION_SPRAY | RESPIRATORY_TRACT | 3 refills | Status: DC

## 2021-02-04 NOTE — Telephone Encounter (Signed)
Rx send to pharmacy  

## 2021-02-05 ENCOUNTER — Other Ambulatory Visit: Payer: Self-pay | Admitting: *Deleted

## 2021-02-05 MED ORDER — ALBUTEROL SULFATE HFA 108 (90 BASE) MCG/ACT IN AERS: INHALATION_SPRAY | RESPIRATORY_TRACT | 3 refills | Status: DC

## 2021-02-20 ENCOUNTER — Other Ambulatory Visit: Payer: Self-pay | Admitting: Family Medicine

## 2021-02-20 NOTE — Telephone Encounter (Signed)
Please advise 

## 2021-03-16 ENCOUNTER — Other Ambulatory Visit: Payer: Self-pay

## 2021-03-16 ENCOUNTER — Ambulatory Visit: Payer: 59 | Admitting: Family Medicine

## 2021-03-16 VITALS — BP 125/74 | HR 91 | Temp 98.0°F | Ht 64.0 in | Wt 209.4 lb

## 2021-03-16 DIAGNOSIS — J45909 Unspecified asthma, uncomplicated: Secondary | ICD-10-CM | POA: Diagnosis not present

## 2021-03-16 DIAGNOSIS — E119 Type 2 diabetes mellitus without complications: Secondary | ICD-10-CM | POA: Diagnosis not present

## 2021-03-16 DIAGNOSIS — R338 Other retention of urine: Secondary | ICD-10-CM

## 2021-03-16 DIAGNOSIS — N401 Enlarged prostate with lower urinary tract symptoms: Secondary | ICD-10-CM | POA: Diagnosis not present

## 2021-03-16 LAB — POCT GLYCOSYLATED HEMOGLOBIN (HGB A1C): Hemoglobin A1C: 5.7 % — AB (ref 4.0–5.6)

## 2021-03-16 MED ORDER — TADALAFIL 5 MG PO TABS: 5.0000 mg | ORAL_TABLET | Freq: Every day | ORAL | 5 refills | Status: DC

## 2021-03-16 NOTE — Assessment & Plan Note (Signed)
Not controlled.  Recommended he increase Qvar to 2 puffs twice daily.  He will continue albuterol.  He will follow-up with his allergist and who is helping manage his asthma.  He is on Singulair 10 mg daily as well.

## 2021-03-16 NOTE — Patient Instructions (Signed)
It was very nice to see you today!  Your A1c looks great.  Please increase your Qvar to 2 puffs twice daily.  I will see back in 6 months for your annual checkup with blood work.  Please come back to see me sooner if needed.  Take care, Dr Jerline Pain  PLEASE NOTE:  If you had any lab tests please let us know if you have not heard back within a few days. You may see your results on mychart before we have a chance to review them but we will give you a call once they are reviewed by [place]. If we ordered any referrals today, please let us know if you have not heard from their office within the next week.   Please try these tips to maintain a healthy lifestyle:   Eat at least 3 REAL meals and 1-2 snacks per day.  Aim for no more than 5 hours between eating.  If you eat breakfast, please do so within one hour of getting up.    Each meal should contain half fruits/vegetables, one quarter protein, and one quarter carbs (no bigger than a computer mouse)   Cut down on sweet beverages. This includes juice, soda, and sweet tea.     Drink at least 1 glass of water with each meal and aim for at least 8 glasses per day   Exercise at least 150 minutes every week.

## 2021-03-16 NOTE — Assessment & Plan Note (Signed)
Following with urology.  Continue Cialis 5 mg daily, tamsulosin, and finasteride per urology.

## 2021-03-16 NOTE — Progress Notes (Signed)
   Aaron Drake is a 65 y.o. male who presents today for an office visit.  Assessment/Plan:  Chronic Problems Addressed Today: BPH (benign prostatic hyperplasia) Following with urology.  Continue Cialis 5 mg daily, tamsulosin, and finasteride per urology.  Asthma Not controlled.  Recommended he increase Qvar to 2 puffs twice daily.  He will continue albuterol.  He will follow-up with his allergist and who is helping manage his asthma.  He is on Singulair 10 mg daily as well.  Type 2 diabetes mellitus without complication, without long-term current use of insulin (HCC) A1c stable at 5.7.  Continue Ozempic 1 mg weekly.  Recheck 6 months.    Subjective:  HPI:  See A/p.         Objective:  Physical Exam: BP 125/74   Pulse 91   Temp 98 F (36.7 C)   Ht 5\' 4"  (1.626 m)   Wt 209 lb 6.1 oz (95 kg)   SpO2 96%   BMI 35.94 kg/m   Gen: No acute distress, resting comfortably CV: Regular rate and rhythm with no murmurs appreciated Pulm: Normal work of breathing, clear to auscultation bilaterally with no crackles, wheezes, or rhonchi Neuro: Grossly normal, moves all extremities Psych: Normal affect and thought content      Dalayna Lauter M. Jerline Pain, MD 03/16/2021 10:54 AM

## 2021-03-16 NOTE — Assessment & Plan Note (Signed)
A1c stable at 5.7.  Continue Ozempic 1 mg weekly.  Recheck 6 months.

## 2021-04-24 ENCOUNTER — Encounter: Payer: Self-pay | Admitting: Allergy and Immunology

## 2021-04-28 ENCOUNTER — Ambulatory Visit: Payer: 59 | Admitting: Allergy and Immunology

## 2021-05-01 ENCOUNTER — Encounter: Payer: Self-pay | Admitting: Family Medicine

## 2021-05-04 MED ORDER — ALBUTEROL SULFATE HFA 108 (90 BASE) MCG/ACT IN AERS: 1.0000 | INHALATION_SPRAY | Freq: Four times a day (QID) | RESPIRATORY_TRACT | 1 refills | Status: DC | PRN

## 2021-05-06 NOTE — Telephone Encounter (Signed)
Call patient unable to contact him, line busy

## 2021-05-09 ENCOUNTER — Encounter: Payer: Self-pay | Admitting: Family Medicine

## 2021-05-11 ENCOUNTER — Other Ambulatory Visit: Payer: Self-pay

## 2021-05-11 MED ORDER — ALBUTEROL SULFATE HFA 108 (90 BASE) MCG/ACT IN AERS: 1.0000 | INHALATION_SPRAY | Freq: Four times a day (QID) | RESPIRATORY_TRACT | 1 refills | Status: DC | PRN

## 2021-05-14 ENCOUNTER — Other Ambulatory Visit: Payer: Self-pay | Admitting: *Deleted

## 2021-05-14 MED ORDER — ALBUTEROL SULFATE HFA 108 (90 BASE) MCG/ACT IN AERS: 1.0000 | INHALATION_SPRAY | Freq: Four times a day (QID) | RESPIRATORY_TRACT | 1 refills | Status: DC | PRN

## 2021-05-14 NOTE — Progress Notes (Signed)
pro

## 2021-05-15 NOTE — Telephone Encounter (Signed)
Spoke with [facility] Stated they received Rx may take 14 hr for processing   Patient notified

## 2021-05-18 NOTE — Telephone Encounter (Signed)
Message is taken care of.

## 2021-05-19 ENCOUNTER — Other Ambulatory Visit: Payer: Self-pay | Admitting: Allergy and Immunology

## 2021-05-19 ENCOUNTER — Other Ambulatory Visit: Payer: Self-pay | Admitting: Family Medicine

## 2021-05-19 DIAGNOSIS — E119 Type 2 diabetes mellitus without complications: Secondary | ICD-10-CM

## 2021-05-27 ENCOUNTER — Other Ambulatory Visit: Payer: Self-pay | Admitting: *Deleted

## 2021-05-27 ENCOUNTER — Encounter: Payer: Self-pay | Admitting: Family Medicine

## 2021-05-27 MED ORDER — PROAIR HFA 108 (90 BASE) MCG/ACT IN AERS: 1.0000 | INHALATION_SPRAY | Freq: Four times a day (QID) | RESPIRATORY_TRACT | 1 refills | Status: DC | PRN

## 2021-05-27 NOTE — Telephone Encounter (Signed)
[facility] Requesting  Rx ProAir New Rx send and approved by pharmacy tech    Patient notified

## 2021-05-28 ENCOUNTER — Other Ambulatory Visit: Payer: Self-pay | Admitting: Family Medicine

## 2021-05-28 NOTE — Telephone Encounter (Signed)
Duplicate request

## 2021-06-30 ENCOUNTER — Ambulatory Visit: Payer: 59 | Admitting: Allergy and Immunology

## 2021-06-30 ENCOUNTER — Encounter: Payer: Self-pay | Admitting: Allergy and Immunology

## 2021-06-30 ENCOUNTER — Other Ambulatory Visit: Payer: Self-pay

## 2021-06-30 VITALS — BP 132/84 | HR 83 | Temp 98.2°F | Resp 18 | Ht 64.0 in | Wt 210.4 lb

## 2021-06-30 DIAGNOSIS — Z13820 Encounter for screening for osteoporosis: Secondary | ICD-10-CM

## 2021-06-30 DIAGNOSIS — L2089 Other atopic dermatitis: Secondary | ICD-10-CM | POA: Diagnosis not present

## 2021-06-30 DIAGNOSIS — J3089 Other allergic rhinitis: Secondary | ICD-10-CM | POA: Diagnosis not present

## 2021-06-30 DIAGNOSIS — K219 Gastro-esophageal reflux disease without esophagitis: Secondary | ICD-10-CM | POA: Diagnosis not present

## 2021-06-30 DIAGNOSIS — J4550 Severe persistent asthma, uncomplicated: Secondary | ICD-10-CM

## 2021-06-30 MED ORDER — AIRDUO DIGIHALER 232-14 MCG/ACT IN AEPB: 1.0000 | INHALATION_SPRAY | Freq: Two times a day (BID) | RESPIRATORY_TRACT | 5 refills | Status: DC

## 2021-06-30 MED ORDER — PROAIR HFA 108 (90 BASE) MCG/ACT IN AERS: 2.0000 | INHALATION_SPRAY | RESPIRATORY_TRACT | 1 refills | Status: DC | PRN

## 2021-06-30 MED ORDER — EPINEPHRINE 0.3 MG/0.3ML IJ SOAJ: 0.3000 mg | INTRAMUSCULAR | 1 refills | Status: DC | PRN

## 2021-06-30 MED ORDER — MONTELUKAST SODIUM 10 MG PO TABS: 10.0000 mg | ORAL_TABLET | Freq: Every day | ORAL | 1 refills | Status: DC

## 2021-06-30 NOTE — Progress Notes (Signed)
[place] [facility] [place] [facility] [place]   Follow-up Note  Referring Provider: Vivi Barrack, MD Primary Provider: Vivi Barrack, MD Date of Office Visit: 06/30/2021  Subjective:   Aaron Drake (DOB: 1956/07/11) is a 65 y.o. 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  His last visit to this clinic was 28 October 2020.  He believes that he has never really recovered since his COVID infection in August 2021 requiring hospitalization and administration of remdesivir.  He has daily wheezing and coughing and must use a bronchodilator multiple times per day.  Fortunately, he has not required a systemic steroid.  He is using Qvar on a consistent basis at 160 mcg twice a day.  He tends to remain away from combination drugs because of his prostatic hypertrophy especially anticholinergic agents.  Overall his nose is doing relatively well.  It does not sound as though he has required a antibiotic to treat an episode of sinusitis.  His skin issue has melted away while using dupilumab.  He discontinued his omeprazole for his reflux therapy.  He did this because he was worried about osteoporosis.  He is now using some type of natural treatment which appears to be working pretty well.  Allergies as of 06/30/2021       Reactions   Lipitor [atorvastatin] Other (See Comments)   Muscle cramps and joint ache        Medication List    CALCIUM + D3 PO Take 1 tablet by mouth daily.   CRANBERRY PO Take 2-3 capsules by mouth daily.   Dupixent 300 MG/2ML prefilled syringe Generic drug: dupilumab INJECT 300 MG SUBCUTANEOUSLY EVERY OTHER WEEK   EPINEPHrine 0.3 mg/0.3 mL Soaj injection Commonly known as: EpiPen 2-Pak Inject 0.3 mLs (0.3 mg total) into the muscle as needed for anaphylaxis.    finasteride 5 MG tablet Commonly known as: PROSCAR Take 5 mg by mouth daily.   losartan 50 MG tablet Commonly known as: COZAAR TAKE 1/2 TABLET ('25MG'$       TOTAL)DAILY   montelukast 10 MG tablet Commonly known as: SINGULAIR TAKE 1 TABLET DAILY   multivitamin with minerals Tabs tablet Take 1 tablet by mouth daily.   Ozempic (1 MG/DOSE) 4 MG/3ML Sopn Generic drug: Semaglutide (1 MG/DOSE) INJECT 1 MG INTO THE SKIN ONCE A WEEK.   ProAir HFA 108 (90 Base) MCG/ACT inhaler Generic drug: albuterol Inhale 1 puff into the lungs every 6 (six) hours as needed for wheezing or shortness of breath.   Qvar RediHaler 80 MCG/ACT inhaler Generic drug: beclomethasone Inhale 2 puffs into the lungs 2 (two) times daily.   tamsulosin 0.4 MG Caps capsule Commonly known as: FLOMAX Take 0.4 mg by mouth daily.        Past Medical History:  Diagnosis Date  . Allergy   . Arthritis   . Asthma 05/12/2012  . BPH (benign prostatic hyperplasia) 05/19/2012  . Diabetes mellitus without complication ([place])   . Dyspnea    physical stress; exercise   . History of kidney stones   . Hyperlipidemia   . Impaired glucose tolerance 05/19/2012  . Tear of meniscus of right knee 2018   partial tear    Past Surgical History:  Procedure Laterality Date  . CATARACT EXTRACTION Right   . EXTRACORPOREAL SHOCK WAVE LITHOTRIPSY Left 05/08/2018  Procedure: LEFT EXTRACORPOREAL SHOCK WAVE LITHOTRIPSY (ESWL);  Surgeon: [name], MD;  Location: [name];  Service: Urology;  Laterality: Left;  . INSERTION OF MESH N/A 05/17/2016   Procedure: INSERTION OF MESH;  Surgeon: Rolm Bookbinder, MD;  Location: [facility];  Service: General;  Laterality: N/A;  . UMBILICAL HERNIA REPAIR N/A 05/17/2016   Procedure: UMBILICAL HERNIA REPAIR;  Surgeon: Rolm Bookbinder, MD;  Location: [facility];  Service: General;  Laterality: N/A;  . WISDOM TOOTH EXTRACTION      Review of systems negative except as noted in HPI / PMHx or noted  below:  Review of Systems  Constitutional: Negative.   HENT: Negative.    Eyes: Negative.   Respiratory: Negative.    Cardiovascular: Negative.   Gastrointestinal: Negative.   Genitourinary: Negative.   Musculoskeletal: Negative.   Skin: Negative.   Neurological: Negative.   Endo/Heme/Allergies: Negative.   Psychiatric/Behavioral: Negative.      Objective:   Vitals:   06/30/21 0841  BP: 132/84  Pulse: 83  Resp: 18  Temp: 98.2 F (36.8 C)  SpO2: 95%   Height: '5\' 4"'$  (162.6 cm)  Weight: 210 lb 6.4 oz (95.4 kg)   Physical Exam Constitutional:      Appearance: He is not diaphoretic.  HENT:     Head: Normocephalic.     Right Ear: Tympanic membrane, ear canal and external ear normal.     Left Ear: Tympanic membrane, ear canal and external ear normal.     Nose: Nose normal. No mucosal edema or rhinorrhea.     Mouth/Throat:     Pharynx: Uvula midline. No oropharyngeal exudate.  Eyes:     Conjunctiva/sclera: Conjunctivae normal.  Neck:     Thyroid: No thyromegaly.     Trachea: Trachea normal. No tracheal tenderness or tracheal deviation.  Cardiovascular:     Rate and Rhythm: Normal rate and regular rhythm.     Heart sounds: Normal heart sounds, S1 normal and S2 normal. No murmur heard. Pulmonary:     Effort: No respiratory distress.     Breath sounds: Normal breath sounds. No stridor. No wheezing or rales.  Lymphadenopathy:     Head:     Right side of head: No tonsillar adenopathy.     Left side of head: No tonsillar adenopathy.     Cervical: No cervical adenopathy.  Skin:    Findings: No erythema or rash.     Nails: There is no clubbing.  Neurological:     Mental Status: He is alert.    Diagnostics:    Spirometry was performed and demonstrated an FEV1 of 1.69 at 63 % of predicted.  Assessment and Plan:   1. Not well controlled severe persistent asthma   2. Other allergic rhinitis   3. Other atopic dermatitis   4. Gastroesophageal reflux disease,  unspecified whether esophagitis present   5. Osteoporosis screening     1. Continue a combination of:   A. AirDuo 232 digihaler - 1 inhalation twice a day.   B. montelukast 10 mg daily  C. Dupilumab injections every 2 weeks  2. Continue Claritin and ProAir digihaler and topical treatment if needed.  3. Obtain Bone density scan for osteoporosis  4. Return to clinic in 4 weeks or earlier if problem. Further treatment???  Aaron Drake will use a controller agent containing long-acting bronchodilator and an inhaled steroid with the use of air duo and we will see how he responds to this agent over the course of the next  4 weeks.  He will continue on his dupilumab for his atopic dermatitis which appears to be working great.  We will evaluate him for possible osteoporosis as that is a concern for him and that concern has made him discontinue his omeprazole for the treatment of reflux.  I will contact him with the results of his bone density scan once it is available for review.  Allena Katz, MD [facility] [place]

## 2021-06-30 NOTE — Patient Instructions (Addendum)
  1. Continue a combination of:   A. AirDuo 232 digihaler - 1 inhalation twice a day.   B. montelukast 10 mg daily  C. Dupilumab injections every 2 weeks  2. Continue Claritin and ProAir digihaler and topical treatment if needed.  3. Obtain Bone density scan for osteoporosis  4. Return to clinic in 4 weeks or earlier if problem. Further treatment???

## 2021-07-01 ENCOUNTER — Encounter: Payer: Self-pay | Admitting: Allergy and Immunology

## 2021-07-02 ENCOUNTER — Other Ambulatory Visit: Payer: Self-pay | Admitting: Allergy and Immunology

## 2021-07-02 NOTE — Telephone Encounter (Signed)
Please help Aaron Drake work through the issue of his medications and insurance coverage.  If he does not have Medicare then the inhaler should be covered with the co-pay assistance program.

## 2021-07-02 NOTE — Telephone Encounter (Signed)
Patient called stating the [place] are not covered by the patients insurance.  Please advise to a change.

## 2021-07-03 ENCOUNTER — Encounter: Payer: Self-pay | Admitting: Allergy and Immunology

## 2021-07-03 NOTE — Telephone Encounter (Signed)
Lm for pt to call us back about these medications

## 2021-07-03 NOTE — Telephone Encounter (Signed)
Will work on pa's for these medications today

## 2021-07-03 NOTE — Telephone Encounter (Signed)
Before pas are done please see my chart note in pts chart

## 2021-07-03 NOTE — Telephone Encounter (Signed)
Can you please run these medications using the copay card and not the insurance? Thank you

## 2021-07-06 NOTE — Telephone Encounter (Signed)
I have lm a message for the drug rep to call me back. I spoke with the pharmacy with proair is $44.99 and digihaler is $500 with savings card and $200 without the card. I also left a message for the pt to call me back so I can keep him updated. I will talk to rep about this when he calls me back.

## 2021-07-08 ENCOUNTER — Other Ambulatory Visit: Payer: Self-pay

## 2021-07-08 ENCOUNTER — Telehealth: Payer: Self-pay

## 2021-07-08 MED ORDER — AIRDUO DIGIHALER 232-14 MCG/ACT IN AEPB: 1.0000 | INHALATION_SPRAY | Freq: Two times a day (BID) | RESPIRATORY_TRACT | 5 refills | Status: DC

## 2021-07-08 NOTE — Telephone Encounter (Signed)
A sample for Airduo 232 is ready in the [place] office waiting for patient to pick it up.

## 2021-07-09 NOTE — Telephone Encounter (Signed)
After speaking with drug rep it is cheaper and easier to send rx to speciality pharmacy I have sent rx to special[facility] pharmacy and informed pt of the change of pharmacies he stated his understanding

## 2021-07-13 NOTE — Telephone Encounter (Signed)
Pt was able to get the airduo 232 from the [facility] that the rep told me to send it too. H was able to get normal proair from [facility] for a affordable price and has it also.

## 2021-07-13 NOTE — Telephone Encounter (Signed)
Can someone please check and make sure the patient has gotten his prescription? Please review Dr Bruna Potter message below   Thanks

## 2021-07-24 NOTE — Telephone Encounter (Signed)
Patient reports that while traveling last week, he used a brand of laundry detergent that he does not normally use.  At the same time he began using air duo 232.  He noticed a rash beginning on Friday in " the spots where my clothes touched my skin".  He denies concomitant cardiopulmonary or gastrointestinal symptoms with this rash.  He reports the rash is itchy and red on his arms, legs, and chest for which he is using hydrocortisone with moderate amount of relief.  He agrees to monitor symptoms and begin a photo journal to document any worsening of symptoms.  He agrees to stop air duo and resume Qvar for the weekend.  He agrees to call the clinic with any worsening symptoms.

## 2021-07-27 NOTE — Telephone Encounter (Signed)
Hi Eric, I spoke with Mckade this morning and he reports the rash is almost gone, however, it remains pruritic and some areas have started pealing, "like a sunburn". He has been using Qvar 80-2 puffs twice a day since Thursday and reports his breathing is "ok, but not as good as with AirDuo 232". Thank you

## 2021-07-27 NOTE — Telephone Encounter (Signed)
Why is Sayyid not getting his air duo inhaler???  How can we rectify this issue???  Can we get him a air duo inhaler this week???  Please inform Marquize about the situation and the plan to rectify the situation

## 2021-07-27 NOTE — Telephone Encounter (Signed)
From when I spoke to him on the phone last week he had the airduo ordered thru the special[facility] pharmacy

## 2021-07-27 NOTE — Telephone Encounter (Signed)
Can you please let this patient know that I have talked with Dr. Neldon Mc and his advice was to continue Qvar as he has been until the rash has cleared and then to stop Qvar and restart AirDuo. If the rash reappears, please stop AirDuo and give a call to the clinic. Thank you

## 2021-07-27 NOTE — Telephone Encounter (Signed)
He was able to get it thru the speciality pharmacy for a reasonable price

## 2021-07-29 NOTE — Telephone Encounter (Signed)
Can you please call this patient and ask about his breathing and how his rash is doing

## 2021-07-30 NOTE — Telephone Encounter (Signed)
Sent pt a Pharmacist, community message about this

## 2021-07-31 NOTE — Telephone Encounter (Signed)
Noted  

## 2021-07-31 NOTE — Telephone Encounter (Signed)
Thank you :)

## 2021-08-06 ENCOUNTER — Ambulatory Visit: Payer: 59 | Admitting: Family

## 2021-08-14 ENCOUNTER — Other Ambulatory Visit: Payer: Self-pay | Admitting: Allergy and Immunology

## 2021-08-20 ENCOUNTER — Other Ambulatory Visit: Payer: Self-pay | Admitting: Allergy and Immunology

## 2021-08-21 LAB — PSA: PSA: 0.26

## 2021-08-25 ENCOUNTER — Ambulatory Visit: Payer: 59

## 2021-08-26 NOTE — Telephone Encounter (Signed)
Please advise to pts message and pictures

## 2021-08-26 NOTE — Telephone Encounter (Signed)
Looking better! Can you please send this to Dr. Neldon Mc? Thank you

## 2021-08-27 ENCOUNTER — Ambulatory Visit (INDEPENDENT_AMBULATORY_CARE_PROVIDER_SITE_OTHER): Payer: 59

## 2021-08-27 ENCOUNTER — Encounter: Payer: Self-pay | Admitting: Family Medicine

## 2021-08-27 ENCOUNTER — Other Ambulatory Visit: Payer: Self-pay

## 2021-08-27 DIAGNOSIS — Z23 Encounter for immunization: Secondary | ICD-10-CM

## 2021-09-05 NOTE — Patient Instructions (Addendum)
Asthma Stop Qvar 80 mcg Start Dulera 100 mcg 1 puff twice a day with spacer to help prevent cough and wheeze Continue ProAir Digihaler 2 puffs 4 to 6 hours as needed for cough, wheeze, tightness in chest, or shortness of breath.  Use this just as needed rather than scheduled Continue Dupixent injections every 2 weeks  Atopic dermatitis. Continue daily moisturizing program Continue Dupixent injections every 2 weeks as above  Allergic rhinitis Continue Claritin 10 mg once a day as needed for runny nose or itching  Gastroesophageal reflux disease Obtain Bone density scan for osteoporosis as ordered by Dr. Neldon Mc at last office visit  We will refer you to ENT, Dr. Benjamine Mola, for tongue discoloration  Return to clinic in 4 to 6 weeks with Dr. Neldon Mc or earlier if problem.

## 2021-09-07 ENCOUNTER — Other Ambulatory Visit: Payer: Self-pay

## 2021-09-07 ENCOUNTER — Other Ambulatory Visit: Payer: Self-pay | Admitting: Family

## 2021-09-07 ENCOUNTER — Telehealth: Payer: Self-pay

## 2021-09-07 ENCOUNTER — Encounter: Payer: Self-pay | Admitting: Family

## 2021-09-07 ENCOUNTER — Ambulatory Visit: Payer: 59 | Admitting: Family

## 2021-09-07 VITALS — BP 124/70 | HR 75 | Temp 98.5°F | Resp 16 | Ht 64.0 in | Wt 209.8 lb

## 2021-09-07 DIAGNOSIS — K143 Hypertrophy of tongue papillae: Secondary | ICD-10-CM | POA: Diagnosis not present

## 2021-09-07 DIAGNOSIS — K219 Gastro-esophageal reflux disease without esophagitis: Secondary | ICD-10-CM

## 2021-09-07 DIAGNOSIS — J3089 Other allergic rhinitis: Secondary | ICD-10-CM | POA: Diagnosis not present

## 2021-09-07 DIAGNOSIS — N401 Enlarged prostate with lower urinary tract symptoms: Secondary | ICD-10-CM

## 2021-09-07 DIAGNOSIS — L2089 Other atopic dermatitis: Secondary | ICD-10-CM

## 2021-09-07 DIAGNOSIS — J4550 Severe persistent asthma, uncomplicated: Secondary | ICD-10-CM | POA: Diagnosis not present

## 2021-09-07 DIAGNOSIS — R338 Other retention of urine: Secondary | ICD-10-CM

## 2021-09-07 MED ORDER — DULERA 100-5 MCG/ACT IN AERO: 1.0000 | INHALATION_SPRAY | Freq: Two times a day (BID) | RESPIRATORY_TRACT | 5 refills | Status: DC

## 2021-09-07 MED ORDER — SPACER/AERO-HOLDING CHAMBERS DEVI: 1.0000 | 0 refills | Status: DC

## 2021-09-07 NOTE — Progress Notes (Signed)
[place]  [id number] Dept: [phone]  FOLLOW UP NOTE  Patient ID: Aaron Drake, male    DOB: [birth year]  Age: 65 y.o. MRN: [id number] Date of Office Visit: 09/07/2021  Assessment  Chief Complaint: Follow-up (4 week )  HPI DSHAWN Drake is a 65 year old male who presents today for follow-up of not well controlled severe persistent asthma, allergic rhinitis, atopic dermatitis, gastroesophageal reflux disease, and osteoporosis screening.  He was last seen on June 30, 2021 by Dr. Neldon Mc.  Severe persistent asthma is reported as not well controlled with Qvar 80 mcg 1 puff twice a day on most days.  With weather changes or humidity he will increase his Qvar 80 mcg to 2 puffs twice a day.  He does not use a spacer with his Qvar.  He reports wheezing on occasion, tightness in his chest on occasion when he is wheezing, and mild shortness of breath with wheezing.  He also has nocturnal awakenings once in a great while.  He denies coughing.  He uses his albuterol every night before he lies down.  He will then use his albuterol additional 1-2 times throughout the day each day.  He has not made any trips to the emergency room or urgent care due to breathing problems since we last saw him and has not been on systemic steroids since we last saw him.  He does not want to restart AirDuo due to it causing the the allover itchy rash and peeling of skin.  He reports that the rash cleared up 3 to 4 days after stopping AirDuo. He then started peeling.  Is only peeling now at the bottom of both feet.  He reports at one point time even the inside of his ear was peeling.  In the past he has tried Advair and did okay with that.  When he used Symbicort in the past he developed a cataract in his right eye almost over night.  He reports that he had a right eye lens implant in 2003 or 2004  Allergic rhinitis is reported as controlled with Claritin 10 mg once a day as needed.  He denies any  rhinorrhea, nasal congestion, and postnasal drip.  He has not had any sinus infections since we last saw him.  Atopic dermatitis is reported as not being a problem with Dupixent injections every 2 weeks.  He denies any reactions due to [place].  He currently does not use any steroid creams and will use a moisturizing body wash.  He puts lotion on his feet only.  Gastroesophageal reflux disease is reported as doing "fine" with 2 Pepto-Bismol before bed each night.  He has not had the bone density scan completed yet.  He reports that he was never called and then he went out of town and forgot.  He reports that he has never noticed the brown discoloration on his tongue.  He quit smoking in 1998 and does not use chewing tobacco products.  He does drink 1 to 2 cups of coffee a day or 1 cup of tea a day.  He denies that his tongue itches or is painful.   Drug Allergies:  Allergies  Allergen Reactions   Airduo Digihaler [Fluticasone-Salmeterol] Other (See Comments)    Redness -Head to toe; skin Peeling - Head to to   Lipitor [Atorvastatin] Other (See Comments)    Muscle cramps and joint ache    Review of Systems: Review of Systems  Constitutional:  Negative for chills  and fever.  HENT:         Denies rhinorrhea, nasal congestion, and postnasal drip  Eyes:        Denies itchy watery eyes  Respiratory:  Positive for shortness of breath and wheezing. Negative for cough.        Reports occasional wheeze, occasional tightness in his chest when wheezing, and mild shortness of breath and wheezing.  He also reports nocturnal awakenings once in a great while.  He denies cough  Cardiovascular:  Negative for chest pain and palpitations.  Gastrointestinal:        Reports reflux depending on what he eats.  Genitourinary:  Positive for frequency.       Reports benign prostatic hypertrophy  Skin:  Negative for itching and rash.  Neurological:  Negative for headaches.  Endo/Heme/Allergies:  Positive for  environmental allergies.    Physical Exam: BP 124/70 (BP Location: Left Arm, Patient Position: Sitting, Cuff Size: Large)   Pulse 75   Temp 98.5 F (36.9 C) (Temporal)   Resp 16   Ht 5\' 4"  (1.626 m)   Wt 209 lb 12.8 oz (95.2 kg)   SpO2 96%   BMI 36.01 kg/m    Physical Exam Constitutional:      Appearance: Normal appearance.  HENT:     Head: Normocephalic and atraumatic.     Comments: Pharynx: brown discoloration noted on tongue. Eyes normal. Ears: unable to see right tympanic membrane due to cerumen. Left ear normal. Nose: bilateral lower turbinates moderately edematous with no drainage noted    Right Ear: Ear canal and external ear normal.     Left Ear: Tympanic membrane, ear canal and external ear normal.     Mouth/Throat:     Mouth: Mucous membranes are moist.     Pharynx: Oropharynx is clear.     Comments: Brown discoloration noted on tongue Eyes:     Conjunctiva/sclera: Conjunctivae normal.  Cardiovascular:     Rate and Rhythm: Regular rhythm.     Heart sounds: Normal heart sounds.  Pulmonary:     Effort: Pulmonary effort is normal.     Breath sounds: Normal breath sounds.     Comments: Lungs clear to auscultation Musculoskeletal:     Cervical back: Neck supple.  Skin:    General: Skin is warm.     Comments: Flaking of skin noted along edges of right foot. No erythema, bleeding, or oozing noted.  Neurological:     Mental Status: He is alert and oriented to person, place, and time.  Psychiatric:        Mood and Affect: Mood normal.        Behavior: Behavior normal.        Thought Content: Thought content normal.        Judgment: Judgment normal.    Diagnostics: FVC 2.35 L, FEV1 1.63 L.  Predicted FVC 3.63 L, predicted FEV1 2.69 L.  Spirometry indicates moderate restriction.  Assessment and Plan: 1. Not well controlled severe persistent asthma   2. Brown tongue   3. Other allergic rhinitis   4. Other atopic dermatitis   5. Gastroesophageal reflux disease,  unspecified whether esophagitis present   6. Benign prostatic hyperplasia with urinary retention     No orders of the defined types were placed in this encounter.   Patient Instructions  Asthma Stop Qvar 80 mcg Start Dulera 100 mcg 1 puff twice a day with spacer to help prevent cough and wheeze Continue ProAir Digihaler 2 puffs  4 to 6 hours as needed for cough, wheeze, tightness in chest, or shortness of breath.  Use this just as needed rather than scheduled Continue Dupixent injections every 2 weeks  Atopic dermatitis. Continue daily moisturizing program Continue Dupixent injections every 2 weeks as above  Allergic rhinitis Continue Claritin 10 mg once a day as needed for runny nose or itching  Gastroesophageal reflux disease Obtain Bone density scan for osteoporosis as ordered by Dr. Neldon Mc at last office visit  We will refer you to ENT, Dr. Benjamine Mola, for tongue discoloration  Return to clinic in 4 to 6 weeks with Dr. Neldon Mc or earlier if problem.       Return in about 4 weeks (around 10/05/2021), or if symptoms worsen or fail to improve.    Thank you for the opportunity to care for this patient.  Please do not hesitate to contact me with questions.  Althea Charon, FNP [facility]

## 2021-09-07 NOTE — Addendum Note (Signed)
Addended by: Tommas Olp B on: 09/07/2021 11:52 AM   Modules accepted: Orders

## 2021-09-08 ENCOUNTER — Other Ambulatory Visit: Payer: Self-pay | Admitting: Family Medicine

## 2021-09-08 DIAGNOSIS — E119 Type 2 diabetes mellitus without complications: Secondary | ICD-10-CM

## 2021-09-11 ENCOUNTER — Other Ambulatory Visit: Payer: Self-pay | Admitting: *Deleted

## 2021-09-11 ENCOUNTER — Encounter: Payer: Self-pay | Admitting: Allergy and Immunology

## 2021-09-11 ENCOUNTER — Telehealth: Payer: Self-pay | Admitting: *Deleted

## 2021-09-11 MED ORDER — DULERA 100-5 MCG/ACT IN AERO: 1.0000 | INHALATION_SPRAY | Freq: Two times a day (BID) | RESPIRATORY_TRACT | 1 refills | Status: DC

## 2021-09-11 NOTE — Telephone Encounter (Signed)
He cannot use Advair or Breo due to fluticasone being in both medications. He had an all over his body itchy rash with AirDuo which also contains fluticasone. He is not able to use Symbicort because he developed a cataract "almost over night" while on Symbicort.   Please do PA for [facility].  Thank you, Althea Charon, FNP

## 2021-09-11 NOTE — Telephone Encounter (Signed)
I spoke with the patient and he stated that [facility] was trying to get in touch with our office in regards to the [facility] inhaler. I called the pharmacy and they stated that the [facility] is not covered and requires a Prior Authorization. They did state that preferred alternatives are Advair Diskus, Breo Ellipta, and Symbicort. Do you want to switch to any of these inhalers or would you like to continue with the PA?

## 2021-09-14 ENCOUNTER — Telehealth: Payer: Self-pay

## 2021-09-14 ENCOUNTER — Other Ambulatory Visit: Payer: Self-pay

## 2021-09-14 MED ORDER — FLUTICASONE-SALMETEROL 100-50 MCG/ACT IN AEPB: 1.0000 | INHALATION_SPRAY | Freq: Two times a day (BID) | RESPIRATORY_TRACT | 5 refills | Status: DC

## 2021-09-14 NOTE — Telephone Encounter (Signed)
Please do as Dr. Neldon Mc advises

## 2021-09-14 NOTE — Telephone Encounter (Signed)
Please advise to dr Lyndon Code note on my chart message

## 2021-09-14 NOTE — Telephone Encounter (Signed)
-----   Message from Althea Charon, [place] sent at 09/07/2021 12:03 PM EDT ----- Refer to Dr. Benjamine Mola, ENT, brown tongue discoloration.

## 2021-09-15 ENCOUNTER — Other Ambulatory Visit: Payer: Self-pay | Admitting: Allergy and Immunology

## 2021-09-16 ENCOUNTER — Other Ambulatory Visit: Payer: Self-pay | Admitting: Allergy and Immunology

## 2021-09-18 ENCOUNTER — Telehealth: Payer: Self-pay

## 2021-09-18 ENCOUNTER — Telehealth: Payer: Self-pay | Admitting: Allergy and Immunology

## 2021-09-18 NOTE — Telephone Encounter (Signed)
[facility] called in regards to the advair that was sent in. Per the previous note  patient can not take Advair or Breo due to fluticasone being in both medications. He had an all over his body itchy rash with AirDuo which also contains fluticasone. He is not able to use Symbicort because he developed a cataract "almost over night" while on Symbicort.   I told the pharmacist that we wanted to do cancel the advair prescription. A PA for Dulera 100-5 MCG/ACT 1 puff twice a day was created by the pharmacist under [place] [place] for previous prescription.   The [employer] med[employer] paperwork is being faxed over to the [place] office.   Key: [id number]

## 2021-09-18 NOTE — Telephone Encounter (Signed)
[facility] given by Dr. Neldon Mc fluticasone-salmeterol (ADVAIR) 100-50 MCG/ACT AEPB [[id number]] contains ingredients that the patient may be allergic to such as sympathomine, Tics, steroids and albuterol please call the  pharmacy  to clarify at [phone] Ref # [id number]

## 2021-09-18 NOTE — Telephone Encounter (Signed)
There is already an open telephone encounter regarding this issue.

## 2021-09-18 NOTE — Telephone Encounter (Signed)
Thank you! Please also route to Dr. Neldon Mc since he wanted the patient to use Advair.

## 2021-09-21 ENCOUNTER — Other Ambulatory Visit: Payer: Self-pay | Admitting: *Deleted

## 2021-09-21 ENCOUNTER — Encounter: Payer: Self-pay | Admitting: Family Medicine

## 2021-09-21 ENCOUNTER — Ambulatory Visit (INDEPENDENT_AMBULATORY_CARE_PROVIDER_SITE_OTHER): Payer: 59 | Admitting: Family Medicine

## 2021-09-21 ENCOUNTER — Other Ambulatory Visit: Payer: Self-pay

## 2021-09-21 VITALS — BP 100/62 | HR 80 | Temp 98.1°F | Ht 64.0 in | Wt 210.6 lb

## 2021-09-21 DIAGNOSIS — N401 Enlarged prostate with lower urinary tract symptoms: Secondary | ICD-10-CM

## 2021-09-21 DIAGNOSIS — Z23 Encounter for immunization: Secondary | ICD-10-CM | POA: Diagnosis not present

## 2021-09-21 DIAGNOSIS — Z0001 Encounter for general adult medical examination with abnormal findings: Secondary | ICD-10-CM | POA: Diagnosis not present

## 2021-09-21 DIAGNOSIS — E1159 Type 2 diabetes mellitus with other circulatory complications: Secondary | ICD-10-CM

## 2021-09-21 DIAGNOSIS — J4550 Severe persistent asthma, uncomplicated: Secondary | ICD-10-CM

## 2021-09-21 DIAGNOSIS — R338 Other retention of urine: Secondary | ICD-10-CM

## 2021-09-21 DIAGNOSIS — I152 Hypertension secondary to endocrine disorders: Secondary | ICD-10-CM

## 2021-09-21 DIAGNOSIS — E119 Type 2 diabetes mellitus without complications: Secondary | ICD-10-CM | POA: Diagnosis not present

## 2021-09-21 DIAGNOSIS — M5432 Sciatica, left side: Secondary | ICD-10-CM

## 2021-09-21 DIAGNOSIS — E1169 Type 2 diabetes mellitus with other specified complication: Secondary | ICD-10-CM

## 2021-09-21 DIAGNOSIS — E785 Hyperlipidemia, unspecified: Secondary | ICD-10-CM

## 2021-09-21 LAB — COMPREHENSIVE METABOLIC PANEL
ALT: 24 U/L (ref 0–53)
AST: 17 U/L (ref 0–37)
Albumin: 4.4 g/dL (ref 3.5–5.2)
Alkaline Phosphatase: 62 U/L (ref 39–117)
BUN: 13 mg/dL (ref 6–23)
CO2: 26 mEq/L (ref 19–32)
Calcium: 9.9 mg/dL (ref 8.4–10.5)
Chloride: 101 mEq/L (ref 96–112)
Creatinine, Ser: 0.78 mg/dL (ref 0.40–1.50)
GFR: 92.63 mL/min (ref >60.00)
Glucose, Bld: 91 mg/dL (ref 70–99)
Potassium: 4.3 mEq/L (ref 3.5–5.1)
Sodium: 135 mEq/L (ref 135–145)
Total Bilirubin: 0.4 mg/dL (ref 0.2–1.2)
Total Protein: 6.9 g/dL (ref 6.0–8.3)

## 2021-09-21 LAB — CBC
HCT: 43.5 % (ref 39.0–52.0)
Hemoglobin: 14.3 g/dL (ref 13.0–17.0)
MCHC: 32.9 g/dL (ref 30.0–36.0)
MCV: 88.7 fl (ref 78.0–100.0)
Platelets: 282 10*3/uL (ref 150.0–400.0)
RBC: 4.91 Mil/uL (ref 4.22–5.81)
RDW: 14 % (ref 11.5–15.5)
WBC: 9.7 10*3/uL (ref 4.0–10.5)

## 2021-09-21 LAB — TSH: TSH: 1.5 u[IU]/mL (ref 0.35–5.50)

## 2021-09-21 LAB — POCT GLYCOSYLATED HEMOGLOBIN (HGB A1C): Hemoglobin A1C: 5.9 % — AB (ref 4.0–5.6)

## 2021-09-21 LAB — LIPID PANEL
Cholesterol: 209 mg/dL — ABNORMAL HIGH (ref 0–200)
HDL: 70.7 mg/dL (ref >39.00)
LDL Cholesterol: 121 mg/dL — ABNORMAL HIGH (ref 0–99)
NonHDL: 138.45
Total CHOL/HDL Ratio: 3
Triglycerides: 86 mg/dL (ref 0.0–149.0)
VLDL: 17.2 mg/dL (ref 0.0–40.0)

## 2021-09-21 MED ORDER — SEMAGLUTIDE (2 MG/DOSE) 8 MG/3ML ~~LOC~~ SOPN: 2.0000 mg | PEN_INJECTOR | SUBCUTANEOUS | 4 refills | Status: DC

## 2021-09-21 NOTE — Assessment & Plan Note (Signed)
Check lipids. He has not been able to tolerate statins in the past.

## 2021-09-21 NOTE — Assessment & Plan Note (Signed)
A1c 5.9.  We will increase Ozempic to 2 mg weekly.  Recheck in 6 months.  He will check with me in a few weeks via MyChart.

## 2021-09-21 NOTE — Assessment & Plan Note (Signed)
Follows with allergist. On adviar. Uses albuterol as needed.

## 2021-09-21 NOTE — Telephone Encounter (Signed)
Dr. Neldon Mc can you please review Diandra's message and provide your recommendation? Thank You.

## 2021-09-21 NOTE — Assessment & Plan Note (Signed)
Low today. Is having some occasional lightheadedness.  We will discontinue losartan.  He will continue monitoring at home and let us know if it is persistently elevated.

## 2021-09-21 NOTE — Progress Notes (Signed)
Chief Complaint:  Aaron Drake is a 65 y.o. male who presents today for his annual comprehensive physical exam.    Assessment/Plan:  Chronic Problems Addressed Today: Hyperlipidemia associated with type 2 diabetes mellitus ([place]) Check lipids. He has not been able to tolerate statins in the past.   BPH (benign prostatic hyperplasia) Follows with urology.  Has not had much improvement with tamsulosin and finasteride.  He will follow-up with them again in a few months.  Left sided sciatica Follows with chiropractor.  Symptoms are overall stable.  Asthma, severe persistent, well-controlled Follows with allergist. On adviar. Uses albuterol as needed.   Hypertension associated with diabetes ([place]) Low today. Is having some occasional lightheadedness.  We will discontinue losartan.  He will continue monitoring at home and let us know if it is persistently elevated.  Type 2 diabetes mellitus without complication, without long-term current use of insulin (HCC) A1c 5.9.  We will increase Ozempic to 2 mg weekly.  Recheck in 6 months.  He will check with me in a few weeks via MyChart.  Morbid obesity (HCC) BMI 36. Will increase ozempic to 2mg  weekly.   Body mass index is 36.15 kg/m. / Obese  BMI Metric Follow Up - 09/21/21 1121       BMI Metric Follow Up-Please document annually   BMI Metric Follow Up Education provided             Preventative Healthcare: Flu shot given today.   Patient Counseling(The following topics were reviewed and/or handout was given):  -Nutrition: Stressed importance of moderation in sodium/caffeine intake, saturated fat and cholesterol, caloric balance, sufficient intake of fresh fruits, vegetables, and fiber.  -Stressed the importance of regular exercise.   -Substance Abuse: Discussed cessation/primary prevention of tobacco, alcohol, or other drug use; driving or other dangerous activities under the influence; availability of treatment for abuse.    -Injury prevention: Discussed safety belts, safety helmets, smoke detector, smoking near bedding or upholstery.   -Sexuality: Discussed sexually transmitted diseases, partner selection, use of condoms, avoidance of unintended pregnancy and contraceptive alternatives.   -Dental health: Discussed importance of regular tooth brushing, flossing, and dental visits.  -Health maintenance and immunizations reviewed. Please refer to Health maintenance section.  Return to care in 1 year for next preventative visit.     Subjective:  HPI:  He has no acute complaints today. See A/p for status of chronic conditions.   Lifestyle Diet: Balanced.  Exercise: Likes to walk.   Depression screen PHQ 2/9 09/21/2021  Decreased Interest 0  Down, Depressed, Hopeless 0  PHQ - 2 Score 0  Altered sleeping -  Tired, decreased energy -  Change in appetite -  Feeling bad or failure about yourself  -  Trouble concentrating -  Moving slowly or fidgety/restless -  Suicidal thoughts -  PHQ-9 Score -  Difficult doing work/chores -   Health Maintenance Due  Topic Date Due   URINE MICROALBUMIN  07/05/2019   FOOT EXAM  10/31/2019   OPHTHALMOLOGY EXAM  12/28/2019   COVID-19 Vaccine (4 - Booster for [place] series) 01/06/2021    ROS: Per HPI, otherwise a complete review of systems was negative.   PMH:  The following were reviewed and entered/updated in epic: Past Medical History:  Diagnosis Date   Allergy    Arthritis    Asthma 05/12/2012   BPH (benign prostatic hyperplasia) 05/19/2012   Diabetes mellitus without complication (HCC)    Dyspnea    physical stress; exercise  History of kidney stones    Hyperlipidemia    Impaired glucose tolerance 05/19/2012   Tear of meniscus of right knee 2018   partial tear   Patient Active Problem List   Diagnosis Date Noted   Left sided sciatica 09/21/2021   Asthma, severe persistent, well-controlled 10/30/2019   Other allergic rhinitis 10/30/2019   Other atopic  dermatitis 10/30/2019   Gastroesophageal reflux disease 10/30/2019   Hypertension associated with diabetes ([place]) 77/82/4235   Erosive oral lichen planus, Dx via Bx, Tx with Dexamethasone rinse, followed by Periodontist 01/28/2019   Primary osteoarthritis of right knee 08/15/2018   Myalgia due to statin 07/04/2018   Renal calculi 06/21/2018   Morbid obesity ([place]) 03/26/2017   Type 2 diabetes mellitus without complication, without long-term current use of insulin ([place]) 03/26/2017   BPH (benign prostatic hyperplasia) 05/19/2012   Hyperlipidemia associated with type 2 diabetes mellitus [facility])    Past Surgical History:  Procedure Laterality Date   CATARACT EXTRACTION Right    EXTRACORPOREAL SHOCK WAVE LITHOTRIPSY Left 05/08/2018   Procedure: LEFT EXTRACORPOREAL SHOCK WAVE LITHOTRIPSY (ESWL);  Surgeon: [name], MD;  Location: [name];  Service: Urology;  Laterality: Left;   INSERTION OF MESH N/A 05/17/2016   Procedure: INSERTION OF MESH;  Surgeon: Rolm Bookbinder, MD;  Location: [place];  Service: General;  Laterality: N/A;   UMBILICAL HERNIA REPAIR N/A 05/17/2016   Procedure: UMBILICAL HERNIA REPAIR;  Surgeon: Rolm Bookbinder, MD;  Location: [place];  Service: General;  Laterality: N/A;   WISDOM TOOTH EXTRACTION      Family History  Problem Relation Age of Onset   Heart disease Mother    Lung cancer Mother    Stroke Father    COPD Father        Smoker   Aortic aneurysm Father    Colon cancer Maternal Uncle    Medications- reviewed and updated Current Outpatient Medications  Medication Sig Dispense Refill   albuterol (VENTOLIN HFA) 108 (90 Base) MCG/ACT inhaler INHALE 2 PUFFS INTO THE LUNGS EVERY 4 HOURS AS NEEDED FOR WHEEZING OR SHORTNESS OF BREATH. 8.5 each 1   Calcium Carb-Cholecalciferol (CALCIUM + D3 PO) Take 1 tablet by mouth daily.     CRANBERRY PO Take 2-3 capsules by mouth daily.     DUPIXENT 300 MG/2ML prefilled syringe INJECT 300 MG SUBCUTANEOUSLY EVERY OTHER WEEK 4  mL 10   dutasteride (AVODART) 0.5 MG capsule Take 0.5 mg by mouth daily.     EPINEPHrine (EPIPEN 2-PAK) 0.3 mg/0.3 mL IJ SOAJ injection Inject 0.3 mg into the muscle as needed for anaphylaxis. 1 each 1   finasteride (PROSCAR) 5 MG tablet Take 5 mg by mouth daily.     fluticasone-salmeterol (ADVAIR) 100-50 MCG/ACT AEPB Inhale 1 puff into the lungs 2 (two) times daily. 60 each 5   montelukast (SINGULAIR) 10 MG tablet TAKE 1 TABLET DAILY 90 tablet 0   Multiple Vitamin (MULTIVITAMIN WITH MINERALS) TABS tablet Take 1 tablet by mouth daily.     omeprazole (PRILOSEC) 20 MG capsule Take 1 capsule (20 mg total) by mouth daily. 90 capsule 1   Semaglutide, 2 MG/DOSE, 8 MG/3ML SOPN Inject 2 mg as directed once a week. 3 mL 4   Spacer/Aero-Holding Chambers DEVI Take 1 each by mouth as directed. Rinse mouth after each use. 1 each 0   tamsulosin (FLOMAX) 0.4 MG CAPS capsule Take 0.4 mg by mouth 2 (two) times daily.     No current facility-administered medications for this visit.  Allergies-reviewed and updated Allergies  Allergen Reactions   Airduo Digihaler [Fluticasone-Salmeterol] Other (See Comments)    Redness -Head to toe; skin Peeling - Head to to   Lipitor [Atorvastatin] Other (See Comments)    Muscle cramps and joint ache    Social History   Socioeconomic History   Marital status: Single    Spouse name: Not on file   Number of children: Not on file   Years of education: Not on file   Highest education level: Not on file  Occupational History   Not on file  Tobacco Use   Smoking status: Former   Smokeless tobacco: Never  Vaping Use   Vaping Use: Never used  Substance and Sexual Activity   Alcohol use: Yes    Alcohol/week: 1.0 standard drink    Types: 1 Shots of liquor per week    Comment: rare   Drug use: No   Sexual activity: Not Currently  Other Topics Concern   Not on file  Social History Narrative   Not on file   Social Determinants of Health   Financial Resource  Strain: Not on file  Food Insecurity: Not on file  Transportation Needs: Not on file  Physical Activity: Not on file  Stress: Not on file  Social Connections: Not on file        Objective:  Physical Exam: BP 100/62   Pulse 80   Temp 98.1 F (36.7 C) (Temporal)   Ht 5\' 4"  (1.626 m)   Wt 210 lb 9.6 oz (95.5 kg)   SpO2 92%   BMI 36.15 kg/m   Body mass index is 36.15 kg/m. Wt Readings from Last 3 Encounters:  09/21/21 210 lb 9.6 oz (95.5 kg)  09/07/21 209 lb 12.8 oz (95.2 kg)  06/30/21 210 lb 6.4 oz (95.4 kg)   Gen: NAD, resting comfortably HEENT: TMs normal bilaterally. OP clear. No thyromegaly noted.  CV: RRR with no murmurs appreciated Pulm: NWOB, CTAB with no crackles, wheezes, or rhonchi GI: Normal bowel sounds present. Soft, Nontender, Nondistended. MSK: no edema, cyanosis, or clubbing noted Skin: warm, dry Neuro: CN2-12 grossly intact. Strength 5/5 in upper and lower extremities. Reflexes symmetric and intact bilaterally.  Psych: Normal affect and thought content     Benjamim Harnish M. Jerline Pain, MD 09/21/2021 11:22 AM

## 2021-09-21 NOTE — Assessment & Plan Note (Signed)
Follows with chiropractor.  Symptoms are overall stable.

## 2021-09-21 NOTE — Assessment & Plan Note (Signed)
BMI 36. Will increase ozempic to 2mg  weekly.

## 2021-09-21 NOTE — Telephone Encounter (Signed)
I called and spoke with the patient and he stated that he never had any issues with taking Advair and that [facility] is actually mailing that medication to his home now. So that has been taken care of. He did stated that he is getting the bone density scan next week and he cannot see Dr. Benjamine Mola until late December. He is wondering if he needs to see you after the bone density scan or wait until after he sees Dr. Benjamine Mola to come back in and see you?

## 2021-09-21 NOTE — Assessment & Plan Note (Signed)
Follows with urology.  Has not had much improvement with tamsulosin and finasteride.  He will follow-up with them again in a few months.

## 2021-09-21 NOTE — Telephone Encounter (Signed)
So it is my understanding from the messages that we are awaiting approval for [facility], correct?  If so please inform [name] that this is her plan.

## 2021-09-21 NOTE — Patient Instructions (Signed)
It was very nice to see you today!  We will Give your pneumonia vaccine today.  We will also check blood work.  Please stop your losartan.  Keep an eye on your blood pressure and let me know if it is persistently elevated to 150/90 or higher.  We will increase your dose of Ozempic to 2 mg weekly.  Please send me a message in a few weeks to let me know how this is working for you.  We will see you back in 6 months.  Come back sooner if needed.  Take care, Dr Jerline Pain  PLEASE NOTE:  If you had any lab tests please let us know if you have not heard back within a few days. You may see your results on mychart before we have a chance to review them but we will give you a call once they are reviewed by [place]. If we ordered any referrals today, please let us know if you have not heard from their office within the next week.   Please try these tips to maintain a healthy lifestyle:  Eat at least 3 REAL meals and 1-2 snacks per day.  Aim for no more than 5 hours between eating.  If you eat breakfast, please do so within one hour of getting up.   Each meal should contain half fruits/vegetables, one quarter protein, and one quarter carbs (no bigger than a computer mouse)  Cut down on sweet beverages. This includes juice, soda, and sweet tea.   Drink at least 1 glass of water with each meal and aim for at least 8 glasses per day  Exercise at least 150 minutes every week.    Preventive Care 66 Years and Older, Male Preventive care refers to lifestyle choices and visits with your health care provider that can promote health and wellness. This includes: A yearly physical exam. This is also called an annual wellness visit. Regular dental and eye exams. Immunizations. Screening for certain conditions. Healthy lifestyle choices, such as: Eating a healthy diet. Getting regular exercise. Not using drugs or products that contain nicotine and tobacco. Limiting alcohol use. What can I expect for my  preventive care visit? Physical exam Your health care provider will check your: Height and weight. These may be used to calculate your BMI (body mass index). BMI is a measurement that tells if you are at a healthy weight. Heart rate and blood pressure. Body temperature. Skin for abnormal spots. Counseling Your health care provider may ask you questions about your: Past medical problems. Family's medical history. Alcohol, tobacco, and drug use. Emotional well-being. Home life and relationship well-being. Sexual activity. Diet, exercise, and sleep habits. History of falls. Memory and ability to understand (cognition). Work and work Statistician. Access to firearms. What immunizations do I need? Vaccines are usually given at various ages, according to a schedule. Your health care provider will recommend vaccines for you based on your age, medical history, and lifestyle or other factors, such as travel or where you work. What tests do I need? Blood tests Lipid and cholesterol levels. These may be checked every 5 years, or more often depending on your overall health. Hepatitis C test. Hepatitis B test. Screening Lung cancer screening. You may have this screening every year starting at age 16 if you have a 30-pack-year history of smoking and currently smoke or have quit within the past 15 years. Colorectal cancer screening. All adults should have this screening starting at age 58 and continuing until age 49. Your  health care provider may recommend screening at age 41 if you are at increased risk. You will have tests every 1-10 years, depending on your results and the type of screening test. Prostate cancer screening. Recommendations will vary depending on your family history and other risks. Genital exam to check for testicular cancer or hernias. Diabetes screening. This is done by checking your blood sugar (glucose) after you have not eaten for a while (fasting). You may have this done  every 1-3 years. Abdominal aortic aneurysm (AAA) screening. You may need this if you are a current or former smoker. STD (sexually transmitted disease) testing, if you are at risk. Follow these instructions at home: Eating and drinking  Eat a diet that includes fresh fruits and vegetables, whole grains, lean protein, and low-fat dairy products. Limit your intake of foods with high amounts of sugar, saturated fats, and salt. Take vitamin and mineral supplements as recommended by your health care provider. Do not drink alcohol if your health care provider tells you not to drink. If you drink alcohol: Limit how much you have to 0-2 drinks a day. Be aware of how much alcohol is in your drink. In the [place] one drink equals one 12 oz bottle of beer (355 mL), one 5 oz glass of wine (148 mL), or one 1 oz glass of hard liquor (44 mL). Lifestyle Take daily care of your teeth and gums. Brush your teeth every morning and night with fluoride toothpaste. Floss one time each day. Stay active. Exercise for at least 30 minutes 5 or more days each week. Do not use any products that contain nicotine or tobacco, such as cigarettes, e-cigarettes, and chewing tobacco. If you need help quitting, ask your health care provider. Do not use drugs. If you are sexually active, practice safe sex. Use a condom or other form of protection to prevent STIs (sexually transmitted infections). Talk with your health care provider about taking a low-dose aspirin or statin. Find healthy ways to cope with stress, such as: Meditation, yoga, or listening to music. Journaling. Talking to a trusted person. Spending time with friends and family. Safety Always wear your seat belt while driving or riding in a vehicle. Do not drive: If you have been drinking alcohol. Do not ride with someone who has been drinking. When you are tired or distracted. While texting. Wear a helmet and other protective equipment during sports  activities. If you have firearms in your house, make sure you follow all gun safety procedures. What's next? Visit your health care provider once a year for an annual wellness visit. Ask your health care provider how often you should have your eyes and teeth checked. Stay up to date on all vaccines. This information is not intended to replace advice given to you by your health care provider. Make sure you discuss any questions you have with your health care provider. Document Revised: 01/23/2021 Document Reviewed: 11/09/2018 Elsevier Patient Education  2022 [employer].

## 2021-09-22 NOTE — Progress Notes (Signed)
Please inform patient of the following:  His cholesterol is borderline elevated but everything else is NORMAL. Ideally we should try to get his LDL to less than 100. I know he has had issues with lipitor in the past. We can try a milder statin if he wishes to lower his LDL and decrease his risk of heart attack and stroke.  Please send in simvastatin 20mg  daily if he is willing to start.  Regardless, he should continue to work on diet and exercise and we can recheck in a year.  Algis Greenhouse. Jerline Pain, MD 09/22/2021 8:14 AM

## 2021-09-23 ENCOUNTER — Telehealth: Payer: Self-pay

## 2021-09-23 ENCOUNTER — Encounter: Payer: Self-pay | Admitting: Family Medicine

## 2021-09-23 NOTE — Telephone Encounter (Signed)
Aaron Drake from [place] called stating that Ozempic is on back order. Aaron Drake would like an alternative if possible. She can be reached at [phone]. Please Advise.

## 2021-09-27 ENCOUNTER — Encounter: Payer: Self-pay | Admitting: Allergy and Immunology

## 2021-09-28 ENCOUNTER — Other Ambulatory Visit: Payer: Self-pay | Admitting: *Deleted

## 2021-09-28 MED ORDER — SEMAGLUTIDE (2 MG/DOSE) 8 MG/3ML ~~LOC~~ SOPN: 2.0000 mg | PEN_INJECTOR | SUBCUTANEOUS | 4 refills | Status: DC

## 2021-09-28 MED ORDER — FLUTICASONE-SALMETEROL 100-50 MCG/ACT IN AEPB: 1.0000 | INHALATION_SPRAY | Freq: Two times a day (BID) | RESPIRATORY_TRACT | 1 refills | Status: DC

## 2021-09-28 NOTE — Telephone Encounter (Signed)
[place] with patient  [facility] did not recived Rx Ozempic Rx resend to pharmacy today

## 2021-09-29 ENCOUNTER — Other Ambulatory Visit: Payer: Self-pay | Admitting: Family Medicine

## 2021-10-01 ENCOUNTER — Other Ambulatory Visit: Payer: Self-pay | Admitting: *Deleted

## 2021-10-01 MED ORDER — SEMAGLUTIDE (1 MG/DOSE) 4 MG/3ML ~~LOC~~ SOPN: 2.0000 mg | PEN_INJECTOR | SUBCUTANEOUS | 3 refills | Status: DC

## 2021-10-01 NOTE — Telephone Encounter (Incomplete)
Patient's pharmacy called

## 2021-10-01 NOTE — Telephone Encounter (Signed)
I sent in separate prescriptions for two separate 1mg  injections yesterday.  Algis Greenhouse. Jerline Pain, MD 10/01/2021 8:09 AM

## 2021-10-01 NOTE — Telephone Encounter (Signed)
Rx resend to [facility]

## 2021-10-08 ENCOUNTER — Ambulatory Visit
Admission: RE | Admit: 2021-10-08 | Discharge: 2021-10-08 | Disposition: A | Discharge status: Home / Self Care | Payer: 59 | Source: Ambulatory Visit | Attending: Allergy and Immunology | Admitting: Allergy and Immunology

## 2021-10-08 ENCOUNTER — Other Ambulatory Visit: Payer: Self-pay

## 2021-10-13 ENCOUNTER — Telehealth: Payer: Self-pay

## 2021-10-13 NOTE — Telephone Encounter (Signed)
Pt called returning a call for [facility]. He stated that she left a message about medication. He stated that he is not interested in the medication.

## 2021-10-26 NOTE — Telephone Encounter (Signed)
error 

## 2021-11-06 NOTE — Patient Instructions (Addendum)
Asthma  Stop  Qvar 80 mcg We will try and see if we can get Advair 115/21 mcg 2 puffs twice a day with spacer to help prevent cough and wheeze approved. Let us know if you are not able to get this medication and we will try for something else. Continue ProAir D 2 puffs 4 to 6 hours as needed for cough, wheeze, tightness in chest, or shortness of breath.   Continue Dupixent injections every 2 weeks  Atopic dermatitis. Continue daily moisturizing program Continue Dupixent injections every 2 weeks as above  Allergic rhinitis Continue Claritin 10 mg once a day as needed for runny nose or itching  Gastroesophageal reflux disease Continue omeprazole once a day. Continue dietary and lifestyle modifications as below.  Epistaxis Pinch both nostrils while leaning forward for at least 5 minutes before checking to see if the bleeding has stopped. If bleeding is not controlled within 5-10 minutes apply a cotton ball soaked with oxymetazoline (Afrin) to the bleeding nostril for a few seconds.  If the problem persists or worsens a referral to ENT for further evaluation may be necessary.    Return to clinic in 3 months  or earlier if problem.   Lifestyle Changes for Controlling GERD When you have GERD, stomach acid feels as if it's backing up toward your mouth. Whether or not you take medication to control your GERD, your symptoms can often be improved with lifestyle changes.   Raise Your Head Reflux is more likely to strike when you're lying down flat, because stomach fluid can flow backward more easily. Raising the head of your bed 4-6 inches can help. To do this: Slide blocks or books under the legs at the head of your bed. Or, place a wedge under the mattress. Many foam stores can make a suitable wedge for you. The wedge should run from your waist to the top of your head. Don't just prop your head on several pillows. This increases pressure on your stomach. It can make GERD worse.  Watch  Your Eating Habits Certain foods may increase the acid in your stomach or relax the lower esophageal sphincter, making GERD more likely. It's best to avoid the following: Coffee, tea, and carbonated drinks (with and without caffeine) Fatty, fried, or spicy food Mint, chocolate, onions, and tomatoes Any other foods that seem to irritate your stomach or cause you pain  Relieve the Pressure Eat smaller meals, even if you have to eat more often. Don't lie down right after you eat. Wait a few hours for your stomach to empty. Avoid tight belts and tight-fitting clothes. Lose excess weight.  Tobacco and Alcohol Avoid smoking tobacco and drinking alcohol. They can make GERD symptoms worse.

## 2021-11-09 ENCOUNTER — Other Ambulatory Visit: Payer: Self-pay

## 2021-11-09 ENCOUNTER — Encounter: Payer: Self-pay | Admitting: Family

## 2021-11-09 ENCOUNTER — Other Ambulatory Visit: Payer: Self-pay | Admitting: Allergy and Immunology

## 2021-11-09 ENCOUNTER — Ambulatory Visit (INDEPENDENT_AMBULATORY_CARE_PROVIDER_SITE_OTHER): Payer: 59 | Admitting: Family

## 2021-11-09 VITALS — BP 128/72 | HR 93 | Temp 98.0°F | Resp 18 | Ht 64.0 in | Wt 212.2 lb

## 2021-11-09 DIAGNOSIS — R338 Other retention of urine: Secondary | ICD-10-CM

## 2021-11-09 DIAGNOSIS — N401 Enlarged prostate with lower urinary tract symptoms: Secondary | ICD-10-CM

## 2021-11-09 DIAGNOSIS — J3089 Other allergic rhinitis: Secondary | ICD-10-CM

## 2021-11-09 DIAGNOSIS — K219 Gastro-esophageal reflux disease without esophagitis: Secondary | ICD-10-CM | POA: Diagnosis not present

## 2021-11-09 DIAGNOSIS — J4550 Severe persistent asthma, uncomplicated: Secondary | ICD-10-CM | POA: Diagnosis not present

## 2021-11-09 DIAGNOSIS — L2089 Other atopic dermatitis: Secondary | ICD-10-CM

## 2021-11-09 DIAGNOSIS — R040 Epistaxis: Secondary | ICD-10-CM

## 2021-11-09 MED ORDER — ALBUTEROL SULFATE HFA 108 (90 BASE) MCG/ACT IN AERS: 2.0000 | INHALATION_SPRAY | Freq: Four times a day (QID) | RESPIRATORY_TRACT | 1 refills | Status: DC | PRN

## 2021-11-09 MED ORDER — ADVAIR HFA 115-21 MCG/ACT IN AERO: 2.0000 | INHALATION_SPRAY | Freq: Two times a day (BID) | RESPIRATORY_TRACT | 3 refills | Status: DC

## 2021-11-09 NOTE — Progress Notes (Signed)
Aaron Drake  [id number] Dept: [phone]  FOLLOW UP NOTE  Patient ID: Aaron Drake, male    DOB: 1956/04/16  Age: 65 y.o. MRN: [id number] Date of Office Visit: 11/09/2021  Assessment  Chief Complaint: Medication Management (Medication change from Qvar to Advair  - Patient states he became very hoarse while on Advair, no affect. Patient states he switched back to Qvar after being on Advair for about a month with no success.)  HPI Aaron Drake is a 65 year old male who presents today for follow-up of not well controlled severe persistent asthma,brow tongue, allergic rhinitis, atopic dermatitis, gastroesophageal reflux disease, and benign prostatic hyperplasia.  Since his last office visit he denies any new diagnoses or surgeries.  Severe persistent asthma is reported as not well controlled with Qvar 80 mcg 2 puffs twice a day, ProAir as needed, and Dupixent injections every 2 weeks.  He reports that for approximately a month he tried using Advair Diskus and felt that his breathing was better, but he noticed that his voice was getting extremely hoarse.  He stopped using the Advair Diskus and switched back to Qvar 80 mcg 2 puffs twice a day.  He has noticed that since he is using the Qvar 80 mcg that he is having to use his albuterol a little more often.  He reports wheezing in the morning or with quick changes in weather.  He also reports mild shortness of breath.  He denies coughing, tightness in his chest, nocturnal awakenings, fever, and chills.  Since his last office visit he has not required any systemic steroids or made any trips to the emergency room or urgent care due to breathing problems.  He uses his albuterol inhaler approximately 1 puff 4 times a day.  Since changing back to Qvar 80 mcg he still has some hoarseness, but is not as bad as what it was with the Advair Diskus.  Discussed that he could go over this with the ENT on his appointment on December 28.  Atopic  dermatitis is reported as controlled with Dupixent injections every 2 weeks.  He denies any eczematous lesions or rash.  He reports during this time of the year his feet get dry and he will use the cheapest drugstore lotion available.  Dupixent injections have been helping and he denies any problems.  His next Dupixent injection is due this Wednesday.  Allergic rhinitis is reported as controlled with Claritin 10 mg once a day as needed.  He reports once every 3 to 4 weeks he will have a nosebleed out of his left nostril.  He does not use any nose sprays.  He denies rhinorrhea, nasal congestion, and postnasal drip.  He has not had any sinus infections since we last saw him.  He mentions that he is going to keep his appointment with ENT that he has on December 28 due to the nose bleeds that he has out of his left nostril.  Gastroesophageal reflux disease is reported as controlled.  He reports that he is no longer taking Pepto-Bismol and started taking omeprazole approximately 2-1/2 weeks ago.  He reports that his DEXA scan was normal and that his reflux symptoms came back overnight so he started back on the omeprazole once a day.   Drug Allergies:  Allergies  Allergen Reactions   Airduo Digihaler [Fluticasone-Salmeterol] Other (See Comments)    Redness -Head to toe; skin Peeling - Head to to   Lipitor [Atorvastatin] Other (See Comments)  Muscle cramps and joint ache    Review of Systems: Review of Systems  Constitutional:  Negative for chills and fever.  HENT:         Reports a nosebleed out of his left nostril every 3 to 4 weeks.  Denies rhinorrhea, nasal congestion, and postnasal drip.  Eyes:        Reports occasional itchy eyes  Respiratory:  Positive for shortness of breath and wheezing. Negative for cough.        Reports mild shortness of breath and wheezing in the morning or with quick changes in weather.  Denies coughing, tightness in his chest, and nocturnal awakenings   Cardiovascular:  Negative for chest pain and palpitations.  Gastrointestinal:        Denies heartburn or reflux symptoms since starting omeprazole approximately 2-1/2 weeks ago  Genitourinary:  Positive for frequency.       Reports benign prostatic hypertrophy  Skin:  Negative for itching and rash.  Neurological:  Negative for headaches.  Endo/Heme/Allergies:  Positive for environmental allergies.    Physical Exam: BP 128/72   Pulse 93   Temp 98 F (36.7 C)   Resp 18   Ht 5\' 4"  (1.626 m)   Wt 212 lb 3.2 oz (96.3 kg)   SpO2 96%   BMI 36.42 kg/m    Physical Exam Constitutional:      Appearance: Normal appearance.  HENT:     Head: Normocephalic and atraumatic.     Comments: Pharynx normal, eyes normal, ears: Unable to see right  and left tympanic membrane due to cerumen.   Nose bilateral lower turbinates moderately edematous and slightly erythematous with clear drainage noted.  Left turbinate greater than right turbinate    Right Ear: Ear canal and external ear normal.     Left Ear: Ear canal and external ear normal.     Mouth/Throat:     Mouth: Mucous membranes are moist.     Pharynx: Oropharynx is clear.     Comments: No brown discoloration noted on tongue Eyes:     Conjunctiva/sclera: Conjunctivae normal.  Cardiovascular:     Rate and Rhythm: Regular rhythm.     Heart sounds: Normal heart sounds.  Pulmonary:     Effort: Pulmonary effort is normal.     Breath sounds: Normal breath sounds.     Comments: Lungs clear to auscultation Musculoskeletal:     Cervical back: Neck supple.  Skin:    General: Skin is warm.     Comments: No rashes or eczematous lesions noted  Neurological:     Mental Status: He is alert and oriented to person, place, and time.  Psychiatric:        Mood and Affect: Mood normal.        Behavior: Behavior normal.        Thought Content: Thought content normal.        Judgment: Judgment normal.    Diagnostics: FVC 2.71 L, FEV1 1.63 L.   Predicted FVC 3.51 L, predicted FEV1 2.72 L.  Spirometry indicates possible moderate obstruction  Assessment and Plan: 1. Not well controlled severe persistent asthma   2. Other atopic dermatitis   3. Other allergic rhinitis   4. Gastroesophageal reflux disease, unspecified whether esophagitis present   5. Benign prostatic hyperplasia with urinary retention   6. Epistaxis     Meds ordered this encounter  Medications   albuterol (PROAIR HFA) 108 (90 Base) MCG/ACT inhaler    Sig: Inhale 2 puffs  into the lungs every 6 (six) hours as needed for wheezing or shortness of breath.    Dispense:  18 g    Refill:  1   fluticasone-salmeterol (ADVAIR HFA) 115-21 MCG/ACT inhaler    Sig: Inhale 2 puffs into the lungs 2 (two) times daily.    Dispense:  1 each    Refill:  3     Patient Instructions  Asthma  Stop  Qvar 80 mcg We will try and see if we can get Advair 115/21 mcg 2 puffs twice a day with spacer to help prevent cough and wheeze approved. Let us know if you are not able to get this medication and we will try for something else. Continue ProAir D 2 puffs 4 to 6 hours as needed for cough, wheeze, tightness in chest, or shortness of breath.   Continue Dupixent injections every 2 weeks  Atopic dermatitis. Continue daily moisturizing program Continue Dupixent injections every 2 weeks as above  Allergic rhinitis Continue Claritin 10 mg once a day as needed for runny nose or itching  Gastroesophageal reflux disease Continue omeprazole once a day. Continue dietary and lifestyle modifications as below.  Epistaxis Pinch both nostrils while leaning forward for at least 5 minutes before checking to see if the bleeding has stopped. If bleeding is not controlled within 5-10 minutes apply a cotton ball soaked with oxymetazoline (Afrin) to the bleeding nostril for a few seconds.  If the problem persists or worsens a referral to ENT for further evaluation may be necessary.    Return to clinic  in 3 months  or earlier if problem.   Lifestyle Changes for Controlling GERD When you have GERD, stomach acid feels as if it's backing up toward your mouth. Whether or not you take medication to control your GERD, your symptoms can often be improved with lifestyle changes.   Raise Your Head Reflux is more likely to strike when you're lying down flat, because stomach fluid can flow backward more easily. Raising the head of your bed 4-6 inches can help. To do this: Slide blocks or books under the legs at the head of your bed. Or, place a wedge under the mattress. Many foam stores can make a suitable wedge for you. The wedge should run from your waist to the top of your head. Don't just prop your head on several pillows. This increases pressure on your stomach. It can make GERD worse.  Watch Your Eating Habits Certain foods may increase the acid in your stomach or relax the lower esophageal sphincter, making GERD more likely. It's best to avoid the following: Coffee, tea, and carbonated drinks (with and without caffeine) Fatty, fried, or spicy food Mint, chocolate, onions, and tomatoes Any other foods that seem to irritate your stomach or cause you pain  Relieve the Pressure Eat smaller meals, even if you have to eat more often. Don't lie down right after you eat. Wait a few hours for your stomach to empty. Avoid tight belts and tight-fitting clothes. Lose excess weight.  Tobacco and Alcohol Avoid smoking tobacco and drinking alcohol. They can make GERD symptoms worse.        Return in about 3 months (around 02/07/2022), or if symptoms worsen or fail to improve.    Thank you for the opportunity to care for this patient.  Please do not hesitate to contact me with questions.  Althea Charon, FNP [facility]

## 2021-11-10 ENCOUNTER — Other Ambulatory Visit: Payer: Self-pay | Admitting: Family

## 2021-11-12 ENCOUNTER — Other Ambulatory Visit: Payer: Self-pay | Admitting: Family Medicine

## 2021-11-12 ENCOUNTER — Other Ambulatory Visit: Payer: Self-pay | Admitting: Allergy and Immunology

## 2021-11-12 DIAGNOSIS — E119 Type 2 diabetes mellitus without complications: Secondary | ICD-10-CM

## 2021-11-19 ENCOUNTER — Encounter: Payer: Self-pay | Admitting: Allergy and Immunology

## 2021-11-24 ENCOUNTER — Telehealth: Payer: Self-pay

## 2021-11-24 DIAGNOSIS — J4550 Severe persistent asthma, uncomplicated: Secondary | ICD-10-CM

## 2021-11-24 MED ORDER — FLUTICASONE FUROATE-VILANTEROL 100-25 MCG/ACT IN AEPB: 1.0000 | INHALATION_SPRAY | Freq: Every day | RESPIRATORY_TRACT | 0 refills | Status: DC

## 2021-11-24 NOTE — Telephone Encounter (Signed)
-----   Message from Jiles Prows, MD sent at 11/24/2021 11:03 AM EST ----- Breo 100 should not cause a prostate issues. It does not have a anti-cholinergic agents which is the agent that causes most of the prostate issues. Breo 100 - 1 inhalation 1 time per day. ----- Message ----- From: Cristopher Peru, CMA Sent: 11/24/2021   9:59 AM EST To: Jiles Prows, MD  Dr. 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 Per Father [name] - Symbicort caused cataracts which he has already had surgery for. Patient stated he has never tried [place] and is willing. Patient stated he wants to make sure Memory Dance will not cause any prostate issues - if not , he is open to trying it.   Please advise. 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

## 2021-11-24 NOTE — Telephone Encounter (Signed)
Called patient - DOB/pharmacy verified - advised of provider's notation regarding Breo 100. Discontinue the Advair and begin Breo Ellipta 100-25 mcg/ACT 1 inhalation 1 time a day. Advised inhaler will be electronically sent to [facility] today, 11/24/21. Patient advised to let us/provider know how he's doing on Breo. Patient verbalized understanding - no further questions.   Breo Ellipta 100 - 25 mcg/ACT AEPB 1  inhalation 1 time a day electronically sent to pharmacy.

## 2021-11-25 ENCOUNTER — Other Ambulatory Visit: Payer: Self-pay | Admitting: *Deleted

## 2021-11-30 ENCOUNTER — Other Ambulatory Visit: Payer: Self-pay | Admitting: Family Medicine

## 2021-11-30 DIAGNOSIS — E119 Type 2 diabetes mellitus without complications: Secondary | ICD-10-CM

## 2021-12-15 ENCOUNTER — Other Ambulatory Visit: Payer: Self-pay | Admitting: Allergy and Immunology

## 2021-12-25 ENCOUNTER — Other Ambulatory Visit: Payer: Self-pay | Admitting: Family Medicine

## 2022-01-18 ENCOUNTER — Telehealth: Payer: 59 | Admitting: Physician Assistant

## 2022-01-18 DIAGNOSIS — J208 Acute bronchitis due to other specified organisms: Secondary | ICD-10-CM

## 2022-01-18 DIAGNOSIS — B9689 Other specified bacterial agents as the cause of diseases classified elsewhere: Secondary | ICD-10-CM

## 2022-01-18 MED ORDER — BENZONATATE 100 MG PO CAPS: 100.0000 mg | ORAL_CAPSULE | Freq: Three times a day (TID) | ORAL | 0 refills | Status: DC | PRN

## 2022-01-18 MED ORDER — DOXYCYCLINE HYCLATE 100 MG PO TABS: 100.0000 mg | ORAL_TABLET | Freq: Two times a day (BID) | ORAL | 0 refills | Status: DC

## 2022-01-18 MED ORDER — PREDNISONE 20 MG PO TABS: 40.0000 mg | ORAL_TABLET | Freq: Every day | ORAL | 0 refills | Status: DC

## 2022-01-18 NOTE — Progress Notes (Signed)
Virtual Visit Consent   Aaron Drake, you are scheduled for a virtual visit with a [place] provider today.     Just as with appointments in the office, your consent must be obtained to participate.  Your consent will be active for this visit and any virtual visit you may have with one of our providers in the next 365 days.     If you have a MyChart account, a copy of this consent can be sent to you electronically.  All virtual visits are billed to your insurance company just like a traditional visit in the office.    As this is a virtual visit, video technology does not allow for your provider to perform a traditional examination.  This may limit your provider's ability to fully assess your condition.  If your provider identifies any concerns that need to be evaluated in person or the need to arrange testing (such as labs, EKG, etc.), we will make arrangements to do so.     Although advances in technology are sophisticated, we cannot ensure that it will always work on either your end or our end.  If the connection with a video visit is poor, the visit may have to be switched to a telephone visit.  With either a video or telephone visit, we are not always able to ensure that we have a secure connection.     I need to obtain your verbal consent now.   Are you willing to proceed with your visit today?    Aaron Drake has provided verbal consent on 01/18/2022 for a virtual visit (video or telephone).   Leeanne Rio, [place]   Date: 01/18/2022 3:49 PM   Virtual Visit via Video Note   I, Leeanne Rio, connected with  Aaron Drake  ([id number], Jun 22, 1956) on 01/18/22 at  3:45 PM EST by a video-enabled telemedicine application and verified that I am speaking with the correct person using two identifiers.  Location: Patient: Virtual Visit Location Patient: Home Provider: Virtual Visit Location Provider: Home Office   I discussed the limitations of evaluation and management by  telemedicine and the availability of in person appointments. The patient expressed understanding and agreed to proceed.    History of Present Illness: Aaron Drake is a 66 y.o. who identifies as a male who was assigned male at birth, and is being seen today for URI symptoms starting about a week or so ago. Notes chest congestion, cough that is sometimes productive, chest tightness and wheezing. Denies chest pain, sinus pain, ear pain. Endorses recent cruise and getting sick coming off of his cruise. Notes some of the areas they were in they had bad air pollution which flared up his asthma. Has been using OTC Mucinex and his albuterol. Has taken home COVID test x 2 which have been negative.  HPI: HPI  Problems:  Patient Active Problem List   Diagnosis Date Noted   Left sided sciatica 09/21/2021   Asthma, severe persistent, well-controlled 10/30/2019   Other allergic rhinitis 10/30/2019   Other atopic dermatitis 10/30/2019   Gastroesophageal reflux disease 10/30/2019   Hypertension associated with diabetes ([place]) 41/28/7867   Erosive oral lichen planus, Dx via Bx, Tx with Dexamethasone rinse, followed by Periodontist 01/28/2019   Primary osteoarthritis of right knee 08/15/2018   Myalgia due to statin 07/04/2018   Renal calculi 06/21/2018   Morbid obesity ([place]) 03/26/2017   Type 2 diabetes mellitus without complication, without long-term current use of insulin ([place])  03/26/2017   BPH (benign prostatic hyperplasia) 05/19/2012   Hyperlipidemia associated with type 2 diabetes mellitus ([place])     Allergies:  Allergies  Allergen Reactions   Airduo Digihaler [Fluticasone-Salmeterol] Other (See Comments)    Redness -Head to toe; skin Peeling - Head to to   Lipitor [Atorvastatin] Other (See Comments)    Muscle cramps and joint ache   Medications:  Current Outpatient Medications:    benzonatate (TESSALON) 100 MG capsule, Take 1 capsule (100 mg total) by mouth 3 (three) times daily as needed for  cough., Disp: 30 capsule, Rfl: 0   doxycycline (VIBRA-TABS) 100 MG tablet, Take 1 tablet (100 mg total) by mouth 2 (two) times daily., Disp: 14 tablet, Rfl: 0   predniSONE (DELTASONE) 20 MG tablet, Take 2 tablets (40 mg total) by mouth daily with breakfast., Disp: 10 tablet, Rfl: 0   albuterol (VENTOLIN HFA) 108 (90 Base) MCG/ACT inhaler, USE 2 INHALATIONS ORALLY   EVERY 6 HOURS AS NEEDED FORWHEEZING OR SHORTNESS OF   BREATH, Disp: 8 g, Rfl: 1   Calcium Carb-Cholecalciferol (CALCIUM + D3 PO), Take 1 tablet by mouth daily., Disp: , Rfl:    DUPIXENT 300 MG/2ML prefilled syringe, INJECT 1 SYRINGE UNDER THE SKIN EVERY OTHER WEEK, Disp: 4 mL, Rfl: 11   dutasteride (AVODART) 0.5 MG capsule, TAKE 1 CAPSULE DAILY, Disp: 90 capsule, Rfl: 3   EPINEPHrine (EPIPEN 2-PAK) 0.3 mg/0.3 mL IJ SOAJ injection, Inject 0.3 mg into the muscle as needed for anaphylaxis., Disp: 1 each, Rfl: 1   finasteride (PROSCAR) 5 MG tablet, Take 5 mg by mouth daily., Disp: , Rfl:    montelukast (SINGULAIR) 10 MG tablet, TAKE 1 TABLET DAILY, Disp: 90 tablet, Rfl: 1   Multiple Vitamin (MULTIVITAMIN WITH MINERALS) TABS tablet, Take 1 tablet by mouth daily., Disp: , Rfl:    omeprazole (PRILOSEC) 20 MG capsule, TAKE 1 CAPSULE DAILY, Disp: 90 capsule, Rfl: 1   Semaglutide, 1 MG/DOSE, 4 MG/3ML SOPN, Inject 2 mg as directed once a week., Disp: 6 mL, Rfl: 3   tamsulosin (FLOMAX) 0.4 MG CAPS capsule, Take 0.4 mg by mouth 2 (two) times daily., Disp: , Rfl:   Observations/Objective: Patient is well-developed, well-nourished in no acute distress.  Resting comfortably at home.  Head is normocephalic, atraumatic.  No labored breathing. Speech is clear and coherent with logical content.  Patient is alert and oriented at baseline.  Assessment and Plan: 1. Acute bacterial bronchitis - benzonatate (TESSALON) 100 MG capsule; Take 1 capsule (100 mg total) by mouth 3 (three) times daily as needed for cough.  Dispense: 30 capsule; Refill: 0 -  doxycycline (VIBRA-TABS) 100 MG tablet; Take 1 tablet (100 mg total) by mouth 2 (two) times daily.  Dispense: 14 tablet; Refill: 0 - predniSONE (DELTASONE) 20 MG tablet; Take 2 tablets (40 mg total) by mouth daily with breakfast.  Dispense: 10 tablet; Refill: 0  Rx Doxycycline.  Increase fluids.  Rest.  Saline nasal spray.  Probiotic.  Mucinex as directed.  Humidifier in bedroom. Tessalon for cough. Prednisone for asthma exacerbation.  Call or return to clinic if symptoms are not improving.   Follow Up Instructions: I discussed the assessment and treatment plan with the patient. The patient was provided an opportunity to ask questions and all were answered. The patient agreed with the plan and demonstrated an understanding of the instructions.  A copy of instructions were sent to the patient via MyChart unless otherwise noted below.   The patient was advised to  call back or seek an in-person evaluation if the symptoms worsen or if the condition fails to improve as anticipated.  Time:  I spent 12 minutes with the patient via telehealth technology discussing the above problems/concerns.    Leeanne Rio, PA-C

## 2022-01-18 NOTE — Patient Instructions (Signed)
Aaron Drake, thank you for joining Leeanne Rio, PA-C for today's virtual visit.  While this provider is not your primary care provider (PCP), if your PCP is located in our provider database this encounter information will be shared with them immediately following your visit.  Consent: (Patient) Aaron Drake provided verbal consent for this virtual visit at the beginning of the encounter.  Current Medications:  Current Outpatient Medications:    albuterol (VENTOLIN HFA) 108 (90 Base) MCG/ACT inhaler, USE 2 INHALATIONS ORALLY   EVERY 6 HOURS AS NEEDED FORWHEEZING OR SHORTNESS OF   BREATH, Disp: 8 g, Rfl: 1   Calcium Carb-Cholecalciferol (CALCIUM + D3 PO), Take 1 tablet by mouth daily., Disp: , Rfl:    CRANBERRY PO, Take 2-3 capsules by mouth daily., Disp: , Rfl:    DUPIXENT 300 MG/2ML prefilled syringe, INJECT 1 SYRINGE UNDER THE SKIN EVERY OTHER WEEK, Disp: 4 mL, Rfl: 11   dutasteride (AVODART) 0.5 MG capsule, TAKE 1 CAPSULE DAILY, Disp: 90 capsule, Rfl: 3   EPINEPHrine (EPIPEN 2-PAK) 0.3 mg/0.3 mL IJ SOAJ injection, Inject 0.3 mg into the muscle as needed for anaphylaxis., Disp: 1 each, Rfl: 1   finasteride (PROSCAR) 5 MG tablet, Take 5 mg by mouth daily., Disp: , Rfl:    fluticasone furoate-vilanterol (BREO ELLIPTA) 100-25 MCG/ACT AEPB, Inhale 1 puff into the lungs daily., Disp: 60 each, Rfl: 0   montelukast (SINGULAIR) 10 MG tablet, TAKE 1 TABLET DAILY, Disp: 90 tablet, Rfl: 1   Multiple Vitamin (MULTIVITAMIN WITH MINERALS) TABS tablet, Take 1 tablet by mouth daily., Disp: , Rfl:    omeprazole (PRILOSEC) 20 MG capsule, TAKE 1 CAPSULE DAILY, Disp: 90 capsule, Rfl: 1   Semaglutide, 1 MG/DOSE, 4 MG/3ML SOPN, Inject 2 mg as directed once a week., Disp: 6 mL, Rfl: 3   Spacer/Aero-Holding Chambers DEVI, Take 1 each by mouth as directed. Rinse mouth after each use., Disp: 1 each, Rfl: 0   tamsulosin (FLOMAX) 0.4 MG CAPS capsule, Take 0.4 mg by mouth 2 (two) times daily., Disp: , Rfl:     Medications ordered in this encounter:  No orders of the defined types were placed in this encounter.    *If you need refills on other medications prior to your next appointment, please contact your pharmacy*  Follow-Up: Call back or seek an in-person evaluation if the symptoms worsen or if the condition fails to improve as anticipated.  Other Instructions Take antibiotic (Doxycycline) as directed.  Increase fluids.  Get plenty of rest. Use Mucinex for congestion. Use the prednisone and Tessalon as directed. Take a daily probiotic (I recommend Align or Culturelle, but even Activia Yogurt may be beneficial).  A humidifier placed in the bedroom may offer some relief for a dry, scratchy throat of nasal irritation.  Read information below on acute bronchitis. Please call or return to clinic if symptoms are not improving.  Acute Bronchitis Bronchitis is when the airways that extend from the windpipe into the lungs get red, puffy, and painful (inflamed). Bronchitis often causes thick spit (mucus) to develop. This leads to a cough. A cough is the most common symptom of bronchitis. In acute bronchitis, the condition usually begins suddenly and goes away over time (usually in 2 weeks). Smoking, allergies, and asthma can make bronchitis worse. Repeated episodes of bronchitis may cause more lung problems.  HOME CARE Rest. Drink enough fluids to keep your pee (urine) clear or pale yellow (unless you need to limit fluids as told by your  doctor). Only take over-the-counter or prescription medicines as told by your doctor. Avoid smoking and secondhand smoke. These can make bronchitis worse. If you are a smoker, think about using nicotine gum or skin patches. Quitting smoking will help your lungs heal faster. Reduce the chance of getting bronchitis again by: Washing your hands often. Avoiding people with cold symptoms. Trying not to touch your hands to your mouth, nose, or eyes. Follow up with your  doctor as told.  GET HELP IF: Your symptoms do not improve after 1 week of treatment. Symptoms include: Cough. Fever. Coughing up thick spit. Body aches. Chest congestion. Chills. Shortness of breath. Sore throat.  GET HELP RIGHT AWAY IF:  You have an increased fever. You have chills. You have severe shortness of breath. You have bloody thick spit (sputum). You throw up (vomit) often. You lose too much body fluid (dehydration). You have a severe headache. You faint.  MAKE SURE YOU:  Understand these instructions. Will watch your condition. Will get help right away if you are not doing well or get worse. Document Released: 05/03/2008 Document Revised: 07/18/2013 Document Reviewed: 05/08/2013 [facility] Patient Information 2015 [place], [place]. This information is not intended to replace advice given to you by your health care provider. Make sure you discuss any questions you have with your health care provider.    If you have been instructed to have an in-person evaluation today at a local Urgent Care facility, please use the link below. It will take you to a list of all of our available [facility] [place] [facility], including address, phone number and hours of operation. Please do not delay care.  [facility]  If you or a family member do not have a primary care provider, use the link below to schedule a visit and establish care. When you choose a [facility] primary care physician or advanced practice provider, you gain a long-term partner in health. Find a Primary Care Provider  Learn more about [facility]'s in-office and virtual care options: [facility]

## 2022-02-02 ENCOUNTER — Encounter: Payer: Self-pay | Admitting: Allergy and Immunology

## 2022-02-02 ENCOUNTER — Other Ambulatory Visit: Payer: Self-pay | Admitting: *Deleted

## 2022-02-02 ENCOUNTER — Other Ambulatory Visit: Payer: Self-pay | Admitting: Allergy and Immunology

## 2022-02-02 MED ORDER — BREO ELLIPTA 100-25 MCG/ACT IN AEPB: 1.0000 | INHALATION_SPRAY | Freq: Every day | RESPIRATORY_TRACT | 5 refills | Status: DC

## 2022-02-04 ENCOUNTER — Other Ambulatory Visit: Payer: Self-pay | Admitting: Family Medicine

## 2022-02-04 NOTE — Telephone Encounter (Signed)
2 months ago by Althea Charon, FNP ?

## 2022-02-05 NOTE — Patient Instructions (Addendum)
Asthma ? Continue Breo 100 mcg 1 puff once a day.Rinse mouth out afterwards. We will send a prescription to [facility]. [facility] to see if they or one of the other local [facility] have Breo ?Continue albuterol  2 puffs 4 to 6 hours as needed for cough, wheeze, tightness in chest, or shortness of breath. Try to cut down on the evening dose of albuterol.  ?Continue Dupixent injections every 2 weeks for now. Information given on Nucala. Call our office if you are interested in starting Nucala and stopping Dupixent. ? ?Atopic dermatitis. ?Continue daily moisturizing program ?Continue Dupixent injections every 2 weeks as above for now ? ?Allergic rhinitis ?Continue Claritin 10 mg once a day as needed for runny nose or itching ? ?Gastroesophageal reflux disease ?Continue omeprazole once a day. ?Continue pepcid as needed at night ?Continue dietary and lifestyle modifications as below. ? ?Epistaxis ?Pinch both nostrils while leaning forward for at least 5 minutes before checking to see if the bleeding has stopped. If bleeding is not controlled within 5-10 minutes apply a cotton ball soaked with oxymetazoline (Afrin) to the bleeding nostril for a few seconds.  ?Continue to follow up with Dr. Benjamine Mola ? ? ? Return to clinic in 6-8 weeks with Dr. Neldon Mc  or earlier if problem.  ? ?Lifestyle Changes for Controlling GERD ?When you have GERD, stomach acid feels as if it?s backing up toward your mouth. ?Whether or not you take medication to control your GERD, your symptoms can often be ?improved with lifestyle changes.  ? ?Raise Your Head ?Reflux is more likely to strike when you?re lying down flat, because stomach fluid can ?flow backward more easily. Raising the head of your bed 4-6 inches can help. To do this: ?Slide blocks or books under the legs at the head of your bed. Or, place a wedge under ?the mattress. Many foam stores can make a suitable wedge for you. The wedge ?should run from your waist to the top of your  head. ?Don?t just prop your head on several pillows. This increases pressure on your ?stomach. It can make GERD worse. ? ?Watch Your Eating Habits ?Certain foods may increase the acid in your stomach or relax the lower esophageal ?sphincter, making GERD more likely. It?s best to avoid the following: ?Coffee, tea, and carbonated drinks (with and without caffeine) ?Fatty, fried, or spicy food ?Mint, chocolate, onions, and tomatoes ?Any other foods that seem to irritate your stomach or cause you pain ? ?Relieve the Pressure ?Eat smaller meals, even if you have to eat more often. ?Don?t lie down right after you eat. Wait a few hours for your stomach to empty. ?Avoid tight belts and tight-fitting clothes. ?Lose excess weight. ? ?Tobacco and Alcohol ?Avoid smoking tobacco and drinking alcohol. They can make GERD symptoms worse. ? ? ?  ? ? ? ?

## 2022-02-08 ENCOUNTER — Other Ambulatory Visit: Payer: Self-pay

## 2022-02-08 ENCOUNTER — Encounter: Payer: Self-pay | Admitting: Family

## 2022-02-08 ENCOUNTER — Ambulatory Visit: Payer: 59 | Admitting: Family

## 2022-02-08 ENCOUNTER — Encounter: Payer: Self-pay | Admitting: Allergy and Immunology

## 2022-02-08 VITALS — BP 120/64 | HR 93 | Temp 97.8°F | Resp 16 | Ht 64.0 in | Wt 212.4 lb

## 2022-02-08 DIAGNOSIS — R338 Other retention of urine: Secondary | ICD-10-CM

## 2022-02-08 DIAGNOSIS — J4550 Severe persistent asthma, uncomplicated: Secondary | ICD-10-CM

## 2022-02-08 DIAGNOSIS — L2089 Other atopic dermatitis: Secondary | ICD-10-CM

## 2022-02-08 DIAGNOSIS — J3089 Other allergic rhinitis: Secondary | ICD-10-CM | POA: Diagnosis not present

## 2022-02-08 DIAGNOSIS — R040 Epistaxis: Secondary | ICD-10-CM

## 2022-02-08 DIAGNOSIS — N401 Enlarged prostate with lower urinary tract symptoms: Secondary | ICD-10-CM

## 2022-02-08 DIAGNOSIS — K219 Gastro-esophageal reflux disease without esophagitis: Secondary | ICD-10-CM

## 2022-02-08 MED ORDER — BREO ELLIPTA 100-25 MCG/ACT IN AEPB: 1.0000 | INHALATION_SPRAY | Freq: Every day | RESPIRATORY_TRACT | 1 refills | Status: DC

## 2022-02-08 NOTE — Progress Notes (Signed)
spiro

## 2022-02-08 NOTE — Progress Notes (Signed)
[facility] [place]  [id number] Dept: [phone]  FOLLOW UP NOTE  Patient ID: Aaron Drake, male    DOB: 10-Apr-1956  Age: 66 y.o. MRN: [id number] Date of Office Visit: 02/08/2022  Assessment  Chief Complaint: Follow-up, Asthma (Doing ok. Memory Dance is working well but having issues picking it up from the pharmacy. Patient states that it is still in process on the computer when he checks online.), and Allergic Rhinitis  (Doing good)  HPI Aaron Drake is a 66 year old male who presents today for follow-up of not well controlled severe persistent asthma, atopic dermatitis, allergic rhinitis, gastroesophageal reflux disease, benign prostatic hyperplasia, and epistaxis.  He was last seen on November 09, 2021 by Althea Charon, FNP.  Since his last office visit he reports that in February he was diagnosed with bronchitis and did an online visit and was prescribed prednisone, doxycycline, and Tessalon Perles.  Severe persistent asthma is reported as not well controlled with Breo 100 mcg 1 puff once a day, Dupixent injections every 2 weeks, and albuterol as needed.  He reports that he took his last dose of Breo 100 mcg yesterday and [facility].  He has not tried calling to see if he can actually pick up the refill of Breo or if another store has it.  He is not able to use the Advair that was prescribed at the last office visit because it caused hoarseness.  He is having to do extra rinses after using Breo to help prevent hoarseness.  In the past he has used Symbicort and this caused cataract almost over night. With AirDuo he had an all over his body itchy rash. He reports tightness in his chest and is using his albuterol 1 puff 3 times a day.  The puff that he does at night before he goes to bed is more preventative rather than needed.  He does report that his albuterol use is down because he used to use his albuterol 6-7 times a day.  He denies  coughing, wheezing, shortness of breath, and nocturnal awakenings due to breathing problems.  He has been on 1 round of steroids since we last saw him for the bronchitis.  In the past he has tried [place] injections, but his asthma was not under good control.  He has not tried [place] or [employer].  Atopic dermatitis is reported as controlled with Dupixent injections every 2 weeks.  He does mention that his eczema was not that big of a problem before starting the [place].  Allergic rhinitis is reported as controlled with Claritin 10 mg once a day.  He reports a little bit of nasal congestion in the morning and denies rhinorrhea and postnasal drip.  He has not had any sinus infections since we last saw him.  He continues to follow-up with Dr. Benjamine Mola due to epistaxis.  He has had to have 2 rounds of silver nitrate due to epistaxis.  She has not had any nosebleeds since his last visit with Dr. Benjamine Mola.  He has a follow-up with Dr. Benjamine Mola in a couple of weeks.  Gastroesophageal reflux disease is reported as moderately controlled with omeprazole in the morning and Pepcid at night as needed.  He drinks a cup of coffee and a cup of tea in the morning.  Discussed decreasing his caffeine intake.  He reports that his reflux is more dependent on what he eats for supper.  Drug Allergies:  Allergies  Allergen Reactions  Airduo Digihaler [Fluticasone-Salmeterol] Other (See Comments)    Redness -Head to toe; skin Peeling - Head to to   Lipitor [Atorvastatin] Other (See Comments)    Muscle cramps and joint ache    Review of Systems: Review of Systems  Constitutional:  Negative for chills and fever.  HENT:         Reports some nasal congestion in the morning  Eyes:        Denies itchy watery eyes  Respiratory:  Negative for cough, shortness of breath and wheezing.        Reports tightness in his chest and denies cough, wheeze, shortness of breath and nocturnal awakenings  Cardiovascular:  Negative for chest pain  and palpitations.  Gastrointestinal:        Reports reflux at times depending on his diet  Genitourinary:  Positive for frequency.       Reports benign prostate hypertrophy  Skin:  Negative for itching and rash.  Neurological:  Negative for headaches.  Endo/Heme/Allergies:  Positive for environmental allergies.    Physical Exam: BP 120/64    Pulse 93    Temp 97.8 F (36.6 C) (Temporal)    Resp 16    Ht '5\' 4"'$  (1.626 m)    Wt 212 lb 6.4 oz (96.3 kg)    SpO2 96%    BMI 36.46 kg/m    Physical Exam Constitutional:      Appearance: Normal appearance.  HENT:     Head: Normocephalic and atraumatic.     Comments: Pharynx normal, eyes normal, ears: Unable to see right tympanic membrane due to cerumen.  Left ear normal, nose right nostril irritation with scabbing and no bleeding noted.  Left nostril normal    Right Ear: Ear canal and external ear normal.     Left Ear: Tympanic membrane, ear canal and external ear normal.     Mouth/Throat:     Mouth: Mucous membranes are moist.     Pharynx: Oropharynx is clear.  Eyes:     Conjunctiva/sclera: Conjunctivae normal.  Cardiovascular:     Rate and Rhythm: Regular rhythm.     Heart sounds: Normal heart sounds.  Pulmonary:     Effort: Pulmonary effort is normal.     Breath sounds: Normal breath sounds.     Comments: Lungs clear to auscultation Musculoskeletal:     Cervical back: Neck supple.  Skin:    General: Skin is warm.     Comments: No eczematous lesions noted  Neurological:     Mental Status: He is alert and oriented to person, place, and time.  Psychiatric:        Mood and Affect: Mood normal.        Behavior: Behavior normal.        Thought Content: Thought content normal.        Judgment: Judgment normal.    Diagnostics: FVC 2.48 L, FEV1 1.74 L (64%).  Predicted FVC 3.50 L, predicted FEV1 2.71 L.  Spirometry indicates possible mild restriction.  Spirometry is consistent with previous spirometry  Assessment and Plan: 1. Not  well controlled severe persistent asthma   2. Other atopic dermatitis   3. Other allergic rhinitis   4. Gastroesophageal reflux disease, unspecified whether esophagitis present   5. Epistaxis   6. Benign prostatic hyperplasia with urinary retention     No orders of the defined types were placed in this encounter.   Patient Instructions  Asthma  Continue Breo 100 mcg 1 puff once a  day.Rinse mouth out afterwards. [facility] prescription [facility]. Call your local [facility] to see if they or one of the other local [facility]S's have Breo Continue albuterol  2 puffs 4 to 6 hours as needed for cough, wheeze, tightness in chest, or shortness of breath. Try to cut down on the evening dose of albuterol.  Continue Dupixent injections every 2 weeks for now. Information given on Nucala. Call our office if you are interested in starting Nucala and stopping Dupixent.  Atopic dermatitis. Continue daily moisturizing program Continue Dupixent injections every 2 weeks as above for now  Allergic rhinitis Continue Claritin 10 mg once a day as needed for runny nose or itching  Gastroesophageal reflux disease Continue omeprazole once a day. Continue pepcid as needed at night Continue dietary and lifestyle modifications as below.  Epistaxis Pinch both nostrils while leaning forward for at least 5 minutes before checking to see if the bleeding has stopped. If bleeding is not controlled within 5-10 minutes apply a cotton ball soaked with oxymetazoline (Afrin) to the bleeding nostril for a few seconds.  Continue to follow up with Dr. Benjamine Mola    Return to clinic in 6-8 weeks with Dr. Neldon Mc  or earlier if problem.   Lifestyle Changes for Controlling GERD When you have GERD, stomach acid feels as if its backing up toward your mouth. Whether or not you take medication to control your GERD, your symptoms can often be improved with lifestyle changes.   Raise Your Head Reflux is more likely to strike when  youre lying down flat, because stomach fluid can flow backward more easily. Raising the head of your bed 4-6 inches can help. To do this: Slide blocks or books under the legs at the head of your bed. Or, place a wedge under the mattress. Many foam stores can make a suitable wedge for you. The wedge should run from your waist to the top of your head. Dont just prop your head on several pillows. This increases pressure on your stomach. It can make GERD worse.  Watch Your Eating Habits Certain foods may increase the acid in your stomach or relax the lower esophageal sphincter, making GERD more likely. Its best to avoid the following: Coffee, tea, and carbonated drinks (with and without caffeine) Fatty, fried, or spicy food Mint, chocolate, onions, and tomatoes Any other foods that seem to irritate your stomach or cause you pain  Relieve the Pressure Eat smaller meals, even if you have to eat more often. Dont lie down right after you eat. Wait a few hours for your stomach to empty. Avoid tight belts and tight-fitting clothes. Lose excess weight.  Tobacco and Alcohol Avoid smoking tobacco and drinking alcohol. They can make GERD symptoms worse.         Return in about 6 weeks (around 03/22/2022), or if symptoms worsen or fail to improve.    Thank you for the opportunity to care for this patient.  Please do not hesitate to contact me with questions.  Althea Charon, FNP [facility] [place] [facility]

## 2022-02-09 NOTE — Telephone Encounter (Signed)
Please let Mr. Lesko know that I will send a message to Tammy, our biologics coordinator, about stopping Dupixent and seeing if we can get Nucala approved for your asthma.

## 2022-02-15 NOTE — Telephone Encounter (Signed)
Thank you :)

## 2022-02-15 NOTE — Telephone Encounter (Signed)
Called patient and advised approval, copay card and submit to [facility] for Nucala  autoinjector with instrux to bring same in for initial dose ?

## 2022-02-24 ENCOUNTER — Ambulatory Visit: Payer: 59

## 2022-02-25 ENCOUNTER — Ambulatory Visit (INDEPENDENT_AMBULATORY_CARE_PROVIDER_SITE_OTHER): Payer: 59

## 2022-02-25 ENCOUNTER — Ambulatory Visit: Payer: 59

## 2022-02-25 DIAGNOSIS — J4550 Severe persistent asthma, uncomplicated: Secondary | ICD-10-CM | POA: Diagnosis not present

## 2022-02-25 MED ORDER — MEPOLIZUMAB 100 MG ~~LOC~~ SOLR
100.0000 mg | SUBCUTANEOUS | Status: DC
Start: 2022-02-25 — End: 2023-03-29
  Administered 2022-02-25: 100 mg via SUBCUTANEOUS

## 2022-02-25 NOTE — Progress Notes (Signed)
Patient came in today and started on Nucala. 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 Patient was reviewed on proper injection administration as well as proper injection sites. Patient practiced with the demo before injection himself with the Nucala in his right thigh. Patient was education on when to give himself the injections as well as remembering to take medication out thirty minutes before administration. After administration patient waited in the injection room lobby for thirty minutes without an issue.  ?

## 2022-02-26 ENCOUNTER — Other Ambulatory Visit: Payer: Self-pay | Admitting: *Deleted

## 2022-02-26 MED ORDER — BREO ELLIPTA 100-25 MCG/ACT IN AEPB: 1.0000 | INHALATION_SPRAY | Freq: Every day | RESPIRATORY_TRACT | 1 refills | Status: DC

## 2022-03-22 ENCOUNTER — Ambulatory Visit: Payer: 59 | Admitting: Family Medicine

## 2022-03-29 ENCOUNTER — Ambulatory Visit: Payer: 59 | Admitting: Family Medicine

## 2022-03-29 ENCOUNTER — Encounter: Payer: Self-pay | Admitting: Family Medicine

## 2022-03-29 VITALS — BP 117/80 | HR 95 | Temp 98.0°F | Ht 64.0 in | Wt 216.4 lb

## 2022-03-29 DIAGNOSIS — J4550 Severe persistent asthma, uncomplicated: Secondary | ICD-10-CM

## 2022-03-29 DIAGNOSIS — E119 Type 2 diabetes mellitus without complications: Secondary | ICD-10-CM | POA: Diagnosis not present

## 2022-03-29 DIAGNOSIS — N401 Enlarged prostate with lower urinary tract symptoms: Secondary | ICD-10-CM | POA: Diagnosis not present

## 2022-03-29 DIAGNOSIS — I152 Hypertension secondary to endocrine disorders: Secondary | ICD-10-CM

## 2022-03-29 DIAGNOSIS — M5432 Sciatica, left side: Secondary | ICD-10-CM

## 2022-03-29 DIAGNOSIS — E1159 Type 2 diabetes mellitus with other circulatory complications: Secondary | ICD-10-CM | POA: Diagnosis not present

## 2022-03-29 DIAGNOSIS — R338 Other retention of urine: Secondary | ICD-10-CM

## 2022-03-29 LAB — POCT GLYCOSYLATED HEMOGLOBIN (HGB A1C): Hemoglobin A1C: 5.9 % — AB (ref 4.0–5.6)

## 2022-03-29 MED ORDER — SEMAGLUTIDE (2 MG/DOSE) 8 MG/3ML ~~LOC~~ SOPN: 2.0000 mg | PEN_INJECTOR | SUBCUTANEOUS | 3 refills | Status: DC

## 2022-03-29 NOTE — Progress Notes (Signed)
? ?  Aaron Drake is a 66 y.o. male who presents today for an office visit. ? ?Assessment/Plan:  ?Chronic Problems Addressed Today: ?Type 2 diabetes mellitus without complication, without long-term current use of insulin ([place]) ?A1c stable at 5.9.  We will refill Ozempic 2 mg once weekly.  We will send in as a single injection -he had previously been doing 2 separate 1 mg injections in the same spot.  It is possible he may not have been getting full absorption.  We will recheck again in 6 months at his CPE. ? ?BPH (benign prostatic hyperplasia) ?Continue management per urology.  He has not had much improvement with Flomax and finasteride. ? ?Left sided sciatica ?Symptoms are overall stable.  We will fill out handicap placard form today. ? ?Asthma, severe persistent, well-controlled ?Stable.  Continue management per allergist.  ? ?Hypertension associated with diabetes ([place]) ?Blood pressure at goal today off meds. ? ?Morbid obesity ([place]) ?BMI 37.  Has not had any weight loss with Ozempic.  We will be switching to the 2 mg and a single injection dose as above.  Would consider trial of Mounjaro if he is not experiencing much weight loss with above change. ? ? ?  ?Subjective:  ?HPI: ? ?See A/p for status of chronic conditions.   ? ?   ?  ?Objective:  ?Physical Exam: ?BP 117/80 (BP Location: Left Arm)   Pulse 95   Temp 98 ?F (36.7 ?C) (Temporal)   Ht '5\' 4"'$  (1.626 m)   Wt 216 lb 6.4 oz (98.2 kg)   SpO2 96%   BMI 37.14 kg/m?   ?Wt Readings from Last 3 Encounters:  ?03/29/22 216 lb 6.4 oz (98.2 kg)  ?02/08/22 212 lb 6.4 oz (96.3 kg)  ?11/09/21 212 lb 3.2 oz (96.3 kg)  ? ? ?Gen: No acute distress, resting comfortably ?CV: Regular rate and rhythm with no murmurs appreciated ?Pulm: Normal work of breathing, clear to auscultation bilaterally with no crackles, wheezes, or rhonchi ?Neuro: Grossly normal, moves all extremities ?Psych: Normal affect and thought content ? ?   ? ?Algis Greenhouse. Jerline Pain, MD ?03/29/2022 10:34 AM  ?

## 2022-03-29 NOTE — Assessment & Plan Note (Signed)
Blood pressure at goal today off meds. 

## 2022-03-29 NOTE — Assessment & Plan Note (Signed)
BMI 37.  Has not had any weight loss with Ozempic.  We will be switching to the 2 mg and a single injection dose as above.  Would consider trial of Mounjaro if he is not experiencing much weight loss with above change. ?

## 2022-03-29 NOTE — Assessment & Plan Note (Signed)
Stable.  Continue management per allergist.  ?

## 2022-03-29 NOTE — Patient Instructions (Signed)
It was very nice to see you today! ? ?We will refill your medication today. ? ?We will see you back in 6 months for your annual physical with labs.  Come back sooner if needed. ? ?Take care, ?Dr Jerline Pain ? ?PLEASE NOTE: ? ?If you had any lab tests please let us know if you have not heard back within a few days. You may see your results on mychart before we have a chance to review them but we will give you a call once they are reviewed by [place]. If we ordered any referrals today, please let us know if you have not heard from their office within the next week.  ? ?Please try these tips to maintain a healthy lifestyle: ? ?Eat at least 3 REAL meals and 1-2 snacks per day.  Aim for no more than 5 hours between eating.  If you eat breakfast, please do so within one hour of getting up.  ? ?Each meal should contain half fruits/vegetables, one quarter protein, and one quarter carbs (no bigger than a computer mouse) ? ?Cut down on sweet beverages. This includes juice, soda, and sweet tea.  ? ?Drink at least 1 glass of water with each meal and aim for at least 8 glasses per day ? ?Exercise at least 150 minutes every week.   ?

## 2022-03-29 NOTE — Assessment & Plan Note (Signed)
Continue management per urology.  He has not had much improvement with Flomax and finasteride. ?

## 2022-03-29 NOTE — Assessment & Plan Note (Addendum)
Symptoms are overall stable.  We will fill out handicap placard form today. ?

## 2022-03-29 NOTE — Assessment & Plan Note (Signed)
A1c stable at 5.9.  We will refill Ozempic 2 mg once weekly.  We will send in as a single injection -he had previously been doing 2 separate 1 mg injections in the same spot.  It is possible he may not have been getting full absorption.  We will recheck again in 6 months at his CPE. ?

## 2022-04-27 ENCOUNTER — Ambulatory Visit: Payer: 59 | Admitting: Allergy and Immunology

## 2022-04-27 VITALS — BP 130/70 | HR 96 | Temp 97.4°F | Resp 18 | Ht 64.0 in | Wt 217.4 lb

## 2022-04-27 DIAGNOSIS — J4550 Severe persistent asthma, uncomplicated: Secondary | ICD-10-CM

## 2022-04-27 DIAGNOSIS — K219 Gastro-esophageal reflux disease without esophagitis: Secondary | ICD-10-CM

## 2022-04-27 DIAGNOSIS — J3089 Other allergic rhinitis: Secondary | ICD-10-CM

## 2022-04-27 DIAGNOSIS — L2089 Other atopic dermatitis: Secondary | ICD-10-CM

## 2022-04-27 MED ORDER — OMEPRAZOLE 20 MG PO CPDR: 20.0000 mg | DELAYED_RELEASE_CAPSULE | Freq: Every morning | ORAL | 1 refills | Status: DC

## 2022-04-27 MED ORDER — QVAR REDIHALER 80 MCG/ACT IN AERB: 2.0000 | INHALATION_SPRAY | Freq: Two times a day (BID) | RESPIRATORY_TRACT | 1 refills | Status: DC

## 2022-04-27 MED ORDER — FAMOTIDINE 40 MG PO TABS: 40.0000 mg | ORAL_TABLET | Freq: Every evening | ORAL | 1 refills | Status: DC

## 2022-04-27 MED ORDER — MONTELUKAST SODIUM 10 MG PO TABS
10.0000 mg | ORAL_TABLET | Freq: Every evening | ORAL | 1 refills | Status: DC
Start: 2022-04-27 — End: 2022-07-20

## 2022-04-27 NOTE — Patient Instructions (Addendum)
  1. Continue a combination of:   A. Qvar 80 - 2 inhalation twice a day w/ spacer (empty lungs).   B. montelukast 10 mg daily  C. Nucala injections  2. Continue Claritin and Albuterol HFA and topical treatment if needed.  3. Continue Omeprazole and famotidine  4. Submit for tezepelumab approval to replace Nucala  5. Return to clinic in 12 weeks or earlier if problem

## 2022-04-27 NOTE — Progress Notes (Unsigned)
[facility] [place] [facility] [place]   Follow-up Note  Referring Provider: Vivi Barrack, MD Primary Provider: Vivi Barrack, MD Date of Office Visit: 04/27/2022  Subjective:   Aaron Drake (DOB: 06/17/1956) is a 66 y.o. 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  His last visit to this clinic with me was 30 June 2021.  He has seen our nurse practitioner on multiple occasions since that point in time with his last visit occurring 08 February 2022.  One of his problems is the fact that he gets very significant laryngitis when using most inhaled controller agents.  It does not appear as though he can use a powdered inhaler as he has failed the administration of Breo recently.  He is now utilizing his Qvar which is much better at not precipitating laryngitis and still maintaining some control of his asthma.  However, his asthma is not under particular good control if you examined the extent of his bronchodilator requirement which is 4-7 times per day.  This occurs even though he is now using Nucala as his biologic agent in addition to Qvar and montelukast.  He has had very little problems with his nose recently.  He did have some significant epistaxis requiring cautery by Dr. Lorelee Cover, ENT.  His reflux is under very good control at this point, on omeprazole and famotidine.  He is going to have a cystoscopy for urinary retention sometime in the near future.  He is going to have removal of a left cataract at some point in the near future.  Allergies as of 04/27/2022       Reactions   Airduo Digihaler [fluticasone-salmeterol] Other (See Comments)   Redness -Head to toe; skin Peeling - Head to to   Lipitor [atorvastatin] Other (See Comments)   Muscle cramps and joint ache        Medication List     albuterol 108 (90 Base) MCG/ACT inhaler Commonly known as: VENTOLIN HFA USE 1 INHALATION ORALLY    EVERY 6 HOURS AS NEEDED FORWHEEZING OR SHORTNESS OF   BREATH   CALCIUM + D3 PO Take 1 tablet by mouth daily.   dutasteride 0.5 MG capsule Commonly known as: AVODART TAKE 1 CAPSULE DAILY   EPINEPHrine 0.3 mg/0.3 mL Soaj injection Commonly known as: EpiPen 2-Pak Inject 0.3 mg into the muscle as needed for anaphylaxis.   finasteride 5 MG tablet Commonly known as: PROSCAR Take 5 mg by mouth daily.   montelukast 10 MG tablet Commonly known as: SINGULAIR TAKE 1 TABLET DAILY   multivitamin with minerals Tabs tablet Take 1 tablet by mouth daily.   Nucala 100 MG/ML Soaj Generic drug: Mepolizumab   omeprazole 20 MG capsule Commonly known as: PRILOSEC TAKE 1 CAPSULE DAILY   Semaglutide (2 MG/DOSE) 8 MG/3ML Sopn Inject 2 mg as directed once a week.   tamsulosin 0.4 MG Caps capsule Commonly known as: FLOMAX Take 0.4 mg by mouth 2 (two) times daily.    Past Medical History:  Diagnosis Date   Allergy    Arthritis    Asthma 05/12/2012   BPH (benign prostatic hyperplasia) 05/19/2012   Diabetes mellitus without complication (HCC)    Dyspnea    physical stress; exercise    History of kidney stones    Hyperlipidemia    Impaired glucose tolerance  05/19/2012   Tear of meniscus of right knee 2018   partial tear    Past Surgical History:  Procedure Laterality Date   CATARACT EXTRACTION Right    EXTRACORPOREAL SHOCK WAVE LITHOTRIPSY Left 05/08/2018   Procedure: LEFT EXTRACORPOREAL SHOCK WAVE LITHOTRIPSY (ESWL);  Surgeon: [name], MD;  Location: [name];  Service: Urology;  Laterality: Left;   INSERTION OF MESH N/A 05/17/2016   Procedure: INSERTION OF MESH;  Surgeon: Rolm Bookbinder, MD;  Location: [facility];  Service: General;  Laterality: N/A;   UMBILICAL HERNIA REPAIR N/A 05/17/2016   Procedure: UMBILICAL HERNIA REPAIR;  Surgeon: Rolm Bookbinder, MD;  Location: [facility];   Service: General;  Laterality: N/A;   WISDOM TOOTH EXTRACTION      Review of systems negative except as noted in HPI / PMHx or noted below:  Review of Systems  Constitutional: Negative.   HENT: Negative.    Eyes: Negative.   Respiratory: Negative.    Cardiovascular: Negative.   Gastrointestinal: Negative.   Genitourinary: Negative.   Musculoskeletal: Negative.   Skin: Negative.   Neurological: Negative.   Endo/Heme/Allergies: Negative.   Psychiatric/Behavioral: Negative.      Objective:   Vitals:   04/27/22 1333  BP: 130/70  Pulse: 96  Resp: 18  Temp: (!) 97.4 F (36.3 C)  SpO2: 94%   Height: '5\' 4"'$  (162.6 cm)  Weight: 217 lb 6.4 oz (98.6 kg)   Physical Exam Constitutional:      Appearance: He is not diaphoretic.  HENT:     Head: Normocephalic.     Right Ear: Tympanic membrane, ear canal and external ear normal.     Left Ear: Tympanic membrane, ear canal and external ear normal.     Nose: Nose normal. No mucosal edema or rhinorrhea.     Mouth/Throat:     Pharynx: Uvula midline. No oropharyngeal exudate.  Eyes:     Conjunctiva/sclera: Conjunctivae normal.  Neck:     Thyroid: No thyromegaly.     Trachea: Trachea normal. No tracheal tenderness or tracheal deviation.  Cardiovascular:     Rate and Rhythm: Normal rate and regular rhythm.     Heart sounds: Normal heart sounds, S1 normal and S2 normal. No murmur heard. Pulmonary:     Effort: No respiratory distress.     Breath sounds: Normal breath sounds. No stridor. No wheezing or rales.  Lymphadenopathy:     Head:     Right side of head: No tonsillar adenopathy.     Left side of head: No tonsillar adenopathy.     Cervical: No cervical adenopathy.  Skin:    Findings: No erythema or rash.     Nails: There is no clubbing.  Neurological:     Mental Status: He is alert.    Diagnostics:    Spirometry was performed and demonstrated an FEV1 of 1.45 at 54 % of predicted.  Assessment and Plan:   No diagnosis  found.   1. Continue a combination of:   A. Qvar 80 - 2 inhalation twice a day w/ spacer (empty lungs).   B. montelukast 10 mg daily  C. Nucala injections  2. Continue Claritin and Albuterol HFA and topical treatment if needed.  3. Continue Omeprazole and famotidine  4. Submit for tezepelumab approval to replace Nucala  5. 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  He has basically failed benralizumab, mepolizumab, and dupilumab as biologic agents to control his asthma and we will now start him on tezepelumab once we get approval from his insurance company for this agents.  He will continue to treat his reflux with omeprazole and famotidine.  I will see him back in this clinic in 12 weeks or earlier if there is a problem.  Allena Katz, MD Allergy / Immunology [facility]

## 2022-04-28 ENCOUNTER — Encounter: Payer: Self-pay | Admitting: Allergy and Immunology

## 2022-04-30 ENCOUNTER — Telehealth: Payer: Self-pay | Admitting: *Deleted

## 2022-04-30 NOTE — Telephone Encounter (Signed)
-----   Message from Jiles Prows, MD sent at 04/28/2022  7:23 AM EDT ----- TEZE: failed dupi, mepo, benra

## 2022-04-30 NOTE — Telephone Encounter (Signed)
L/M for patient to contact me to advise appovla, copay card and submit for Tezspire pen to [employer]

## 2022-05-03 NOTE — Telephone Encounter (Signed)
Spoke to patient and advised approval, copay card email to him and submit to [facility]. Patient will contact clinic for initial injection in clinic

## 2022-05-04 ENCOUNTER — Telehealth: Payer: Self-pay

## 2022-05-04 ENCOUNTER — Other Ambulatory Visit: Payer: Self-pay | Admitting: Allergy and Immunology

## 2022-05-04 NOTE — Telephone Encounter (Signed)
Aaron Drake,Pharmacist/[facility] called - DOB verified - advised allergy alert on Qvar - corticosteroids ; wanted to make sure it's okay to send inhaler. Advised reviewed w/ provider  - ok to send inhaler.

## 2022-05-05 NOTE — Telephone Encounter (Signed)
Please advise to change from qvar to flovent as insurance no longer covers the qvar

## 2022-05-05 NOTE — Telephone Encounter (Signed)
Pt does not think he has ever used flovent before and is okay with the change

## 2022-05-07 ENCOUNTER — Other Ambulatory Visit: Payer: Self-pay | Admitting: Urology

## 2022-05-10 ENCOUNTER — Encounter: Payer: Self-pay | Admitting: Allergy and Immunology

## 2022-05-13 ENCOUNTER — Telehealth: Payer: Self-pay | Admitting: Allergy and Immunology

## 2022-05-13 MED ORDER — FLUTICASONE PROPIONATE HFA 110 MCG/ACT IN AERO: 2.0000 | INHALATION_SPRAY | Freq: Two times a day (BID) | RESPIRATORY_TRACT | 1 refills | Status: DC

## 2022-05-13 NOTE — Telephone Encounter (Signed)
Robb called in and states that [facility] is not filling Flovent.  [facility] would like Flovent sent to [facility] again to see if it will get refilled.  Please advise.

## 2022-05-13 NOTE — Telephone Encounter (Signed)
Send as requested per patient. I have spoke to him about it before sending this Rx again.

## 2022-05-14 ENCOUNTER — Other Ambulatory Visit: Payer: Self-pay | Admitting: Allergy and Immunology

## 2022-05-20 ENCOUNTER — Ambulatory Visit (INDEPENDENT_AMBULATORY_CARE_PROVIDER_SITE_OTHER): Payer: 59

## 2022-05-20 DIAGNOSIS — J4550 Severe persistent asthma, uncomplicated: Secondary | ICD-10-CM

## 2022-05-20 MED ORDER — EPINEPHRINE 0.3 MG/0.3ML IJ SOAJ
0.3000 mg | INTRAMUSCULAR | 1 refills | Status: AC | PRN
Start: 2022-05-20 — End: ?

## 2022-05-20 MED ORDER — EPINEPHRINE 0.3 MG/0.3ML IJ SOAJ: 0.3000 mg | INTRAMUSCULAR | 1 refills | Status: DC | PRN

## 2022-05-20 MED ORDER — TEZEPELUMAB-EKKO 210 MG/1.91ML ~~LOC~~ SOSY
210.0000 mg | PREFILLED_SYRINGE | SUBCUTANEOUS | Status: DC
  Administered 2022-05-20: 210 mg via SUBCUTANEOUS

## 2022-06-02 ENCOUNTER — Encounter (HOSPITAL_BASED_OUTPATIENT_CLINIC_OR_DEPARTMENT_OTHER): Payer: Self-pay | Admitting: Urology

## 2022-06-02 NOTE — Progress Notes (Signed)
Spoke w/ via phone for pre-op interview--- patient Lab needs dos---- Istat, EKG, POCT CBG             Lab results------ in EPIC COVID test -----patient states asymptomatic no test needed Arrive at ------- 0530 on Thursday 06/10/22 NPO after MN NO Solid Food.  Clear liquids from MN until--- 0430 on 06/10/22 Med rec completed Medications to take morning of surgery ----- flovent and albuterol inhalers, avodart, pepcid and/or prilosec, proscar, singulair Diabetic medication ----- n/a (pt takes weekly ozempic qSaturday, no other DM medications) Patient instructed no nail polish to be worn day of surgery Patient instructed to bring photo id and insurance card day of surgery Patient aware to have Driver (ride ) / caregiver    for 24 hours after surgery -- Dustin Folks (friend) Patient Special Instructions ----- please bring albuterol and flovent inhalers with patient; also has epi pen at all times Pre-Op special Istructions ----- n/a Patient verbalized understanding of instructions that were given at this phone interview. Patient denies shortness of breath, chest pain, fever, cough at this phone interview.  Lyndel Pleasure, RN

## 2022-06-09 NOTE — Anesthesia Preprocedure Evaluation (Signed)
Anesthesia Evaluation  Patient identified by MRN, date of birth, ID band Patient awake    Reviewed: Allergy & Precautions, NPO status , Patient's Chart, lab work & pertinent test results  Airway Mallampati: III  TM Distance: >3 FB Neck ROM: Full    Dental no notable dental hx. (+) Teeth Intact, Dental Advisory Given   Pulmonary shortness of breath and with exertion, asthma , former smoker,    Pulmonary exam normal breath sounds clear to auscultation       Cardiovascular hypertension, Normal cardiovascular exam Rhythm:Regular Rate:Normal     Neuro/Psych negative neurological ROS  negative psych ROS   GI/Hepatic Neg liver ROS, GERD  Medicated,  Endo/Other  diabetes, Type 2  Renal/GU negative Renal ROS  negative genitourinary   Musculoskeletal  (+) Arthritis ,   Abdominal   Peds  Hematology negative hematology ROS (+)   Anesthesia Other Findings   Reproductive/Obstetrics                           Anesthesia Physical Anesthesia Plan  ASA: 3  Anesthesia Plan: General   Post-op Pain Management: Tylenol PO (pre-op)*   Induction: Intravenous  PONV Risk Score and Plan: 2 and Ondansetron, Dexamethasone and Midazolam  Airway Management Planned: LMA  Additional Equipment:   Intra-op Plan:   Post-operative Plan: Extubation in OR  Informed Consent: I have reviewed the patients History and Physical, chart, labs and discussed the procedure including the risks, benefits and alternatives for the proposed anesthesia with the patient or authorized representative who has indicated his/her understanding and acceptance.     Dental advisory given  Plan Discussed with: CRNA  Anesthesia Plan Comments:         Anesthesia Quick Evaluation

## 2022-06-10 ENCOUNTER — Ambulatory Visit (HOSPITAL_BASED_OUTPATIENT_CLINIC_OR_DEPARTMENT_OTHER)
Admission: RE | Admit: 2022-06-10 | Discharge: 2022-06-10 | Disposition: A | Discharge status: Home / Self Care | Payer: 59 | Attending: Urology | Admitting: Urology

## 2022-06-10 ENCOUNTER — Encounter (HOSPITAL_BASED_OUTPATIENT_CLINIC_OR_DEPARTMENT_OTHER): Payer: Self-pay | Admitting: Urology

## 2022-06-10 ENCOUNTER — Ambulatory Visit (HOSPITAL_BASED_OUTPATIENT_CLINIC_OR_DEPARTMENT_OTHER): Payer: 59 | Admitting: Anesthesiology

## 2022-06-10 ENCOUNTER — Encounter (HOSPITAL_BASED_OUTPATIENT_CLINIC_OR_DEPARTMENT_OTHER)
Admission: RE | Disposition: A | Discharge status: Home / Self Care | Payer: Self-pay | Source: Home / Self Care | Attending: Urology

## 2022-06-10 ENCOUNTER — Other Ambulatory Visit: Payer: Self-pay

## 2022-06-10 DIAGNOSIS — Z87891 Personal history of nicotine dependence: Secondary | ICD-10-CM | POA: Diagnosis not present

## 2022-06-10 DIAGNOSIS — K219 Gastro-esophageal reflux disease without esophagitis: Secondary | ICD-10-CM | POA: Insufficient documentation

## 2022-06-10 DIAGNOSIS — I10 Essential (primary) hypertension: Secondary | ICD-10-CM | POA: Insufficient documentation

## 2022-06-10 DIAGNOSIS — N35912 Unspecified bulbous urethral stricture, male: Secondary | ICD-10-CM | POA: Diagnosis present

## 2022-06-10 DIAGNOSIS — M1990 Unspecified osteoarthritis, unspecified site: Secondary | ICD-10-CM | POA: Diagnosis not present

## 2022-06-10 DIAGNOSIS — E119 Type 2 diabetes mellitus without complications: Secondary | ICD-10-CM | POA: Insufficient documentation

## 2022-06-10 DIAGNOSIS — N401 Enlarged prostate with lower urinary tract symptoms: Secondary | ICD-10-CM

## 2022-06-10 DIAGNOSIS — N35919 Unspecified urethral stricture, male, unspecified site: Secondary | ICD-10-CM | POA: Diagnosis not present

## 2022-06-10 DIAGNOSIS — J45909 Unspecified asthma, uncomplicated: Secondary | ICD-10-CM | POA: Diagnosis not present

## 2022-06-10 DIAGNOSIS — N35916 Unspecified urethral stricture, male, overlapping sites: Secondary | ICD-10-CM

## 2022-06-10 HISTORY — PX: BALLOON DILATION: SHX5330

## 2022-06-10 HISTORY — PX: CYSTOSCOPY WITH RETROGRADE URETHROGRAM: SHX6309

## 2022-06-10 LAB — POCT I-STAT, CHEM 8
BUN: 17 mg/dL (ref 8–23)
Calcium, Ion: 1.22 mmol/L (ref 1.15–1.40)
Chloride: 106 mmol/L (ref 98–111)
Creatinine, Ser: 0.8 mg/dL (ref 0.61–1.24)
Glucose, Bld: 135 mg/dL — ABNORMAL HIGH (ref 70–99)
HCT: 43 % (ref 39.0–52.0)
Hemoglobin: 14.6 g/dL (ref 13.0–17.0)
Potassium: 4 mmol/L (ref 3.5–5.1)
Sodium: 141 mmol/L (ref 135–145)
TCO2: 23 mmol/L (ref 22–32)

## 2022-06-10 LAB — GLUCOSE, CAPILLARY: Glucose-Capillary: 137 mg/dL — ABNORMAL HIGH (ref 70–99)

## 2022-06-10 SURGERY — CYSTOSCOPY WITH RETROGRADE URETHROGRAM
Anesthesia: General

## 2022-06-10 MED ORDER — PHENYLEPHRINE 80 MCG/ML (10ML) SYRINGE FOR IV PUSH (FOR BLOOD PRESSURE SUPPORT)
PREFILLED_SYRINGE | INTRAVENOUS | Status: DC | PRN
  Administered 2022-06-10: 160 ug via INTRAVENOUS

## 2022-06-10 MED ORDER — FENTANYL CITRATE (PF) 100 MCG/2ML IJ SOLN
INTRAMUSCULAR | Status: DC | PRN
  Administered 2022-06-10 (×2): 50 ug via INTRAVENOUS

## 2022-06-10 MED ORDER — PHENAZOPYRIDINE HCL 200 MG PO TABS: 200.0000 mg | ORAL_TABLET | Freq: Three times a day (TID) | ORAL | 0 refills | Status: DC | PRN

## 2022-06-10 MED ORDER — ACETAMINOPHEN 500 MG PO TABS
1000.0000 mg | ORAL_TABLET | Freq: Once | ORAL | Status: AC
Start: 2022-06-10 — End: 2022-06-10
  Administered 2022-06-10: 1000 mg via ORAL

## 2022-06-10 MED ORDER — ACETAMINOPHEN 500 MG PO TABS
ORAL_TABLET | ORAL | Status: AC
  Filled 2022-06-10: qty 2

## 2022-06-10 MED ORDER — KETOROLAC TROMETHAMINE 30 MG/ML IJ SOLN
INTRAMUSCULAR | Status: DC | PRN
  Administered 2022-06-10: 30 mg via INTRAVENOUS

## 2022-06-10 MED ORDER — PROPOFOL 10 MG/ML IV BOLUS
INTRAVENOUS | Status: DC | PRN
  Administered 2022-06-10: 150 mg via INTRAVENOUS

## 2022-06-10 MED ORDER — MIDAZOLAM HCL 2 MG/2ML IJ SOLN
INTRAMUSCULAR | Status: DC | PRN
  Administered 2022-06-10: 2 mg via INTRAVENOUS

## 2022-06-10 MED ORDER — PROPOFOL 10 MG/ML IV BOLUS
INTRAVENOUS | Status: AC
  Filled 2022-06-10: qty 20

## 2022-06-10 MED ORDER — WATER FOR IRRIGATION, STERILE IR SOLN
Status: DC | PRN
  Administered 2022-06-10: 3000 mL via SURGICAL_CAVITY

## 2022-06-10 MED ORDER — ONDANSETRON HCL 4 MG/2ML IJ SOLN
INTRAMUSCULAR | Status: DC | PRN
  Administered 2022-06-10: 4 mg via INTRAVENOUS

## 2022-06-10 MED ORDER — ONDANSETRON HCL 4 MG/2ML IJ SOLN
INTRAMUSCULAR | Status: AC
  Filled 2022-06-10: qty 2

## 2022-06-10 MED ORDER — MIDAZOLAM HCL 2 MG/2ML IJ SOLN
INTRAMUSCULAR | Status: AC
  Filled 2022-06-10: qty 2

## 2022-06-10 MED ORDER — LIDOCAINE 2% (20 MG/ML) 5 ML SYRINGE
INTRAMUSCULAR | Status: DC | PRN
  Administered 2022-06-10: 80 mg via INTRAVENOUS

## 2022-06-10 MED ORDER — LACTATED RINGERS IV SOLN: INTRAVENOUS | Status: DC

## 2022-06-10 MED ORDER — CIPROFLOXACIN IN D5W 400 MG/200ML IV SOLN
INTRAVENOUS | Status: AC
  Filled 2022-06-10: qty 200

## 2022-06-10 MED ORDER — IOHEXOL 300 MG/ML  SOLN
INTRAMUSCULAR | Status: DC | PRN
  Administered 2022-06-10: 50 mL

## 2022-06-10 MED ORDER — FENTANYL CITRATE (PF) 100 MCG/2ML IJ SOLN
INTRAMUSCULAR | Status: AC
  Filled 2022-06-10: qty 2

## 2022-06-10 MED ORDER — KETOROLAC TROMETHAMINE 30 MG/ML IJ SOLN
INTRAMUSCULAR | Status: AC
  Filled 2022-06-10: qty 1

## 2022-06-10 MED ORDER — PHENYLEPHRINE 80 MCG/ML (10ML) SYRINGE FOR IV PUSH (FOR BLOOD PRESSURE SUPPORT)
PREFILLED_SYRINGE | INTRAVENOUS | Status: AC
  Filled 2022-06-10: qty 10

## 2022-06-10 MED ORDER — FENTANYL CITRATE (PF) 100 MCG/2ML IJ SOLN: 25.0000 ug | INTRAMUSCULAR | Status: DC | PRN

## 2022-06-10 MED ORDER — LIDOCAINE HCL (PF) 2 % IJ SOLN
INTRAMUSCULAR | Status: AC
  Filled 2022-06-10: qty 5

## 2022-06-10 MED ORDER — CIPROFLOXACIN IN D5W 400 MG/200ML IV SOLN
400.0000 mg | INTRAVENOUS | Status: AC
  Administered 2022-06-10: 400 mg via INTRAVENOUS

## 2022-06-10 SURGICAL SUPPLY — 29 items
BAG DRAIN URO-CYSTO SKYTR STRL (DRAIN) ×3 IMPLANT
BAG DRN RND TRDRP ANRFLXCHMBR (UROLOGICAL SUPPLIES) ×1
BAG DRN UROCATH (DRAIN) ×1
BAG URINE DRAIN 2000ML AR STRL (UROLOGICAL SUPPLIES) ×1 IMPLANT
BAG URINE LEG 500ML (DRAIN) IMPLANT
BALLN NEPHROMAX 24X8X12 (BALLOONS)
BALLN NEPHROSTOMY (BALLOONS)
BALLN OPTILUME DCB 30X3X75 (BALLOONS)
BALLN OPTILUME DCB 30X5X75 (BALLOONS) ×4
BALLOON NEPHROMAX 24X8X12 (BALLOONS) IMPLANT
BALLOON NEPHROSTOMY (BALLOONS) IMPLANT
BALLOON OPTILUME DCB 30X3X75 (BALLOONS) IMPLANT
BALLOON OPTILUME DCB 30X5X75 (BALLOONS) IMPLANT
CATH COUDE FOLEY 2W 5CC 18FR (CATHETERS) IMPLANT
CATH FOLEY 2WAY SLVR  5CC 14FR (CATHETERS) ×2
CATH FOLEY 2WAY SLVR  5CC 16FR (CATHETERS)
CATH FOLEY 2WAY SLVR 5CC 14FR (CATHETERS) IMPLANT
CATH FOLEY 2WAY SLVR 5CC 16FR (CATHETERS) IMPLANT
CLOTH BEACON ORANGE TIMEOUT ST (SAFETY) ×2 IMPLANT
GLOVE BIO SURGEON STRL SZ7.5 (GLOVE) ×2 IMPLANT
GOWN STRL REUS W/TWL LRG LVL3 (GOWN DISPOSABLE) ×3 IMPLANT
GUIDEWIRE STR DUAL SENSOR (WIRE) ×2 IMPLANT
HOLDER FOLEY CATH W/STRAP (MISCELLANEOUS) ×1 IMPLANT
KIT TURNOVER CYSTO (KITS) ×2 IMPLANT
MANIFOLD NEPTUNE II (INSTRUMENTS) ×2 IMPLANT
NS IRRIG 500ML POUR BTL (IV SOLUTION) IMPLANT
PACK CYSTO (CUSTOM PROCEDURE TRAY) ×3 IMPLANT
TUBE CONNECTING 12X1/4 (SUCTIONS) ×1 IMPLANT
WATER STERILE IRR 3000ML UROMA (IV SOLUTION) ×2 IMPLANT

## 2022-06-10 NOTE — Anesthesia Postprocedure Evaluation (Signed)
Anesthesia Post Note  Patient: Aaron Drake  Procedure(s) Performed: CYSTOSCOPY WITH RETROGRAM URETHROGRAM URETHRAL BALLOON [place]     Patient location during evaluation: PACU Anesthesia Type: General Level of consciousness: awake and alert Pain management: pain level controlled Vital Signs Assessment: post-procedure vital signs reviewed and stable Respiratory status: spontaneous breathing, nonlabored ventilation, respiratory function stable and patient connected to nasal cannula oxygen Cardiovascular status: blood pressure returned to baseline and stable Postop Assessment: no apparent nausea or vomiting Anesthetic complications: no   No notable events documented.  Last Vitals:  Vitals:   06/10/22 0845 06/10/22 0846  BP: 140/80 140/80  Pulse: 84 81  Resp: 20 14  Temp:    SpO2: 96% 95%    Last Pain:  Vitals:   06/10/22 0533  TempSrc: Oral                 Rhina Kramme L Lanesha Azzaro

## 2022-06-10 NOTE — H&P (Signed)
66 year old male with a history of kidney stones and BPH who presents today for 2nd opinion. The patient is complaining of a weak stream and incomplete bladder emptying. He only gets up once a night. He denies any urgency. He does have to strain to void. He denies any trouble with constipation. He has been in asthmatic for quite some time and was taking medications that he has now stopped, but believes that initially those were contributing to his weak stream. His stream is some better after stopping the asthma medications.   [name] urologic history starts about 20 fiber 30 years ago when he started taken supplement 0 for BPH. Fifteen years ago he was started on dutasteride. About 3 years ago he started with tamsulosin, Rapaflo, and then ultimately Uroxatral. None of these have made any significant difference.   The patient does have a history of recurring urinary tract infections, but this is resolved and has not been present for quite some time. He denies any history of urinary retention.   The patient also has had kidney stones in the past, but has no flank pain today or any complaints of that. He has not a kidney stone in a while.   Patient is [employer].   Current regimen: Tamsulosin 0.8 mg and dutasteride.   1/23: Today the patient follows up for reevaluation after beginning maximal medical therapy. He finds that his symptoms may be mildly better, but his bother is still quite significant. He has a very weak stream that sprays. He does not empty well. He he subsequently has urinary frequency. Today's urine appears to be infected, but he has minimal symptoms of UTI today.   TUR S, 4/23: The patient has a approximately 15 g prostate with a very large median lobe   Interval: Today the patient is here for cystoscopy and preop evaluation.     ALLERGIES: Pro Air Statin Drugs    MEDICATIONS: Finasteride 5 mg tablet 1 tablet PO Daily  Omeprazole 20 mg tablet, delayed release   Tamsulosin Hcl 0.4 mg capsule 1 capsule PO BID  Albuterol Sulfate  Combivent Respimat  Dutasteride 0.5 mg capsule  Epinephrine 0.3 mg/0.3 ml auto-injector  Losartan Potassium 50 mg tablet  Montelukast Sodium 10 mg tablet  Nucala  Ozempic 1 mg/0.75 ml (4 mg/3 ml) pen injector  Qvar Redihaler  Turmeric     GU PSH: ESWL - 2019     NON-GU PSH: Cataract surgery Repair Umbilical Lesion     GU PMH: BPH w/LUTS - 04/01/2022, - 03/19/2022, - 02/22/2022, - 12/21/2021, - 08/21/2021, - 01/22/2021, - 12/08/2020, - 2020, - 2019 Incomplete bladder emptying - 04/01/2022, - 02/22/2022, - 12/21/2021, - 08/21/2021, - 12/08/2020 Weak Urinary Stream - 04/01/2022, - 02/22/2022, - 08/21/2021, - 01/22/2021, - 12/08/2020 Ureteral calculus - 2020, - 2019    NON-GU PMH: Asthma Diabetes Type 2 GERD Hypercholesterolemia    FAMILY HISTORY: Death In The Family Father - Father Death In The Family Mother - Mother   SOCIAL HISTORY: Marital Status: Single Preferred Language: English; Ethnicity: Not Hispanic Or Latino; Race: White Current Smoking Status: Patient does not smoke anymore. Has not smoked since 04/29/1996.   Tobacco Use Assessment Completed: Used Tobacco in last 30 days? Does not use drugs. Drinks 3 caffeinated drinks per day. Has not had a blood transfusion.    REVIEW OF SYSTEMS:    GU Review Male:   Patient denies trouble starting your stream, burning/ pain with urination, get up at night to urinate, stream starts  and stops, erection problems, leakage of urine, penile pain, have to strain to urinate , hard to postpone urination, and frequent urination.  Gastrointestinal (Upper):   Patient denies nausea, vomiting, and indigestion/ heartburn.  Gastrointestinal (Lower):   Patient denies diarrhea and constipation.  Constitutional:   Patient denies fever, night sweats, weight loss, and fatigue.  Skin:   Patient denies skin rash/ lesion and itching.  Eyes:   Patient denies blurred vision and double vision.   Ears/ Nose/ Throat:   Patient denies sore throat and sinus problems.  Hematologic/Lymphatic:   Patient denies swollen glands and easy bruising.  Cardiovascular:   Patient denies leg swelling and chest pains.  Respiratory:   Patient denies cough and shortness of breath.  Endocrine:   Patient denies excessive thirst.  Musculoskeletal:   Patient denies back pain and joint pain.  Neurological:   Patient denies headaches and dizziness.  Psychologic:   Patient denies depression and anxiety.   VITAL SIGNS: None   Complexity of Data:  Source Of History:  Patient  Records Review:   Previous Doctor Records, Previous Patient Records, POC Tool  Urine Test Review:   Urinalysis   08/21/21  PSA  Total PSA 0.26 ng/mL    PROCEDURES:         Flexible Cystoscopy - 52000  Risks, benefits, and some of the potential complications of the procedure were discussed at length with the patient including infection, bleeding, voiding discomfort, urinary retention, fever, chills, sepsis, and others. All questions were answered. Informed consent was obtained. Sterile technique and intraurethral analgesia were used.  Meatus:  Normal size. Normal location. Normal condition.  Urethra:  Mild penile stricture. Severe bulbous stricture.  External Sphincter:  Normal.  Verumontanum:  Normal.      The lower urinary tract was carefully examined. The procedure was well-tolerated and without complications. Antibiotic instructions were given. Instructions were given to call the office immediately for bloody urine, difficulty urinating, urinary retention, painful or frequent urination, fever, chills, nausea, vomiting or other illness. The patient stated that he understood these instructions and would comply with them.         Urinalysis w/Scope Dipstick Dipstick Cont'd Micro  Color: Yellow Bilirubin: Neg mg/dL WBC/hpf: 0 - 5/hpf  Appearance: Slightly Cloudy Ketones: Neg mg/dL RBC/hpf: NS (Not Seen)  Specific Gravity: 1.015  Blood: Neg ery/uL Bacteria: Mod (26-50/hpf)  pH: 5.5 Protein: Neg mg/dL Cystals: NS (Not Seen)  Glucose: Neg mg/dL Urobilinogen: 0.2 mg/dL Casts: NS (Not Seen)    Nitrites: Neg Trichomonas: Not Present    Leukocyte Esterase: Neg leu/uL Mucous: Not Present      Epithelial Cells: 0 - 5/hpf      Yeast: NS (Not Seen)      Sperm: Not Present    ASSESSMENT:      ICD-10 Details  1 GU:   Weak Urinary Stream - R39.12   2   Incomplete bladder emptying - R39.14   3   Bulbar urethral stricture - N35.011      PLAN:           Orders Labs Urine Culture          Document Letter(s):  Created for Patient: Clinical Summary         Notes:   The patient has urethral stricture disease. He has mild strictures in the anterior urethra which I was able to passively dilate with the scope and a much denser stricture approximately 12 French in the bulbous urethra. I was unable  to get the scope beyond that. I did get a wire in and discussed additional treatment options including dilating it in the office and placing a Foley catheter versus taking the patient to the operating room and doing an OptiLube balloon dilation. The patient is opted to proceed with dilation in the operating room. We did not leave a Foley catheter today at the patient's request, I did tell him that there would be a significant amount of dysuria and bleeding for the next 36 hours. We will try to get the OptiLube urethral dilation scheduled quickly.   I do suspect that the stricture is the primary etiology of his urinary tract symptoms, especially in light of the fact that his prostate was so small on ultrasound.

## 2022-06-10 NOTE — Transfer of Care (Signed)
Immediate Anesthesia Transfer of Care Note  Patient: MEYER ARORA  Procedure(s) Performed: Procedure(s) (LRB): CYSTOSCOPY WITH RETROGRAM URETHROGRAM (N/A) URETHRAL BALLOON DILATION-OPTILUME (N/A)  Patient Location: PACU  Anesthesia Type: General  Level of Consciousness: awake, oriented, sedated and patient cooperative  Airway & Oxygen Therapy: Patient Spontanous Breathing and Patient connected to face mask oxygen  Post-op Assessment: Report given to PACU RN and Post -op Vital signs reviewed and stable  Post vital signs: Reviewed and stable  Complications: No apparent anesthesia complications Last Vitals:  Vitals Value Taken Time  BP 135/86 06/10/22 0824  Temp 36 C 06/10/22 0824  Pulse 80 06/10/22 0828  Resp 17 06/10/22 0828  SpO2 93 % 06/10/22 0828  Vitals shown include unvalidated device data.  Last Pain:  Vitals:   06/10/22 0533  TempSrc: Oral         Complications: No notable events documented.

## 2022-06-10 NOTE — Progress Notes (Signed)
Dr. Hulan Fray by to speak with patient about Aaron Drake - responsible adult spending the night in the house for safety reasons need to have responsible adult with you for the first 24 hrs following anesthesia.  Patient and Aaron Drake agreed to that and Verbalized understanding.

## 2022-06-10 NOTE — Interval H&P Note (Signed)
History and Physical Interval Note:  06/10/2022 7:28 AM  Aaron Drake  has presented today for surgery, with the diagnosis of URETERAL STRICTURE.  The various methods of treatment have been discussed with the patient and family. After consideration of risks, benefits and other options for treatment, the patient has consented to  Procedure(s): CYSTOSCOPY WITH RETROGRAM URETHROGRAM (N/A) URETHRAL BALLOON DILATION-OPTILUME (N/A) as a surgical intervention.  [name] history has been reviewed, patient examined, no change in status, stable for surgery.  I have reviewed the patient's chart and labs.  Questions were answered to the patient's satisfaction.     Ardis Hughs

## 2022-06-10 NOTE — Discharge Instructions (Addendum)
Foley Catheter Care A soft, flexible tube (Foley catheter) may have been placed in your bladder to drain urine and fluid. Follow these instructions: Taking Care of the Catheter Keep the area where the catheter leaves your body clean.  Attach the catheter to the leg so there is no tension on the catheter.  Keep the drainage bag below the level of the bladder, but keep it OFF the floor.  Do not take long soaking baths. Your caregiver will give instructions about showering.  Wash your hands before touching ANYTHING related to the catheter or bag.  Using mild soap and warm water on a washcloth:  Clean the area closest to the catheter insertion site using a circular motion around the catheter.  Clean the catheter itself by wiping AWAY from the insertion site for several inches down the tube.  NEVER wipe upward as this could sweep bacteria up into the urethra (tube in your body that normally drains the bladder) and cause infection.  Place a small amount of sterile lubricant at the tip of the penis where the catheter is entering.  Taking Care of the Drainage Bags Two drainage bags may be taken home: a large overnight drainage bag, and a smaller leg bag which fits underneath clothing.  It is okay to wear the overnight bag at any time, but NEVER wear the smaller leg bag at night.  Keep the drainage bag well below the level of your bladder. This prevents backflow of urine into the bladder and allows the urine to drain freely.  Anchor the tubing to your leg to prevent pulling or tension on the catheter. Use tape or a leg strap provided by the hospital.  Empty the drainage bag when it is 1/2 to 3/4 full. Wash your hands before and after touching the bag.  Periodically check the tubing for kinks to make sure there is no pressure on the tubing which could restrict the flow of urine.  Changing the Drainage Bags Cleanse both ends of the clean bag with alcohol before changing.  Pinch off the rubber catheter to  avoid urine spillage during the disconnection.  Disconnect the dirty bag and connect the clean one.  Empty the dirty bag carefully to avoid a urine spill.  Attach the new bag to the leg with tape or a leg strap.  Cleaning the Drainage Bags Whenever a drainage bag is disconnected, it must be cleaned quickly so it is ready for the next use.  Wash the bag in warm, soapy water.  Rinse the bag thoroughly with warm water.  Soak the bag for 30 minutes in a solution of white vinegar and water (1 cup vinegar to 1 quart warm water).  Rinse with warm water.  SEEK MEDICAL CARE IF:  You have chills or night sweats.  You are leaking around your catheter or have problems with your catheter. It is not uncommon to have sporadic leakage around your catheter as a result of bladder spasms. If the leakage stops, there is not much need for concern. If you are uncertain, call your caregiver.  You develop side effects that you think are coming from your medicines.  SEEK IMMEDIATE MEDICAL CARE IF:  You are suddenly unable to urinate. Check to see if there are any kinks in the drainage tubing that may cause this. If you cannot find any kinks, call your caregiver immediately. This is an emergency.  You develop shortness of breath or chest pains.  Bleeding persists or clots develop in your urine.  You have a fever.  You develop pain in your back or over your lower belly (abdomen).  You develop pain or swelling in your legs.  Any problems you are having get worse rather than better.  MAKE SURE YOU:  Understand these instructions.  Will watch your condition.  Will get help right away if you are not doing well or get worse.     Post Anesthesia Home Care Instructions  Activity: Get plenty of rest for the remainder of the day. A responsible individual must stay with you for 24 hours following the procedure.  For the next 24 hours, DO NOT: -Drive a car -Paediatric nurse -Drink alcoholic beverages -Take any  medication unless instructed by your physician -Make any legal decisions or sign important papers.  Meals: Start with liquid foods such as gelatin or soup. Progress to regular foods as tolerated. Avoid greasy, spicy, heavy foods. If nausea and/or vomiting occur, drink only clear liquids until the nausea and/or vomiting subsides. Call your physician if vomiting continues.  Special Instructions/Symptoms: Your throat may feel dry or sore from the anesthesia or the breathing tube placed in your throat during surgery. If this causes discomfort, gargle with warm salt water. The discomfort should disappear within 24 hours.   Do not take any Tylenol until after 1:15 pm today if needed. Do not take any nonsteroidal anti inflammatories until after 2:15 pm today if needed.

## 2022-06-10 NOTE — Op Note (Signed)
Preoperative diagnosis:  Pan urethral stricture  Postoperative diagnosis:  same   Procedure: Retrograde urethrogram Urethral balloon dilation - optilume (x 2) cystoscopy  Surgeon: Ardis Hughs, MD  Anesthesia: General  Complications: None  Intraoperative findings:  #1:  Urethral stricture extended from the bulbous urethra to the anterior urethra just proximal to the fossa navicularis.  It was quite narrow, cobblestone appearing, and required 2 OPTi lumen balloons (5 cm) #2: Cystoscopy was performed once the urethra was opened and there were no significant abnormalities within the patient's bladder.  EBL: Minimal  Specimens: None  Indication: Aaron Drake is a 66 y.o. patient with history of voiding dysfunction and obstructive voiding symptoms, and was found to have urethral stricture disease.  After reviewing the management options for treatment, he elected to proceed with the above surgical procedure(s). We have discussed the potential benefits and risks of the procedure, side effects of the proposed treatment, the likelihood of the patient achieving the goals of the procedure, and any potential problems that might occur during the procedure or recuperation. Informed consent has been obtained.  Description of procedure:  The patient was taken to the operating room and general anesthesia was induced.  The patient was placed in the dorsal lithotomy position, prepped and draped in the usual sterile fashion, and preoperative antibiotics were administered. A preoperative time-out was performed.   17 French Foley catheter was inserted into the fossa ventricularis and the catheter balloon was inflated with 3 cc of sterile water.  I then performed a retrograde urethrogram with fluoroscopy, the above findings were noted.  I subsequently advanced the 21 French cystoscope into the patient's urethra into the area where the stricture began.  I passed a 5 cm OPTi lumen balloon into the  strictured area and inflated it to 10 mmHg for 3 minutes.  I then deflated the balloon and advance the scope and encountered a second area just proximal to where the initial balloon dilation began.  I then advanced a second OPTi lumen balloon into this space and inflated it again to 10 mmHg for 3 minutes.  We then deflated the balloon and I was able to get all the way into the patient's bladder.  Cystoscopy was performed with the above findings.  A 14 French Foley catheter was then placed into the patient's urethra and 10 cc of sterile water and still into the balloon.  He was subsequently extubated return the PACU in stable condition.  Disposition: The patient will be scheduled for catheter removal tomorrow.  Ardis Hughs, M.D.

## 2022-06-10 NOTE — Anesthesia Procedure Notes (Signed)
Procedure Name: LMA Insertion Date/Time: 06/10/2022 7:37 AM  Performed by: Suan Halter, CRNAPre-anesthesia Checklist: Patient identified, Emergency Drugs available, Suction available and Patient being monitored Patient Re-evaluated:Patient Re-evaluated prior to induction Oxygen Delivery Method: Circle system utilized Preoxygenation: Pre-oxygenation with 100% oxygen Induction Type: IV induction Ventilation: Mask ventilation without difficulty LMA: LMA inserted LMA Size: 4.0 Number of attempts: 1 Airway Equipment and Method: Bite block Placement Confirmation: positive ETCO2 Tube secured with: Tape Dental Injury: Teeth and Oropharynx as per pre-operative assessment

## 2022-06-11 ENCOUNTER — Encounter (HOSPITAL_BASED_OUTPATIENT_CLINIC_OR_DEPARTMENT_OTHER): Payer: Self-pay | Admitting: Urology

## 2022-07-07 ENCOUNTER — Encounter (INDEPENDENT_AMBULATORY_CARE_PROVIDER_SITE_OTHER): Payer: Self-pay

## 2022-07-07 LAB — HM DIABETES EYE EXAM

## 2022-07-09 ENCOUNTER — Encounter: Payer: Self-pay | Admitting: Family Medicine

## 2022-07-20 ENCOUNTER — Other Ambulatory Visit: Payer: Self-pay | Admitting: Allergy and Immunology

## 2022-07-20 ENCOUNTER — Ambulatory Visit: Payer: 59 | Admitting: Allergy and Immunology

## 2022-07-20 VITALS — BP 132/82 | HR 83 | Temp 98.2°F | Resp 16 | Ht 64.25 in | Wt 214.4 lb

## 2022-07-20 DIAGNOSIS — H8113 Benign paroxysmal vertigo, bilateral: Secondary | ICD-10-CM

## 2022-07-20 DIAGNOSIS — L2089 Other atopic dermatitis: Secondary | ICD-10-CM

## 2022-07-20 DIAGNOSIS — K219 Gastro-esophageal reflux disease without esophagitis: Secondary | ICD-10-CM | POA: Diagnosis not present

## 2022-07-20 DIAGNOSIS — J3089 Other allergic rhinitis: Secondary | ICD-10-CM | POA: Diagnosis not present

## 2022-07-20 DIAGNOSIS — J4550 Severe persistent asthma, uncomplicated: Secondary | ICD-10-CM

## 2022-07-20 MED ORDER — OMEPRAZOLE 20 MG PO CPDR: 20.0000 mg | DELAYED_RELEASE_CAPSULE | Freq: Every morning | ORAL | 1 refills | Status: DC

## 2022-07-20 MED ORDER — ALBUTEROL SULFATE HFA 108 (90 BASE) MCG/ACT IN AERS: 2.0000 | INHALATION_SPRAY | RESPIRATORY_TRACT | 0 refills | Status: DC | PRN

## 2022-07-20 MED ORDER — FAMOTIDINE 40 MG PO TABS: 40.0000 mg | ORAL_TABLET | Freq: Every evening | ORAL | 1 refills | Status: DC

## 2022-07-20 MED ORDER — FLUTICASONE PROPIONATE HFA 110 MCG/ACT IN AERO: 2.0000 | INHALATION_SPRAY | Freq: Every day | RESPIRATORY_TRACT | 1 refills | Status: DC

## 2022-07-20 MED ORDER — MECLIZINE HCL 12.5 MG PO TABS: 12.5000 mg | ORAL_TABLET | Freq: Two times a day (BID) | ORAL | 0 refills | Status: DC

## 2022-07-20 MED ORDER — MONTELUKAST SODIUM 10 MG PO TABS: 10.0000 mg | ORAL_TABLET | Freq: Every evening | ORAL | 1 refills | Status: DC

## 2022-07-20 NOTE — Progress Notes (Unsigned)
[facility] [facility] [place] [facility] [place]   Follow-up Note  Referring Provider: Vivi Barrack, MD Primary Provider: Vivi Barrack, MD Date of Office Visit: 07/20/2022  Subjective:   Aaron Drake (DOB: October 03, 1956) is a 66 y.o. male who returns to the [facility] on 07/20/2022 in re-evaluation of the following:  HPI: Aaron Drake returns to this clinic in evaluation of asthma, atopic dermatitis, allergic rhinitis, reflux.  His last visit to this clinic was 27 Apr 2022.  We started him on tezepelumab injections during his last visit and he has had two of these injections and he thinks that they definitely do help his airway.  He feels as though his breathing is better and he has decreased his requirement for short acting bronchodilator and has no nocturnal bronchospastic symptoms while he continues on tezepelumab and has also been using Flovent mostly just 1 time per day.  He cannot really use Flovent twice a day as it messes up his throat with the development of hoarseness even though he uses a spacer.  All other prescriptions for different forms of inhaled steroids have been denied by his insurance company.  His nose is doing well.  His reflux is under good control.  He has developed some positional vertigo over the course of the past 2 days without any tinnitus or hearing loss.  He had a similar episode about 8 years ago and it did take a few weeks for this previous episode to resolve.  His episode 8 years ago was was much worse and it was basically incapacitating for a few days.  He does not have any ear pain or fever or significant upper airway symptoms suggesting an infectious disease contributing to this issue.  He has had very little issues with the skin.  He does not use any topical agents.  Allergies as of 07/20/2022       Reactions   Airduo Digihaler [fluticasone-salmeterol] Other (See Comments)   Redness -Head to toe; skin Peeling - Head to to    Lipitor [atorvastatin] Other (See Comments)   Muscle cramps and joint ache        Medication List    albuterol 108 (90 Base) MCG/ACT inhaler Commonly known as: VENTOLIN HFA Inhale 2 puffs into the lungs every 4 (four) hours as needed for wheezing or shortness of breath.   CALCIUM + D3 PO Take 1 tablet by mouth daily.   EPINEPHrine 0.3 mg/0.3 mL Soaj injection Commonly known as: EpiPen 2-Pak Inject 0.3 mg into the muscle as needed for anaphylaxis.   famotidine 40 MG tablet Commonly known as: PEPCID Take 1 tablet (40 mg total) by mouth at bedtime.   fluticasone 110 MCG/ACT inhaler Commonly known as: Flovent HFA Inhale 2 puffs into the lungs daily.   meclizine 12.5 MG tablet Commonly known as: ANTIVERT Take 1 tablet (12.5 mg total) by mouth 2 (two) times daily. Take 1 tablet by mouth every 6 hours if needed for vertigo   montelukast 10 MG tablet Commonly known as: SINGULAIR Take 1 tablet (10 mg total) by mouth at bedtime.   multivitamin with minerals Tabs tablet Take 1 tablet by mouth daily.   omeprazole 20 MG capsule Commonly known as: PRILOSEC Take 1 capsule (20 mg total) by mouth in the morning.   phenazopyridine 200 MG tablet Commonly known as: Pyridium Take 1 tablet (200 mg total) by mouth 3 (three) times daily as needed for pain.   Semaglutide (2  MG/DOSE) 8 MG/3ML Sopn Inject 2 mg as directed once a week.    Past Medical History:  Diagnosis Date   Allergy    Arthritis    Asthma 05/12/2012   BPH (benign prostatic hyperplasia) 05/19/2012   Diabetes mellitus without complication ([place])    Dyspnea    physical stress; exercise    History of kidney stones    Hyperlipidemia    Impaired glucose tolerance 05/19/2012   Tear of meniscus of right knee 2018   partial tear    Past Surgical History:  Procedure Laterality Date   BALLOON DILATION N/A 06/10/2022   Procedure: URETHRAL BALLOON DILATION-OPTILUME;  Surgeon: Ardis Hughs, MD;  Location[facility];  Service: Urology;  Laterality: N/A;   CATARACT EXTRACTION Right    CYSTOSCOPY WITH RETROGRADE URETHROGRAM N/A 06/10/2022   Procedure: CYSTOSCOPY WITH RETROGRAM URETHROGRAM;  Surgeon: Ardis Hughs, MD;  Location: [facility];  Service: Urology;  Laterality: N/A;   EXTRACORPOREAL SHOCK WAVE LITHOTRIPSY Left 05/08/2018   Procedure: LEFT EXTRACORPOREAL SHOCK WAVE LITHOTRIPSY (ESWL);  Surgeon: [name], MD;  Location: [name];  Service: Urology;  Laterality: Left;   INSERTION OF MESH N/A 05/17/2016   Procedure: INSERTION OF MESH;  Surgeon: Rolm Bookbinder, MD;  Location: [place];  Service: General;  Laterality: N/A;   UMBILICAL HERNIA REPAIR N/A 05/17/2016   Procedure: UMBILICAL HERNIA REPAIR;  Surgeon: Rolm Bookbinder, MD;  Location: [place];  Service: General;  Laterality: N/A;   WISDOM TOOTH EXTRACTION      Review of systems negative except as noted in HPI / PMHx or noted below:  Review of Systems  Constitutional: Negative.   HENT: Negative.    Eyes: Negative.   Respiratory: Negative.    Cardiovascular: Negative.   Gastrointestinal: Negative.   Genitourinary: Negative.   Musculoskeletal: Negative.   Skin: Negative.   Neurological: Negative.   Endo/Heme/Allergies: Negative.   Psychiatric/Behavioral: Negative.       Objective:   Vitals:   07/20/22 0904  BP: 132/82  Pulse: 83  Resp: 16  Temp: 98.2 F (36.8 C)  SpO2: 95%   Height: 5' 4.25" (163.2 cm)  Weight: 214 lb 6.4 oz (97.3 kg)   Physical Exam Constitutional:      Appearance: He is not diaphoretic.  HENT:     Head: Normocephalic.     Right Ear: Tympanic membrane, ear canal and external ear normal.     Left Ear: Tympanic membrane, ear canal and external ear normal.     Nose: Nose normal. No mucosal edema or rhinorrhea.     Mouth/Throat:     Pharynx: Uvula midline. No oropharyngeal exudate.  Eyes:     Conjunctiva/sclera: Conjunctivae normal.  Neck:     Thyroid: No  thyromegaly.     Trachea: Trachea normal. No tracheal tenderness or tracheal deviation.  Cardiovascular:     Rate and Rhythm: Normal rate and regular rhythm.     Heart sounds: Normal heart sounds, S1 normal and S2 normal. No murmur heard. Pulmonary:     Effort: No respiratory distress.     Breath sounds: Normal breath sounds. No stridor. No wheezing or rales.  Lymphadenopathy:     Head:     Right side of head: No tonsillar adenopathy.     Left side of head: No tonsillar adenopathy.     Cervical: No cervical adenopathy.  Skin:    Findings: No erythema or rash.     Nails: There is no  clubbing.  Neurological:     Mental Status: He is alert.     Diagnostics:    Spirometry was performed and demonstrated an FEV1 of 1.47 at 54 % of predicted.  Assessment and Plan:   1. Asthma, severe persistent, well-controlled   2. Other atopic dermatitis   3. Other allergic rhinitis   4. Gastroesophageal reflux disease, unspecified whether esophagitis present   5. Benign paroxysmal positional vertigo due to bilateral vestibular disorder     1. Continue tezepelumab injections  2.  Continue Flovent 110 - 2 inhalations 1-2 times per day with spacer depending on disease activity  3.  Continue montelukast 10 mg -1 tablet 1 time per day  4. Continue Omeprazole 20 mg in AM + famotidine 40 mg in PM  5. Continue Claritin and Albuterol HFA and topical treatment if needed.  6. Start meclizine 12.5 mg every 6 hours if needed for vertigo.  7. Further evaluation for vertigo?  8. Obtain fall flu vaccine and RSV vaccine  9. Return to clinic in December 2023 or earlier if problem  Aaron Drake appears to have good control of his airway issue at this point in time and this is probably a reflection of him using anti-TSLP antibody on a consistent basis.  He will continue on relatively low-dose Flovent as he is intolerant of using high-dose secondary to laryngitis.  He will continue to address his reflux  issues as noted above.  He now has positional vertigo which was an issue that also presented about 8 years ago and were going to treat him with some meclizine to be used if needed and not have him undergo any further evaluation for this issue unless it is increasing in intensity or persistent.  Hopefully this is just a reflection of a mild viral illness.  If he does well I will see him back in this clinic in December 2023 or earlier if there is a problem.  Allena Katz, MD Allergy / Immunology [place]

## 2022-07-20 NOTE — Patient Instructions (Addendum)
  1. Continue tezepelumab injections  2.  Continue Flovent 110 - 2 inhalations 1-2 times per day with spacer depending on disease activity  3.  Continue montelukast 10 mg -1 tablet 1 time per day  4. Continue Omeprazole 20 mg in AM + famotidine 40 mg in PM  5. Continue Claritin and Albuterol HFA and topical treatment if needed.  6. Start meclizine 12.5 mg every 6 hours if needed for vertigo.  7. Further evaluation for vertigo?  8. Obtain fall flu vaccine and RSV vaccine  9. Return to clinic in December 2023 or earlier if problem

## 2022-07-21 ENCOUNTER — Encounter: Payer: Self-pay | Admitting: Allergy and Immunology

## 2022-07-23 ENCOUNTER — Other Ambulatory Visit: Payer: Self-pay | Admitting: Allergy and Immunology

## 2022-07-29 ENCOUNTER — Encounter: Payer: Self-pay | Admitting: Allergy and Immunology

## 2022-07-29 NOTE — Telephone Encounter (Addendum)
Called [phone]: Spoke to [employer], Education administrator - DOB verified - Gave verbal approval to change from Ventolin HFA 108 MCG/ACT to Proventil HFA 108 MCG/ACT due to patient's insurance no longer covering the Ventolin.

## 2022-08-23 ENCOUNTER — Encounter: Payer: Self-pay | Admitting: *Deleted

## 2022-08-27 ENCOUNTER — Ambulatory Visit: Payer: 59

## 2022-09-06 ENCOUNTER — Other Ambulatory Visit: Payer: Self-pay | Admitting: Family Medicine

## 2022-09-06 NOTE — Telephone Encounter (Signed)
Pharmacy comment: OZEMPIC PEN '2MG'$ (= '8MG'$ /3ML ('2MG'$ /DOSE) Is backordered.  Please submit a NEW RX.  Note, a prior auth may be required for an alt or change in dosing.  If so, you will receive another request regarding info for the PA.  OZEMPIC PEN '2MG'$ (= '8MG'$ /3ML ('2MG'$ /DOSE) Is backordered.  Please submit a NEW RX.  Note, a prior auth may be required for an alt or change in dosing.  If so, you will receive another request regarding info for the.

## 2022-09-07 ENCOUNTER — Other Ambulatory Visit: Payer: Self-pay | Admitting: *Deleted

## 2022-09-07 ENCOUNTER — Telehealth: Payer: Self-pay | Admitting: Family Medicine

## 2022-09-07 MED ORDER — TIRZEPATIDE 15 MG/0.5ML ~~LOC~~ SOAJ: 15.0000 mg | SUBCUTANEOUS | 0 refills | Status: DC

## 2022-09-07 NOTE — Telephone Encounter (Signed)
WE can try switching to mounjaro '15mg'$  once weekly.  Algis Greenhouse. Jerline Pain, MD 09/07/2022 11:04 AM

## 2022-09-07 NOTE — Telephone Encounter (Signed)
Patient states: -He was informed by [facility] today that ozempic would need a PA completed  - He is aware ozempic is on back order and that mounjaro has been sent in as a substitute - He has two doses of ozempic left   Patient requests: -Clarification on if he should complete ozempic doses prior to starting mounjaro - Next steps if mounjaro isn't approved for refill

## 2022-09-07 NOTE — Telephone Encounter (Signed)
Spoke with patient, patient aware Rx Ozempic in back order  Agreed with Rx Mounjaro  Rx send to [facility]

## 2022-09-07 NOTE — Telephone Encounter (Signed)
Caller States: -Ozempic is on back order. -Reference : [id number]  Caller Requests: -PCP team to change prescription to an alternative.

## 2022-09-10 NOTE — Telephone Encounter (Signed)
YEs he can complete his ozempic before switching.  If denied then we will have to look at other alternatives.  Aaron Drake. Jerline Pain, MD 09/10/2022 11:25 AM

## 2022-09-10 NOTE — Telephone Encounter (Signed)
I called pt, let him know he can finish ozempic before switching to mounjaro. Pt did inform me that he got an e-mail today saying mounjaro shipped.

## 2022-09-24 ENCOUNTER — Ambulatory Visit (INDEPENDENT_AMBULATORY_CARE_PROVIDER_SITE_OTHER): Payer: 59 | Admitting: Family Medicine

## 2022-09-24 ENCOUNTER — Encounter: Payer: Self-pay | Admitting: Family Medicine

## 2022-09-24 VITALS — BP 121/72 | HR 85 | Temp 97.7°F | Ht 64.0 in | Wt 216.8 lb

## 2022-09-24 DIAGNOSIS — I152 Hypertension secondary to endocrine disorders: Secondary | ICD-10-CM

## 2022-09-24 DIAGNOSIS — N35912 Unspecified bulbous urethral stricture, male: Secondary | ICD-10-CM

## 2022-09-24 DIAGNOSIS — E119 Type 2 diabetes mellitus without complications: Secondary | ICD-10-CM

## 2022-09-24 DIAGNOSIS — Z0001 Encounter for general adult medical examination with abnormal findings: Secondary | ICD-10-CM | POA: Diagnosis not present

## 2022-09-24 DIAGNOSIS — J4550 Severe persistent asthma, uncomplicated: Secondary | ICD-10-CM

## 2022-09-24 DIAGNOSIS — Z23 Encounter for immunization: Secondary | ICD-10-CM | POA: Diagnosis not present

## 2022-09-24 DIAGNOSIS — E1159 Type 2 diabetes mellitus with other circulatory complications: Secondary | ICD-10-CM | POA: Diagnosis not present

## 2022-09-24 DIAGNOSIS — K219 Gastro-esophageal reflux disease without esophagitis: Secondary | ICD-10-CM

## 2022-09-24 DIAGNOSIS — E785 Hyperlipidemia, unspecified: Secondary | ICD-10-CM

## 2022-09-24 DIAGNOSIS — E1169 Type 2 diabetes mellitus with other specified complication: Secondary | ICD-10-CM | POA: Diagnosis not present

## 2022-09-24 DIAGNOSIS — N35919 Unspecified urethral stricture, male, unspecified site: Secondary | ICD-10-CM | POA: Insufficient documentation

## 2022-09-24 LAB — LIPID PANEL
Cholesterol: 203 mg/dL — ABNORMAL HIGH (ref 0–200)
HDL: 59 mg/dL (ref >39.00)
LDL Cholesterol: 117 mg/dL — ABNORMAL HIGH (ref 0–99)
NonHDL: 143.97
Total CHOL/HDL Ratio: 3
Triglycerides: 136 mg/dL (ref 0.0–149.0)
VLDL: 27.2 mg/dL (ref 0.0–40.0)

## 2022-09-24 LAB — CBC
HCT: 43.3 % (ref 39.0–52.0)
Hemoglobin: 14.5 g/dL (ref 13.0–17.0)
MCHC: 33.5 g/dL (ref 30.0–36.0)
MCV: 87.1 fl (ref 78.0–100.0)
Platelets: 271 10*3/uL (ref 150.0–400.0)
RBC: 4.97 Mil/uL (ref 4.22–5.81)
RDW: 13.9 % (ref 11.5–15.5)
WBC: 9.7 10*3/uL (ref 4.0–10.5)

## 2022-09-24 LAB — COMPREHENSIVE METABOLIC PANEL
ALT: 30 U/L (ref 0–53)
AST: 17 U/L (ref 0–37)
Albumin: 4.4 g/dL (ref 3.5–5.2)
Alkaline Phosphatase: 65 U/L (ref 39–117)
BUN: 17 mg/dL (ref 6–23)
CO2: 26 mEq/L (ref 19–32)
Calcium: 10.1 mg/dL (ref 8.4–10.5)
Chloride: 102 mEq/L (ref 96–112)
Creatinine, Ser: 0.75 mg/dL (ref 0.40–1.50)
GFR: 93.07 mL/min (ref >60.00)
Glucose, Bld: 94 mg/dL (ref 70–99)
Potassium: 4.2 mEq/L (ref 3.5–5.1)
Sodium: 137 mEq/L (ref 135–145)
Total Bilirubin: 0.3 mg/dL (ref 0.2–1.2)
Total Protein: 6.7 g/dL (ref 6.0–8.3)

## 2022-09-24 LAB — MICROALBUMIN / CREATININE URINE RATIO
Creatinine,U: 72.5 mg/dL
Microalb Creat Ratio: 2.4 mg/g (ref 0.0–30.0)
Microalb, Ur: 1.7 mg/dL (ref 0.0–1.9)

## 2022-09-24 LAB — HEMOGLOBIN A1C: Hgb A1c MFr Bld: 6.4 % (ref 4.6–6.5)

## 2022-09-24 LAB — TESTOSTERONE: Testosterone: 152.38 ng/dL — ABNORMAL LOW (ref 300.00–890.00)

## 2022-09-24 LAB — TSH: TSH: 1.36 u[IU]/mL (ref 0.35–5.50)

## 2022-09-24 NOTE — Assessment & Plan Note (Signed)
He will be seen to Mounjaro 15 mg weekly tomorrow.  He has previously been on Ozempic 2 mg weekly.  He has been tolerating well.  We are checking A1c today.  We will recheck again in 6 months

## 2022-09-24 NOTE — Assessment & Plan Note (Signed)
Weight stable since last visit.  BMI 37 with comorbidities.  We will be switching to [facility] as above.  If this continues to be an issue would consider referral to medical weight management.

## 2022-09-24 NOTE — Assessment & Plan Note (Signed)
Continue management per urology.  Recently had dilation which seems to work well though symptoms seem to be worsening a little bit more recently.

## 2022-09-24 NOTE — Assessment & Plan Note (Signed)
Check lipids.  Cannot tolerate statins due to myalgias.

## 2022-09-24 NOTE — Progress Notes (Signed)
Chief Complaint:  Aaron Drake is a 66 y.o. male who presents today for his annual comprehensive physical exam.    Assessment/Plan:  Chronic Problems Addressed Today: Type 2 diabetes mellitus without complication, without long-term current use of insulin ([place]) He will be seen to Mounjaro 15 mg weekly tomorrow.  He has previously been on Ozempic 2 mg weekly.  He has been tolerating well.  We are checking A1c today.  We will recheck again in 6 months  Hyperlipidemia associated with type 2 diabetes mellitus ([place]) Check lipids.  Cannot tolerate statins due to myalgias.  Urethral stricture Continue management per urology.  Recently had dilation which seems to work well though symptoms seem to be worsening a little bit more recently.  Hypertension associated with diabetes ([place]) At goal today without medications.  Morbid obesity ([place]) Weight stable since last visit.  BMI 37 with comorbidities.  We will be switching to [facility] as above.  If this continues to be an issue would consider referral to medical weight management.  Asthma, severe persistent, well-controlled Continue management per allergist.  He is currently on Flovent and albuterol.  Gastroesophageal reflux disease Stable on Prilosec 20 mg daily.  Preventative Healthcare: Check labs.  Flu shot given today.Up-to-date on colon cancer screening.  Up-to-date on other vaccines.  Patient Counseling(The following topics were reviewed and/or handout was given):  -Nutrition: Stressed importance of moderation in sodium/caffeine intake, saturated fat and cholesterol, caloric balance, sufficient intake of fresh fruits, vegetables, and fiber.  -Stressed the importance of regular exercise.   -Substance Abuse: Discussed cessation/primary prevention of tobacco, alcohol, or other drug use; driving or other dangerous activities under the influence; availability of treatment for abuse.   -Injury prevention: Discussed safety belts, safety  helmets, smoke detector, smoking near bedding or upholstery.   -Sexuality: Discussed sexually transmitted diseases, partner selection, use of condoms, avoidance of unintended pregnancy and contraceptive alternatives.   -Dental health: Discussed importance of regular tooth brushing, flossing, and dental visits.  -Health maintenance and immunizations reviewed. Please refer to Health maintenance section.  Return to care in 1 year for next preventative visit.     Subjective:  HPI:  He has no acute complaints today.   Lifestyle Diet: Balanced.  Exercise: Gets plenty of steps.      09/24/2022   10:29 AM  Depression screen PHQ 2/9  Decreased Interest 0  Down, Depressed, Hopeless 0  PHQ - 2 Score 0    Health Maintenance Due  Topic Date Due   Diabetic kidney evaluation - Urine ACR  07/05/2019   FOOT EXAM  10/31/2019   Diabetic kidney evaluation - GFR measurement  09/21/2022     ROS: Per HPI, otherwise a complete review of systems was negative.   PMH:  The following were reviewed and entered/updated in epic: Past Medical History:  Diagnosis Date   Allergy    Arthritis    Asthma 05/12/2012   BPH (benign prostatic hyperplasia) 05/19/2012   Diabetes mellitus without complication ([place])    Dyspnea    physical stress; exercise    History of kidney stones    Hyperlipidemia    Impaired glucose tolerance 05/19/2012   Tear of meniscus of right knee 2018   partial tear   Patient Active Problem List   Diagnosis Date Noted   Urethral stricture 09/24/2022   Left sided sciatica 09/21/2021   Asthma, severe persistent, well-controlled 10/30/2019   Other allergic rhinitis 10/30/2019   Other atopic dermatitis 10/30/2019   Gastroesophageal reflux  disease 10/30/2019   Hypertension associated with diabetes ([place]) 16/60/6301   Erosive oral lichen planus, Dx via Bx, Tx with Dexamethasone rinse, followed by Periodontist 01/28/2019   Primary osteoarthritis of right knee 08/15/2018   Myalgia  due to statin 07/04/2018   Renal calculi 06/21/2018   Morbid obesity ([place]) 03/26/2017   Type 2 diabetes mellitus without complication, without long-term current use of insulin ([place]) 03/26/2017   BPH (benign prostatic hyperplasia) 05/19/2012   Hyperlipidemia associated with type 2 diabetes mellitus [facility])    Past Surgical History:  Procedure Laterality Date   BALLOON DILATION N/A 06/10/2022   Procedure: URETHRAL BALLOON DILATION-OPTILUME;  Surgeon: Ardis Hughs, MD;  Location: [facility];  Service: Urology;  Laterality: N/A;   CATARACT EXTRACTION Right    CYSTOSCOPY WITH RETROGRADE URETHROGRAM N/A 06/10/2022   Procedure: CYSTOSCOPY WITH RETROGRAM URETHROGRAM;  Surgeon: Ardis Hughs, MD;  Location: [facility];  Service: Urology;  Laterality: N/A;   EXTRACORPOREAL SHOCK WAVE LITHOTRIPSY Left 05/08/2018   Procedure: LEFT EXTRACORPOREAL SHOCK WAVE LITHOTRIPSY (ESWL);  Surgeon: [name], MD;  Location: [name];  Service: Urology;  Laterality: Left;   INSERTION OF MESH N/A 05/17/2016   Procedure: INSERTION OF MESH;  Surgeon: Rolm Bookbinder, MD;  Location: [place];  Service: General;  Laterality: N/A;   UMBILICAL HERNIA REPAIR N/A 05/17/2016   Procedure: UMBILICAL HERNIA REPAIR;  Surgeon: Rolm Bookbinder, MD;  Location: [place];  Service: General;  Laterality: N/A;   WISDOM TOOTH EXTRACTION      Family History  Problem Relation Age of Onset   Heart disease Mother    Lung cancer Mother    Stroke Father    COPD Father        Smoker   Aortic aneurysm Father    Colon cancer Maternal Uncle     Medications- reviewed and updated Current Outpatient Medications  Medication Sig Dispense Refill   albuterol (VENTOLIN HFA) 108 (90 Base) MCG/ACT inhaler USE 2 INHALATIONS ORALLY   EVERY 4 HOURS AS NEEDED FORWHEEZING OR SHORTNESS OF   BREATH 3 each 1   Calcium Carb-Cholecalciferol (CALCIUM + D3 PO) Take 1 tablet by mouth daily.     EPINEPHrine (EPIPEN  2-PAK) 0.3 mg/0.3 mL IJ SOAJ injection Inject 0.3 mg into the muscle as needed for anaphylaxis. 1 each 1   famotidine (PEPCID) 40 MG tablet Take 1 tablet (40 mg total) by mouth at bedtime. 90 tablet 1   fluticasone (FLOVENT HFA) 110 MCG/ACT inhaler Inhale 2 puffs into the lungs daily. 36 g 1   meclizine (ANTIVERT) 12.5 MG tablet Take 1 tablet by mouth every 6 hours if needed for vertigo 180 tablet 0   montelukast (SINGULAIR) 10 MG tablet Take 1 tablet (10 mg total) by mouth at bedtime. 90 tablet 1   Multiple Vitamin (MULTIVITAMIN WITH MINERALS) TABS tablet Take 1 tablet by mouth daily.     omeprazole (PRILOSEC) 20 MG capsule Take 1 capsule (20 mg total) by mouth in the morning. 90 capsule 1   phenazopyridine (PYRIDIUM) 200 MG tablet Take 1 tablet (200 mg total) by mouth 3 (three) times daily as needed for pain. 10 tablet 0   tirzepatide (MOUNJARO) 15 MG/0.5ML Pen Inject 15 mg into the skin once a week. 6 mL 0   Current Facility-Administered Medications  Medication Dose Route Frequency Provider Last Rate Last Admin   mepolizumab (NUCALA) injection 100 mg  100 mg Subcutaneous Q28 days Valentina Shaggy, MD   100 mg  at 02/25/22 1921   tezepelumab-ekko (TEZSPIRE) 210 MG/1.91ML syringe 210 mg  210 mg Subcutaneous Q28 days Jiles Prows, MD   210 mg at 05/20/22 1129    Allergies-reviewed and updated Allergies  Allergen Reactions   Airduo Digihaler [Fluticasone-Salmeterol] Other (See Comments)    Redness -Head to toe; skin Peeling - Head to to   Lipitor [Atorvastatin] Other (See Comments)    Muscle cramps and joint ache    Social History   Socioeconomic History   Marital status: Single    Spouse name: Not on file   Number of children: Not on file   Years of education: Not on file   Highest education level: Not on file  Occupational History   Occupation: catholic priest  Tobacco Use   Smoking status: Former    Types: Cigarettes    Quit date: 1998    Years since quitting: 25.8    Smokeless tobacco: Never  Vaping Use   Vaping Use: Never used  Substance and Sexual Activity   Alcohol use: Yes    Alcohol/week: 1.0 standard drink of alcohol    Types: 1 Shots of liquor per week    Comment: rare   Drug use: No   Sexual activity: Not Currently  Other Topics Concern   Not on file  Social History Narrative   Not on file   Social Determinants of Health   Financial Resource Strain: Not on file  Food Insecurity: Not on file  Transportation Needs: Not on file  Physical Activity: Not on file  Stress: Not on file  Social Connections: Not on file        Objective:  Physical Exam: BP 121/72   Pulse 85   Temp 97.7 F (36.5 C) (Temporal)   Ht '5\' 4"'$  (1.626 m)   Wt 216 lb 12.8 oz (98.3 kg)   SpO2 95%   BMI 37.21 kg/m   Body mass index is 37.21 kg/m. Wt Readings from Last 3 Encounters:  09/24/22 216 lb 12.8 oz (98.3 kg)  07/20/22 214 lb 6.4 oz (97.3 kg)  06/10/22 213 lb 1.6 oz (96.7 kg)   Gen: NAD, resting comfortably HEENT: TMs normal bilaterally. OP clear. No thyromegaly noted.  CV: RRR with no murmurs appreciated Pulm: NWOB, CTAB with no crackles, wheezes, or rhonchi GI: Normal bowel sounds present. Soft, Nontender, Nondistended. MSK: no edema, cyanosis, or clubbing noted Skin: warm, dry Neuro: CN2-12 grossly intact. Strength 5/5 in upper and lower extremities. Reflexes symmetric and intact bilaterally.  Psych: Normal affect and thought content     Verdia Bolt M. Jerline Pain, MD 09/24/2022 11:16 AM

## 2022-09-24 NOTE — Patient Instructions (Signed)
It was very nice to see you today!  We will check blood work today.  We gave you a flu shot today.  Please let me know how you are doing with the [facility] in a few weeks.  I will probably see you back in 6 months to recheck your A1c.  We may need to have you come back sooner depending on your labs.  Please come back sooner if needed.  Take care, Dr Jerline Pain  PLEASE NOTE:  If you had any lab tests please let us know if you have not heard back within a few days. You may see your results on mychart before we have a chance to review them but we will give you a call once they are reviewed by [place]. If we ordered any referrals today, please let us know if you have not heard from their office within the next week.   Please try these tips to maintain a healthy lifestyle:  Eat at least 3 REAL meals and 1-2 snacks per day.  Aim for no more than 5 hours between eating.  If you eat breakfast, please do so within one hour of getting up.   Each meal should contain half fruits/vegetables, one quarter protein, and one quarter carbs (no bigger than a computer mouse)  Cut down on sweet beverages. This includes juice, soda, and sweet tea.   Drink at least 1 glass of water with each meal and aim for at least 8 glasses per day  Exercise at least 150 minutes every week.    Preventive Care 94 Years and Older, Male Preventive care refers to lifestyle choices and visits with your health care provider that can promote health and wellness. Preventive care visits are also called wellness exams. What can I expect for my preventive care visit? Counseling During your preventive care visit, your health care provider may ask about your: Medical history, including: Past medical problems. Family medical history. History of falls. Current health, including: Emotional well-being. Home life and relationship well-being. Sexual activity. Memory and ability to understand (cognition). Lifestyle, including: Alcohol,  nicotine or tobacco, and drug use. Access to firearms. Diet, exercise, and sleep habits. Work and work Statistician. Sunscreen use. Safety issues such as seatbelt and bike helmet use. Physical exam Your health care provider will check your: Height and weight. These may be used to calculate your BMI (body mass index). BMI is a measurement that tells if you are at a healthy weight. Waist circumference. This measures the distance around your waistline. This measurement also tells if you are at a healthy weight and may help predict your risk of certain diseases, such as type 2 diabetes and high blood pressure. Heart rate and blood pressure. Body temperature. Skin for abnormal spots. What immunizations do I need?  Vaccines are usually given at various ages, according to a schedule. Your health care provider will recommend vaccines for you based on your age, medical history, and lifestyle or other factors, such as travel or where you work. What tests do I need? Screening Your health care provider may recommend screening tests for certain conditions. This may include: Lipid and cholesterol levels. Diabetes screening. This is done by checking your blood sugar (glucose) after you have not eaten for a while (fasting). Hepatitis C test. Hepatitis B test. HIV (human immunodeficiency virus) test. STI (sexually transmitted infection) testing, if you are at risk. Lung cancer screening. Colorectal cancer screening. Prostate cancer screening. Abdominal aortic aneurysm (AAA) screening. You may need this if you  are a current or former smoker. Talk with your health care provider about your test results, treatment options, and if necessary, the need for more tests. Follow these instructions at home: Eating and drinking  Eat a diet that includes fresh fruits and vegetables, whole grains, lean protein, and low-fat dairy products. Limit your intake of foods with high amounts of sugar, saturated fats, and  salt. Take vitamin and mineral supplements as recommended by your health care provider. Do not drink alcohol if your health care provider tells you not to drink. If you drink alcohol: Limit how much you have to 0-2 drinks a day. Know how much alcohol is in your drink. In the [place] one drink equals one 12 oz bottle of beer (355 mL), one 5 oz glass of wine (148 mL), or one 1 oz glass of hard liquor (44 mL). Lifestyle Brush your teeth every morning and night with fluoride toothpaste. Floss one time each day. Exercise for at least 30 minutes 5 or more days each week. Do not use any products that contain nicotine or tobacco. These products include cigarettes, chewing tobacco, and vaping devices, such as e-cigarettes. If you need help quitting, ask your health care provider. Do not use drugs. If you are sexually active, practice safe sex. Use a condom or other form of protection to prevent STIs. Take aspirin only as told by your health care provider. Make sure that you understand how much to take and what form to take. Work with your health care provider to find out whether it is safe and beneficial for you to take aspirin daily. Ask your health care provider if you need to take a cholesterol-lowering medicine (statin). Find healthy ways to manage stress, such as: Meditation, yoga, or listening to music. Journaling. Talking to a trusted person. Spending time with friends and family. Safety Always wear your seat belt while driving or riding in a vehicle. Do not drive: If you have been drinking alcohol. Do not ride with someone who has been drinking. When you are tired or distracted. While texting. If you have been using any mind-altering substances or drugs. Wear a helmet and other protective equipment during sports activities. If you have firearms in your house, make sure you follow all gun safety procedures. Minimize exposure to UV radiation to reduce your risk of skin cancer. What's  next? Visit your health care provider once a year for an annual wellness visit. Ask your health care provider how often you should have your eyes and teeth checked. Stay up to date on all vaccines. This information is not intended to replace advice given to you by your health care provider. Make sure you discuss any questions you have with your health care provider. Document Revised: 05/13/2021 Document Reviewed: 05/13/2021 Elsevier Patient Education  [place].

## 2022-09-24 NOTE — Assessment & Plan Note (Signed)
Stable on Prilosec 20 mg daily.

## 2022-09-24 NOTE — Assessment & Plan Note (Signed)
At goal today without medications.

## 2022-09-24 NOTE — Assessment & Plan Note (Signed)
Continue management per allergist.  He is currently on Flovent and albuterol.

## 2022-09-27 ENCOUNTER — Encounter: Payer: Self-pay | Admitting: Family Medicine

## 2022-09-28 NOTE — Progress Notes (Signed)
Please inform patient of the following:  His blood sugar is up a little bit but still at goal.  We can continue his current dose of Mounjaro and recheck in 6 months. His cholesterol still borderline elevated.  He may benefit from starting a low-dose statin to improve these numbers and low risk of heart attack and stroke.  I know he has had issues with Lipitor in the past.  Recommend starting simvastatin 20 mg daily and we can also recheck this in about 6 months.  This is a much lower dose that he had previously and should not cause issues with the muscle pain.  Testosterone is low.  Recommend he come back in a couple weeks to recheck if he is interested in replacement therapy.  Everything else is normal and we can recheck in a year.

## 2022-09-29 ENCOUNTER — Other Ambulatory Visit: Payer: Self-pay | Admitting: *Deleted

## 2022-09-29 DIAGNOSIS — R7989 Other specified abnormal findings of blood chemistry: Secondary | ICD-10-CM

## 2022-09-29 NOTE — Progress Notes (Signed)
testo

## 2022-10-04 NOTE — Telephone Encounter (Signed)
Please see result note.  Aaron Drake. Jerline Pain, MD 10/04/2022 7:34 AM

## 2022-10-19 ENCOUNTER — Other Ambulatory Visit (INDEPENDENT_AMBULATORY_CARE_PROVIDER_SITE_OTHER): Payer: 59

## 2022-10-19 DIAGNOSIS — R7989 Other specified abnormal findings of blood chemistry: Secondary | ICD-10-CM | POA: Diagnosis not present

## 2022-10-19 LAB — TESTOSTERONE: Testosterone: 157.69 ng/dL — ABNORMAL LOW (ref 300.00–890.00)

## 2022-10-25 NOTE — Progress Notes (Signed)
Please inform patient of the following:  Testosterone still low. We can discuss at upcoming appointment.

## 2022-10-26 ENCOUNTER — Encounter: Payer: Self-pay | Admitting: Allergy and Immunology

## 2022-10-26 ENCOUNTER — Encounter: Payer: Self-pay | Admitting: Family Medicine

## 2022-10-26 ENCOUNTER — Ambulatory Visit: Payer: 59 | Admitting: Family Medicine

## 2022-10-26 VITALS — BP 120/70 | HR 97 | Temp 97.8°F | Ht 64.0 in | Wt 216.0 lb

## 2022-10-26 DIAGNOSIS — R338 Other retention of urine: Secondary | ICD-10-CM | POA: Diagnosis not present

## 2022-10-26 DIAGNOSIS — E119 Type 2 diabetes mellitus without complications: Secondary | ICD-10-CM | POA: Diagnosis not present

## 2022-10-26 DIAGNOSIS — N401 Enlarged prostate with lower urinary tract symptoms: Secondary | ICD-10-CM

## 2022-10-26 DIAGNOSIS — R7989 Other specified abnormal findings of blood chemistry: Secondary | ICD-10-CM | POA: Diagnosis not present

## 2022-10-26 MED ORDER — TESTOSTERONE 20.25 MG/ACT (1.62%) TD GEL: 1.0000 | Freq: Every day | TRANSDERMAL | 0 refills | Status: DC

## 2022-10-26 NOTE — Assessment & Plan Note (Signed)
Follows with urology.  Symptoms of been stable.

## 2022-10-26 NOTE — Progress Notes (Signed)
   Aaron Drake is a 66 y.o. male who presents today for an office visit.  Assessment/Plan:  Chronic Problems Addressed Today: Low testosterone Patient with to confirm low testosterone levels on most recent labs.  He is having issues with weight and low energy.  He is interested in testosterone replacement.  We discussed benefits of testosterone replacement including increased muscle mass and energy levels.  We also discussed risk of testosterone replacement including increased risk for prostate cancer, BPH, and adverse cardiovascular events.  We will start with AndroGel 1 pump daily.  He will come back in 3 months and we will recheck testosterone level with CBC and PSA.  BPH (benign prostatic hyperplasia) Follows with urology.  Symptoms of been stable.  Type 2 diabetes mellitus without complication, without long-term current use of insulin ([facility]) Last A1c at goal.  He has done well with transition to Mounjaro 15 mg daily.  He will come back in a few months and we can recheck A1c at that time.     Subjective:  HPI:  See A/p for status of chronic conditions.       Objective:  Physical Exam: BP 120/70   Pulse 97   Temp 97.8 F (36.6 C) (Temporal)   Ht '5\' 4"'$  (1.626 m)   Wt 216 lb (98 kg)   SpO2 94%   BMI 37.08 kg/m   Gen: No acute distress, resting comfortably Neuro: Grossly normal, moves all extremities Psych: Normal affect and thought content      Aaron Drake M. Jerline Pain, MD 10/26/2022 3:13 PM

## 2022-10-26 NOTE — Assessment & Plan Note (Signed)
Patient with to confirm low testosterone levels on most recent labs.  He is having issues with weight and low energy.  He is interested in testosterone replacement.  We discussed benefits of testosterone replacement including increased muscle mass and energy levels.  We also discussed risk of testosterone replacement including increased risk for prostate cancer, BPH, and adverse cardiovascular events.  We will start with AndroGel 1 pump daily.  He will come back in 3 months and we will recheck testosterone level with CBC and PSA.

## 2022-10-26 NOTE — Patient Instructions (Signed)
It was very nice to see you today!  We will start testosterone replacement.  Please come back in next week recheck labs.  I will see you back in April for your diabetes follow-up.  Take care, Dr Jerline Pain  PLEASE NOTE:  If you had any lab tests please let us know if you have not heard back within a few days. You may see your results on mychart before we have a chance to review them but we will give you a call once they are reviewed by [place]. If we ordered any referrals today, please let us know if you have not heard from their office within the next week.   Please try these tips to maintain a healthy lifestyle:  Eat at least 3 REAL meals and 1-2 snacks per day.  Aim for no more than 5 hours between eating.  If you eat breakfast, please do so within one hour of getting up.   Each meal should contain half fruits/vegetables, one quarter protein, and one quarter carbs (no bigger than a computer mouse)  Cut down on sweet beverages. This includes juice, soda, and sweet tea.   Drink at least 1 glass of water with each meal and aim for at least 8 glasses per day  Exercise at least 150 minutes every week.

## 2022-10-26 NOTE — Assessment & Plan Note (Signed)
Last A1c at goal.  He has done well with transition to Mounjaro 15 mg daily.  He will come back in a few months and we can recheck A1c at that time.

## 2022-10-27 NOTE — Telephone Encounter (Signed)
Key: [id number] - PA Case ID: [id number] - Rx #: [id number] Status Sent to Plan today Drug Testosterone 20.25 MG/ACT(1.62%) gel Waiting for determination

## 2022-10-29 ENCOUNTER — Other Ambulatory Visit: Payer: Self-pay | Admitting: Family Medicine

## 2022-10-29 NOTE — Telephone Encounter (Signed)
PA testosterone Denied on November 29 Left message to return call to our office at their convenience.

## 2022-11-02 ENCOUNTER — Encounter: Payer: Self-pay | Admitting: Family Medicine

## 2022-11-02 MED ORDER — TESTOSTERONE 50 MG/5GM (1%) TD GEL: 5.0000 g | Freq: Every day | TRANSDERMAL | 3 refills | Status: DC

## 2022-11-09 ENCOUNTER — Encounter: Payer: Self-pay | Admitting: Allergy and Immunology

## 2022-11-09 ENCOUNTER — Ambulatory Visit: Payer: 59 | Admitting: Allergy and Immunology

## 2022-11-09 ENCOUNTER — Other Ambulatory Visit: Payer: Self-pay

## 2022-11-09 VITALS — BP 130/84 | HR 89 | Temp 98.3°F | Resp 16 | Ht 64.0 in | Wt 214.2 lb

## 2022-11-09 DIAGNOSIS — J4550 Severe persistent asthma, uncomplicated: Secondary | ICD-10-CM

## 2022-11-09 DIAGNOSIS — K5903 Drug induced constipation: Secondary | ICD-10-CM

## 2022-11-09 DIAGNOSIS — J3089 Other allergic rhinitis: Secondary | ICD-10-CM

## 2022-11-09 DIAGNOSIS — L2089 Other atopic dermatitis: Secondary | ICD-10-CM

## 2022-11-09 DIAGNOSIS — K219 Gastro-esophageal reflux disease without esophagitis: Secondary | ICD-10-CM | POA: Diagnosis not present

## 2022-11-09 MED ORDER — FAMOTIDINE 40 MG PO TABS: 40.0000 mg | ORAL_TABLET | Freq: Every evening | ORAL | 1 refills | Status: DC

## 2022-11-09 MED ORDER — MONTELUKAST SODIUM 10 MG PO TABS: 10.0000 mg | ORAL_TABLET | Freq: Every evening | ORAL | 1 refills | Status: DC

## 2022-11-09 MED ORDER — LORATADINE 10 MG PO TABS: 10.0000 mg | ORAL_TABLET | Freq: Every day | ORAL | 1 refills | Status: AC

## 2022-11-09 MED ORDER — ALBUTEROL SULFATE HFA 108 (90 BASE) MCG/ACT IN AERS: 2.0000 | INHALATION_SPRAY | RESPIRATORY_TRACT | 1 refills | Status: DC | PRN

## 2022-11-09 MED ORDER — OMEPRAZOLE 20 MG PO CPDR: 20.0000 mg | DELAYED_RELEASE_CAPSULE | Freq: Every morning | ORAL | 1 refills | Status: DC

## 2022-11-09 NOTE — Progress Notes (Unsigned)
[facility] [place] [facility] [place]   Follow-up Note  Referring Provider: Vivi Barrack, MD Primary Provider: Vivi Barrack, MD Date of Office Visit: 11/09/2022  Subjective:   Aaron Drake (DOB: 1955/12/12) is a 66 y.o. 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  His last visit to this clinic was 20 May 2022.  He has now been using anti-TSLP antibody for 6 months.  He believes that this is working very well.  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  He does use albuterol inhaler at least twice a day.  He is very satisfied with the response he has received on his current plan.  He has not required a systemic steroid to treat an exacerbation of her asthma.  He believes that his reflux is going quite well.  However, he has developed some issues with constipation and he thinks that it might be related to one of his reflux medicines.  He had very little problems with his nose.  He had very little problems with his skin.  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  He will be starting testosterone supplementation at some point in the near future.  Allergies as of 11/09/2022       Reactions   Airduo Digihaler [fluticasone-salmeterol] Other (See Comments)   Redness -Head to toe; skin Peeling - Head to to   Lipitor [atorvastatin] Other (See Comments)   Muscle cramps and joint ache        Medication List    albuterol 108 (90 Base) MCG/ACT inhaler Commonly known as: VENTOLIN HFA USE 2 INHALATIONS ORALLY    EVERY 4 HOURS AS NEEDED FORWHEEZING OR SHORTNESS OF   BREATH   CALCIUM + D3 PO Take 1 tablet by mouth daily.   EPINEPHrine 0.3 mg/0.3 mL Soaj injection Commonly known as: EpiPen 2-Pak Inject 0.3 mg into the muscle as needed for anaphylaxis.   famotidine 40 MG tablet Commonly known as: PEPCID Take 1 tablet (40 mg total) by mouth at bedtime.   fluticasone 110 MCG/ACT inhaler Commonly known as: Flovent HFA Inhale 2 puffs into the lungs daily.   meclizine 12.5 MG tablet Commonly known as: ANTIVERT Take 1 tablet by mouth every 6 hours if needed for vertigo   montelukast 10 MG tablet Commonly known as: SINGULAIR Take 1 tablet (10 mg total) by mouth at bedtime.   Mounjaro 15 MG/0.5ML Pen Generic drug: tirzepatide INJECT 0.5ML (='15MG'$ )       SUBCUTANEOUSLY WEEKLY      (EVERY 7 DAYS)   multivitamin with minerals Tabs tablet Take 1 tablet by mouth daily.   omeprazole 20 MG capsule Commonly known as: PRILOSEC Take 1 capsule (20 mg total) by mouth in the morning.   phenazopyridine 200 MG tablet Commonly known as: Pyridium Take 1 tablet (200 mg total) by mouth 3 (three) times daily as needed for pain.   testosterone 50 MG/5GM (1%) Gel Commonly known as: Testim Place 5 g onto the skin daily.    Past Medical History:  Diagnosis Date   Allergy    Arthritis    Asthma 05/12/2012   BPH (benign prostatic hyperplasia) 05/19/2012   Diabetes mellitus without complication ([place])    Dyspnea    physical stress; exercise    History of kidney stones    Hyperlipidemia    Impaired glucose tolerance 05/19/2012   Tear of meniscus of right knee 2018   partial tear    Past Surgical History:  Procedure Laterality Date   BALLOON DILATION N/A 06/10/2022   Procedure: URETHRAL BALLOON DILATION-OPTILUME;  Surgeon: Ardis Hughs, MD;  Location: [facility];  Service: Urology;  Laterality: N/A;   CATARACT EXTRACTION Right    CYSTOSCOPY WITH RETROGRADE URETHROGRAM N/A 06/10/2022    Procedure: CYSTOSCOPY WITH RETROGRAM URETHROGRAM;  Surgeon: Ardis Hughs, MD;  Location: [facility];  Service: Urology;  Laterality: N/A;   EXTRACORPOREAL SHOCK WAVE LITHOTRIPSY Left 05/08/2018   Procedure: LEFT EXTRACORPOREAL SHOCK WAVE LITHOTRIPSY (ESWL);  Surgeon: [name], MD;  Location: [name];  Service: Urology;  Laterality: Left;   INSERTION OF MESH N/A 05/17/2016   Procedure: INSERTION OF MESH;  Surgeon: Rolm Bookbinder, MD;  Location: [place];  Service: General;  Laterality: N/A;   UMBILICAL HERNIA REPAIR N/A 05/17/2016   Procedure: UMBILICAL HERNIA REPAIR;  Surgeon: Rolm Bookbinder, MD;  Location: [place];  Service: General;  Laterality: N/A;   WISDOM TOOTH EXTRACTION      Review of systems negative except as noted in HPI / PMHx or noted below:  Review of Systems  Constitutional: Negative.   HENT: Negative.    Eyes: Negative.   Respiratory: Negative.    Cardiovascular: Negative.   Gastrointestinal: Negative.   Genitourinary: Negative.   Musculoskeletal: Negative.   Skin: Negative.   Neurological: Negative.   Endo/Heme/Allergies: Negative.   Psychiatric/Behavioral: Negative.       Objective:   Vitals:   11/09/22 0827  BP: 130/84  Pulse: 89  Resp: 16  Temp: 98.3 F (36.8 C)  SpO2: 95%   Height: '5\' 4"'$  (162.6 cm)  Weight: 214 lb 3.2 oz (97.2 kg)   Physical Exam Constitutional:      Appearance: He is not diaphoretic.  HENT:     Head: Normocephalic.     Right Ear: Tympanic membrane, ear canal and external ear normal.     Left Ear: Tympanic membrane, ear canal and external ear normal.     Nose: Nose normal. No mucosal edema or rhinorrhea.     Mouth/Throat:     Pharynx: Uvula midline. No oropharyngeal exudate.  Eyes:     Conjunctiva/sclera: Conjunctivae normal.  Neck:     Thyroid: No thyromegaly.     Trachea: Trachea normal. No tracheal tenderness or tracheal deviation.  Cardiovascular:     Rate and Rhythm: Normal rate and  regular rhythm.     Heart sounds: Normal heart sounds, S1 normal and S2 normal. No murmur heard. Pulmonary:     Effort: No respiratory distress.     Breath sounds: Normal breath sounds. No stridor. No wheezing or rales.  Lymphadenopathy:     Head:     Right side of head: No tonsillar adenopathy.     Left side of head: No tonsillar adenopathy.     Cervical: No cervical adenopathy.  Skin:    Findings: No erythema or rash.     Nails: There is no clubbing.  Neurological:     Mental Status: He is alert.     Diagnostics:  Spirometry was performed and demonstrated an FEV1 of 1.37 at 52 % of predicted.  Assessment and Plan:   1. Asthma, severe persistent, well-controlled   2. Other atopic dermatitis   3. Other allergic rhinitis   4. Gastroesophageal reflux disease, unspecified whether esophagitis present   5. Drug-induced constipation    1. Continue tezepelumab injections  2. Continue montelukast 10 mg -1 tablet 1 time per day  4. Continue Omeprazole 20 mg in AM + famotidine 40 mg in PM.  Determine which of these medications may be causing constipation.  5. Continue Claritin and Albuterol HFA and topical treatment if needed.  6. 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  He needs to work through whether or not omeprazole or famotidine is contributing to his constipation and he can do that by discontinuing 1 of these agents for a period in time.  Assuming he does well with this plan I will see him back in this clinic in 6 months or earlier if there is a problem.  Allena Katz, MD Allergy / Immunology [place]

## 2022-11-09 NOTE — Patient Instructions (Signed)
  1. Continue tezepelumab injections  2. Continue montelukast 10 mg -1 tablet 1 time per day  4. Continue Omeprazole 20 mg in AM + famotidine 40 mg in PM.  Determine which of these medications may be causing constipation.  5. Continue Claritin and Albuterol HFA and topical treatment if needed.  6. Return to clinic in 6 months or earlier if problem

## 2022-11-10 ENCOUNTER — Other Ambulatory Visit: Payer: Self-pay | Admitting: Allergy and Immunology

## 2022-11-10 ENCOUNTER — Other Ambulatory Visit: Payer: Self-pay | Admitting: *Deleted

## 2022-11-10 ENCOUNTER — Encounter: Payer: Self-pay | Admitting: Allergy and Immunology

## 2022-11-10 ENCOUNTER — Other Ambulatory Visit: Payer: Self-pay

## 2022-11-10 MED ORDER — ALBUTEROL SULFATE HFA 108 (90 BASE) MCG/ACT IN AERS: 2.0000 | INHALATION_SPRAY | RESPIRATORY_TRACT | 1 refills | Status: DC | PRN

## 2022-11-10 MED ORDER — TESTOSTERONE 50 MG/5GM (1%) TD GEL: 5.0000 g | Freq: Every day | TRANSDERMAL | 3 refills | Status: DC

## 2022-11-10 NOTE — Telephone Encounter (Signed)
Please resend Prescription to [facility]

## 2022-11-12 ENCOUNTER — Encounter: Payer: Self-pay | Admitting: Allergy and Immunology

## 2022-11-16 MED ORDER — TESTOSTERONE 50 MG/5GM (1%) TD GEL: 5.0000 g | Freq: Every day | TRANSDERMAL | 3 refills | Status: DC

## 2022-11-16 NOTE — Telephone Encounter (Signed)
Called [facility]/[phone] -spoke to [place] CSR - DOB verified - Agent stated patient's insurance covers the following:   Albuterol Sulfate CFC Free Aersol Levo albuterol Tartrate  Forwarding message to provider for next step.

## 2022-11-16 NOTE — Addendum Note (Signed)
Addended by: Betti Cruz on: 11/16/2022 01:39 PM   Modules accepted: Orders

## 2022-11-19 ENCOUNTER — Other Ambulatory Visit: Payer: Self-pay

## 2022-11-19 MED ORDER — ALBUTEROL SULFATE HFA 108 (90 BASE) MCG/ACT IN AERS: 2.0000 | INHALATION_SPRAY | RESPIRATORY_TRACT | 5 refills | Status: DC | PRN

## 2022-11-19 NOTE — Telephone Encounter (Signed)
Reviewed w/ Dr. Nelva Bush and site manager --  Called patient - DOB verified - explained/advised of below notation. Patient states he has enough puffs to last him until Wednesday, 11/24/22 - as the office will be closed for the Christmas holidays - 12/25 - 26.  Patient advised, we will try to send in the Albuterol Sulfate CFC free Aerosol to [facility] notating specifically that the [facility] free aerosol in the pharmacy notation area.  Patient verbalized understanding, no further questions.

## 2022-11-19 NOTE — Telephone Encounter (Signed)
Albuterol Sulfate CFC Free Aersol

## 2022-11-24 ENCOUNTER — Other Ambulatory Visit: Payer: Self-pay

## 2022-11-25 ENCOUNTER — Telehealth: Payer: Self-pay

## 2022-11-25 MED ORDER — ALBUTEROL SULFATE HFA 108 (90 BASE) MCG/ACT IN AERS: 2.0000 | INHALATION_SPRAY | RESPIRATORY_TRACT | 1 refills | Status: DC | PRN

## 2022-11-25 NOTE — Telephone Encounter (Addendum)
Called [facility]/[phone] - spoke to [place], Pharmacy Technician - DOB verified - stated Ventolin is no longer covered by patient's plan; Proventil or Pro Air is covered. Mayra stated last dispersement was in November for a 3 month supply - patient next dispersement would not be until 12/12/22.  Sending in prescription for Proventil to [facility] - prescription will be filled on 12/12/2022.  Called patient - DOB verified - advised of the above notation. Patient stated he actually has 1 Ventolin inhaler left that he has not even opened yet.   Patient verbalized understanding, no further questions.

## 2022-11-25 NOTE — Telephone Encounter (Signed)
Please disregard this note - refer to 11/12/22 documentation. Thanks.

## 2022-11-25 NOTE — Addendum Note (Signed)
Addended by: Tommas Olp B on: 11/25/2022 06:17 PM   Modules accepted: Orders

## 2022-12-14 ENCOUNTER — Other Ambulatory Visit: Payer: Self-pay | Admitting: *Deleted

## 2022-12-14 MED ORDER — TESTOSTERONE 50 MG/5GM (1%) TD GEL: 5.0000 g | Freq: Every day | TRANSDERMAL | 3 refills | Status: DC

## 2022-12-14 NOTE — Telephone Encounter (Signed)
Rx was send to pharmacy  

## 2022-12-21 ENCOUNTER — Encounter: Payer: Self-pay | Admitting: Family Medicine

## 2022-12-21 MED ORDER — TESTOSTERONE 12.5 MG/ACT (1%) TD GEL: 1.0000 | Freq: Every day | TRANSDERMAL | 5 refills | Status: AC

## 2022-12-21 NOTE — Telephone Encounter (Signed)
See note

## 2023-01-13 ENCOUNTER — Other Ambulatory Visit: Payer: Self-pay | Admitting: Family Medicine

## 2023-02-09 ENCOUNTER — Other Ambulatory Visit: Payer: Self-pay | Admitting: Family Medicine

## 2023-02-09 MED ORDER — MOUNJARO 15 MG/0.5ML ~~LOC~~ SOAJ: 15.0000 mg | SUBCUTANEOUS | 0 refills | Status: DC

## 2023-02-11 ENCOUNTER — Other Ambulatory Visit: Payer: Self-pay | Admitting: Family Medicine

## 2023-02-11 ENCOUNTER — Telehealth: Payer: Self-pay | Admitting: *Deleted

## 2023-02-11 NOTE — Telephone Encounter (Signed)
Spoke to pt told him received message for [facility] that Mounjaro 15 mg is on back order and asking for replacement. Told him I called local pharmacy and they do not have any either. Told him Dr. Jerline Pain said okay to change to Mounjaro 12.5 mg so I sent this to [facility] for you. Pt verbalized understanding.

## 2023-02-14 ENCOUNTER — Other Ambulatory Visit: Payer: Self-pay | Admitting: Family Medicine

## 2023-02-15 NOTE — Telephone Encounter (Signed)
Please check with pharmacy for what is available.  Aaron Drake. Jerline Pain, MD 02/15/2023 12:20 PM

## 2023-02-15 NOTE — Telephone Encounter (Signed)
Pharmacy comment: MOUNJARO (4) PEN 12.5/0.5 is on back order.   To prescribe an alternate, please respond with appropriate changes. MOUNJARO (4) PEN 12.5/0.5 is on back order.   To prescribe an alternate, please respond with appropriate changes.

## 2023-02-17 ENCOUNTER — Encounter: Payer: Self-pay | Admitting: Family Medicine

## 2023-02-17 NOTE — Telephone Encounter (Signed)
Aaron Drake, Certified pharmacy technician states: - Mounjaro 12.5 mg, 10 mg , and 15 mg on manufacture back order; All other dosages available  - Needs to know if pcp wanted to change dosage strength or substitute medication; They have wegovy  Aaron Drake can be contacted @ [phone], option 2. Reference #: [id number]

## 2023-02-17 NOTE — Telephone Encounter (Signed)
We can try wegovy but I am not sure the insurance will pay for it. Ok to send in for 2.4mg  weekly dose.  If they will not pay for then we can go to 7.5mg  weekly dose until the shortage ends.  Algis Greenhouse. Jerline Pain, MD 02/17/2023 12:10 PM

## 2023-02-18 ENCOUNTER — Other Ambulatory Visit: Payer: Self-pay

## 2023-02-18 MED ORDER — SEMAGLUTIDE-WEIGHT MANAGEMENT 2.4 MG/0.75ML ~~LOC~~ SOAJ: 2.4000 mg | SUBCUTANEOUS | 1 refills | Status: DC

## 2023-02-18 NOTE — Telephone Encounter (Signed)
Patient aware that WEGOVY 2.4 mg sent into [facility]. States that he does have 3 pens of Mounjaro left.

## 2023-02-19 ENCOUNTER — Other Ambulatory Visit: Payer: Self-pay | Admitting: Family Medicine

## 2023-02-28 NOTE — Telephone Encounter (Signed)
Just as an Cristina Gong not covered under pr insurance

## 2023-03-01 NOTE — Telephone Encounter (Signed)
Is there a way to check to see what is in stock? Trulocity 4.5mg  weekly will probably be the closest to what he was on before. Ok to send in if they have it in [place].  Algis Greenhouse. Jerline Pain, MD 03/01/2023 9:19 AM

## 2023-03-02 ENCOUNTER — Other Ambulatory Visit: Payer: Self-pay | Admitting: Family Medicine

## 2023-03-02 ENCOUNTER — Other Ambulatory Visit: Payer: Self-pay

## 2023-03-02 MED ORDER — TRULICITY 4.5 MG/0.5ML ~~LOC~~ SOAJ: 4.5000 mg | SUBCUTANEOUS | 1 refills | Status: DC

## 2023-03-02 NOTE — Telephone Encounter (Signed)
TRULICITY PEN INJECTOR 4.5MG /0.5ML (4pk) is on back order.   To prescribe an alternate, please respond with appropriate changes. Please advise

## 2023-03-11 NOTE — Telephone Encounter (Signed)
Caller is Casimiro Needle from [employer].   Caller states: - Got refill request from pt for mounjaro - Needs to know if pt should be on trulicity that is now in stock or have mounjaro filled   Casimiro Needle can be contacted at [phone], option 2-- order number is [id number]

## 2023-03-15 ENCOUNTER — Telehealth: Payer: Self-pay

## 2023-03-15 ENCOUNTER — Other Ambulatory Visit: Payer: Self-pay | Admitting: Family Medicine

## 2023-03-15 NOTE — Telephone Encounter (Signed)
levalbuterol (XOPENEX HFA) 45 MCG/ACT inhaler       Changed from: albuterol (VENTOLIN HFA) 108 (90 Base) MCG/ACT inhaler   Sig: N/A   Disp: Not specified    Refills: 3   Start: 03/15/2023   Class: Normal   Non-formulary   Last ordered: 3 weeks ago (02/21/2023) by Ardith Dark, MD   Rx #: [id number]   Pharmacy comment: Please consider alternative. Please respond with prescription changes or obtain Prior Auth. Call [phone]. Please consider alternative. Please respond with prescription changes or obtain Prior Auth. Call [phone].

## 2023-03-15 NOTE — Telephone Encounter (Signed)
Message sent to PA team

## 2023-03-21 ENCOUNTER — Encounter: Payer: Self-pay | Admitting: Family Medicine

## 2023-03-21 ENCOUNTER — Ambulatory Visit: Payer: 59 | Admitting: Family Medicine

## 2023-03-21 VITALS — BP 111/68 | HR 85 | Temp 97.3°F | Ht 64.0 in | Wt 211.0 lb

## 2023-03-21 DIAGNOSIS — N401 Enlarged prostate with lower urinary tract symptoms: Secondary | ICD-10-CM | POA: Diagnosis not present

## 2023-03-21 DIAGNOSIS — I152 Hypertension secondary to endocrine disorders: Secondary | ICD-10-CM

## 2023-03-21 DIAGNOSIS — E1159 Type 2 diabetes mellitus with other circulatory complications: Secondary | ICD-10-CM | POA: Diagnosis not present

## 2023-03-21 DIAGNOSIS — E119 Type 2 diabetes mellitus without complications: Secondary | ICD-10-CM

## 2023-03-21 DIAGNOSIS — R7989 Other specified abnormal findings of blood chemistry: Secondary | ICD-10-CM | POA: Diagnosis not present

## 2023-03-21 DIAGNOSIS — R338 Other retention of urine: Secondary | ICD-10-CM

## 2023-03-21 LAB — POCT GLYCOSYLATED HEMOGLOBIN (HGB A1C): Hemoglobin A1C: 5.6 % (ref 4.0–5.6)

## 2023-03-21 NOTE — Assessment & Plan Note (Signed)
At goal today without meds. 

## 2023-03-21 NOTE — Assessment & Plan Note (Signed)
Following with urology.  Now intermittently self cathing.  Tolerating well.

## 2023-03-21 NOTE — Patient Instructions (Signed)
It was very nice to see you today!  You numbers look great today!  Keep up the great work.No changes today.   Return in about 6 months (around 09/20/2023) for Annual Physical.   Take care, Dr [name]  PLEASE NOTE:  If you had any lab tests, please let us know if you have not heard back within a few days. You may see your results on mychart before we have a chance to review them but we will give you a call once they are reviewed by [place].   If we ordered any referrals today, please let us know if you have not heard from their office within the next week.   If you had any urgent prescriptions sent in today, please check with the pharmacy within an hour of our visit to make sure the prescription was transmitted appropriately.   Please try these tips to maintain a healthy lifestyle:  Eat at least 3 REAL meals and 1-2 snacks per day.  Aim for no more than 5 hours between eating.  If you eat breakfast, please do so within one hour of getting up.   Each meal should contain half fruits/vegetables, one quarter protein, and one quarter carbs (no bigger than a computer mouse)  Cut down on sweet beverages. This includes juice, soda, and sweet tea.   Drink at least 1 glass of water with each meal and aim for at least 8 glasses per day  Exercise at least 150 minutes every week.

## 2023-03-21 NOTE — Assessment & Plan Note (Signed)
Now following with urology who have him on AndroGel and are monitoring his labs.  He has been tolerating this well.

## 2023-03-21 NOTE — Progress Notes (Signed)
   Aaron Drake is a 67 y.o. male who presents today for an office visit.  Assessment/Plan:  Chronic Problems Addressed Today: Type 2 diabetes mellitus without complication, without long-term current use of insulin (HCC) A1c very well-controlled.  On Trulicity 4.5 mg weekly for the last couple of weeks.  Will continue with this for now until mounjaro becomes more readily available.  We can transition back to Mounjaro 15 mg weekly in a few months.  Recheck A1c in 6 months at [facility].   BPH (benign prostatic hyperplasia) Following with urology.  Now intermittently self cathing.  Tolerating well.  Hypertension associated with diabetes (HCC) At goal today without meds.  Low testosterone Now following with urology who have him on AndroGel and are monitoring his labs.  He has been tolerating this well.     Subjective:  HPI:  See A/P for status of chronic conditions.  Patient is here today for follow-up.  We last saw him 5 months ago.  At our last visit we started him on testosterone replacement with AndroGel.  He had some issues with insurance coverage and ended up seeing urology.  They have him on engine replacement.  He is doing well this.  Is also has some issues with his diabetes meds.  We started him on Trulicity 4.5 mg weekly a few weeks ago due to having insurance issues and not being able to get on the [facility] due to being out of stock.  He has been doing well with this.       Objective:  Physical Exam: BP 111/68   Pulse 85   Temp (!) 97.3 F (36.3 C) (Temporal)   Ht  (1.626 m)   Wt 211 lb (95.7 kg)   SpO2 95%   BMI 36.22 kg/m   Wt Readings from Last 3 Encounters:  03/21/23 211 lb (95.7 kg)  11/09/22 214 lb 3.2 oz (97.2 kg)  10/26/22 216 lb (98 kg)    Gen: No acute distress, resting comfortably CV: Regular rate and rhythm with no murmurs appreciated Pulm: Normal work of breathing, clear to auscultation bilaterally with no crackles, wheezes, or rhonchi Neuro:  Grossly normal, moves all extremities Psych: Normal affect and thought content      Iysis Germain M. [name], MD 03/21/2023 10:40 AM

## 2023-03-21 NOTE — Assessment & Plan Note (Signed)
A1c very well-controlled.  On Trulicity 4.5 mg weekly for the last couple of weeks.  Will continue with this for now until mounjaro becomes more readily available.  We can transition back to Mounjaro 15 mg weekly in a few months.  Recheck A1c in 6 months at [facility].

## 2023-03-23 NOTE — Telephone Encounter (Signed)
Message to sent PA team for update

## 2023-03-23 NOTE — Telephone Encounter (Signed)
Please advise if this has been completed.  Thanks!  

## 2023-03-24 ENCOUNTER — Other Ambulatory Visit (HOSPITAL_COMMUNITY): Payer: Self-pay

## 2023-03-24 NOTE — Telephone Encounter (Signed)
Ran test claim, received paid claim. No PA needed   

## 2023-03-28 NOTE — Telephone Encounter (Signed)
Refill resent to pharmacy since PA is not required

## 2023-03-29 ENCOUNTER — Other Ambulatory Visit: Payer: Self-pay | Admitting: *Deleted

## 2023-03-29 MED ORDER — TEZSPIRE 210 MG/1.91ML ~~LOC~~ SOAJ: 210.0000 mg | SUBCUTANEOUS | 11 refills | Status: DC

## 2023-04-05 ENCOUNTER — Telehealth: Payer: Self-pay | Admitting: Family Medicine

## 2023-04-05 NOTE — Telephone Encounter (Signed)
[facility] called yesterday and today and state the med  levalbuterol [facility] HFA) 45 MCG/ACT inhaler  Has some allergic components and they would like to know if you really want to Rx that med. Please call them back @ [phone] Ref#[id number]

## 2023-04-06 NOTE — Telephone Encounter (Signed)
Lyla Son with [employer] for an update. Hung up while waiting.

## 2023-04-06 NOTE — Telephone Encounter (Signed)
Please advise 

## 2023-04-07 ENCOUNTER — Other Ambulatory Visit: Payer: Self-pay | Admitting: Family Medicine

## 2023-04-07 NOTE — Telephone Encounter (Signed)
Can we please check with patient and pharmacy to see what they recommend for alternatives.

## 2023-04-07 NOTE — Telephone Encounter (Signed)
Im not really sure what "has some allergic components means".   Can we clarify? Looks like his insurance wanted this instead of the albuterol.  Katina Degree. [name], MD 04/07/2023 10:35 AM

## 2023-04-07 NOTE — Telephone Encounter (Signed)
Pharmacy comment: TRULICITY PEN INJECTOR 4.5MG /0.5ML (4pk)   is on back order.To prescribe an alternate please respond with appropriate changes.  (IF applicable, please review your patients formulary for alternate) TRULICITY PEN INJECTOR 4.5MG /0.5ML (4pk)   is on back order.To prescribe an alternate please respond with appropriate changes.  (IF applicable, please review your patients formulary for alternate)

## 2023-04-08 NOTE — Telephone Encounter (Signed)
Left message to return call to our office at their convenience.  

## 2023-04-11 LAB — CBC AND DIFFERENTIAL
HCT: 45 (ref 41–53)
Hemoglobin: 14.8 (ref 13.5–17.5)

## 2023-04-11 LAB — PSA: PSA: 0.21

## 2023-04-11 LAB — TESTOSTERONE: Testosterone: 434.9

## 2023-04-14 ENCOUNTER — Encounter: Payer: Self-pay | Admitting: Family Medicine

## 2023-04-15 NOTE — Telephone Encounter (Signed)
All pharmacies are out of Trulicity and on nationwide shortage, please advise if any alternative you would like to order

## 2023-04-15 NOTE — Telephone Encounter (Signed)
We can try switching him back to Mounjaro 15 mg weekly or Ozempic 2 mg weekly.  Katina Degree. [name], MD 04/15/2023 7:30 AM

## 2023-04-19 NOTE — Telephone Encounter (Signed)
Ok to send in 3 month supply.  Aaron Drake. [name], MD 04/19/2023 9:20 AM

## 2023-04-20 ENCOUNTER — Encounter: Payer: Self-pay | Admitting: Family Medicine

## 2023-04-21 ENCOUNTER — Other Ambulatory Visit: Payer: Self-pay | Admitting: *Deleted

## 2023-04-21 MED ORDER — SEMAGLUTIDE (2 MG/DOSE) 8 MG/3ML ~~LOC~~ SOPN: 2.0000 mg | PEN_INJECTOR | SUBCUTANEOUS | 1 refills | Status: DC

## 2023-04-21 NOTE — Telephone Encounter (Signed)
See note

## 2023-04-21 NOTE — Telephone Encounter (Signed)
Ok to send in Ozempic 2 mg weekly.  Aaron Drake. [name], MD 04/21/2023 3:14 PM

## 2023-04-21 NOTE — Telephone Encounter (Signed)
[facility]  

## 2023-04-26 ENCOUNTER — Other Ambulatory Visit: Payer: Self-pay | Admitting: Allergy and Immunology

## 2023-05-05 ENCOUNTER — Other Ambulatory Visit: Payer: Self-pay | Admitting: Urology

## 2023-05-05 DIAGNOSIS — Z8639 Personal history of other endocrine, nutritional and metabolic disease: Secondary | ICD-10-CM

## 2023-05-16 ENCOUNTER — Other Ambulatory Visit: Payer: Self-pay | Admitting: Family Medicine

## 2023-05-17 ENCOUNTER — Ambulatory Visit: Payer: 59 | Admitting: Allergy and Immunology

## 2023-05-19 ENCOUNTER — Encounter: Payer: Self-pay | Admitting: Family Medicine

## 2023-05-19 ENCOUNTER — Telehealth: Payer: Self-pay

## 2023-05-19 NOTE — Telephone Encounter (Signed)
*  Primary  PA request received for Ozempic (2 MG/DOSE) 8MG /3ML pen-injectors  PA submitted to [employer] via CMM and is pending additional questions/determination  Key: [id number]

## 2023-05-20 NOTE — Telephone Encounter (Signed)
PA has been DENIED:  Your plan only covers this drug when A) your A1C is sent to [place], and your test results are in a certain range (A1C greater than or equal to 6.5 percent), B) your 2-hour plasma glucose (PG) during oral glucose tolerance test (OGTT) is sent to [place], and your results are in a certain range (2-hour PG greater than or equal to 200 mg/dL), C) your random plasma glucose is sent to [place], and your test results are in a certain range (random plasma glucose greater than or equal to 200 mg/dL with symptoms of hyperglycemia (e.g., polyuria, polydipsia, polyphagia) or hyperglycemic crisis), or D) your fasting plasma glucose (FPG) is sent to [place], and your results are in a certain range (FPG greater than or equal to 126 mg/dL). We denied your request because: A) We did not receive your results, or B) Your results were not in the approvable range. We reviewed the information we had. Your request has been denied. Your doctor can send [place] any new or missing information for [place] to review. For this drug, you may have to meet other criteria. You can request the drug policy for more details. You can also request other plan documents for your review.

## 2023-05-23 NOTE — Telephone Encounter (Signed)
He is diabetic. I do not know why this was declined. Do they have any recommended alternatives?  Katina Degree. [name], MD 05/23/2023 9:38 AM

## 2023-05-30 ENCOUNTER — Other Ambulatory Visit (HOSPITAL_COMMUNITY): Payer: Self-pay

## 2023-05-30 NOTE — Telephone Encounter (Signed)
Patient Advocate Encounter  Prior Authorization for Ozempic (2 MG/DOSE) 8MG /3ML pen-injectors has been approved with [employer].    Effective dates: 05/30/23 through 05/29/26  Per [facility] test claim, copay for 28 days supply is $25

## 2023-05-30 NOTE — Telephone Encounter (Signed)
Spoke to pt told him PA for Ozempic 2 mg weekly was approved by insurance, effective 05/30/2023 to 05/29/2026. Asked pt if he needs me to send one month supply to local pharmacy? Pt said no, send to mail order.

## 2023-05-30 NOTE — Telephone Encounter (Signed)
Please redo PA for Ozempic and send over office notes and A1C results the last 3 results. Also pt has tried [place] and [employer]. Trulicity is no longer available.

## 2023-05-30 NOTE — Telephone Encounter (Signed)
Called [facility] and spoke to [place]on, told her PA for Ozempic has been approved please send pt medication. Deja verbalized understanding.

## 2023-05-30 NOTE — Telephone Encounter (Signed)
See other message PA for Ozempic approved.

## 2023-05-30 NOTE — Telephone Encounter (Signed)
PA resubmitted with past lab results. Key: [id number]. Status pending  *Insurance requires a history of an A1C greater than or equal to 6.5 percent.

## 2023-06-16 ENCOUNTER — Other Ambulatory Visit: Payer: 59

## 2023-06-17 ENCOUNTER — Ambulatory Visit
Admission: RE | Admit: 2023-06-17 | Discharge: 2023-06-17 | Disposition: A | Discharge status: Home / Self Care | Payer: 59 | Source: Ambulatory Visit | Attending: Urology | Admitting: Urology

## 2023-06-17 DIAGNOSIS — Z8639 Personal history of other endocrine, nutritional and metabolic disease: Secondary | ICD-10-CM

## 2023-06-17 MED ORDER — GADOPICLENOL 0.5 MMOL/ML IV SOLN
10.0000 mL | Freq: Once | INTRAVENOUS | Status: AC | PRN
  Administered 2023-06-17: 10 mL via INTRAVENOUS

## 2023-07-05 ENCOUNTER — Encounter: Payer: Self-pay | Admitting: Allergy and Immunology

## 2023-07-05 ENCOUNTER — Ambulatory Visit: Payer: 59 | Admitting: Allergy and Immunology

## 2023-07-05 ENCOUNTER — Other Ambulatory Visit: Payer: Self-pay

## 2023-07-05 VITALS — BP 130/78 | HR 88 | Temp 98.3°F | Resp 16 | Ht 64.0 in | Wt 206.8 lb

## 2023-07-05 DIAGNOSIS — K219 Gastro-esophageal reflux disease without esophagitis: Secondary | ICD-10-CM | POA: Diagnosis not present

## 2023-07-05 DIAGNOSIS — J4550 Severe persistent asthma, uncomplicated: Secondary | ICD-10-CM

## 2023-07-05 DIAGNOSIS — J3089 Other allergic rhinitis: Secondary | ICD-10-CM

## 2023-07-05 DIAGNOSIS — L2089 Other atopic dermatitis: Secondary | ICD-10-CM

## 2023-07-05 NOTE — Progress Notes (Unsigned)
[place] - [place] - [place] - [place] - [place]   Follow-up Note  Referring Provider: Ardith Dark, MD Primary Provider: Ardith Dark, MD Date of Office Visit: 07/05/2023  Subjective:   Aaron Drake (DOB: 05-10-1956) is a 67 y.o. male who [facility] in re-evaluation of the following:  HPI: Aaron Drake returns to this clinic in reevaluation of asthma, atopic dermatitis, allergic rhinitis, reflux.  I last saw her in this clinic 09 November 2022.  He believes that he is doing very well with his airway.  Doing very well with his airway means that Aaron Drake uses a short acting bronchodilator 4 times per day and does not use any controller agents other than tezepelumab injections.  He has had very bad reactions using controller agents in the past but he believes that tezepelumab has really resulted in very good control of his respiratory tract issues and overall he is very satisfied with the response that he has received with this treatment.  He has not required a systemic steroid or an antibiotic for any type of airway issue.  He believes that his reflux is under very good control while using omeprazole.  He discontinued his famotidine and indeed that was the agent was responsible for his constipation.  He uses some topical hydrocortisone about once or twice a week to certain areas of his body usually his anterior chest but overall he feels as though his eczema is doing quite well.    Allergies as of 07/05/2023       Reactions   Airduo Digihaler [fluticasone-salmeterol] Other (See Comments)   Redness -Head to toe; skin Peeling - Head to to   Lipitor [atorvastatin] Other (See Comments)   Muscle cramps and joint ache        Medication List    albuterol 108 (90 Base) MCG/ACT inhaler Commonly known as: VENTOLIN HFA USE 2 INHALATIONS ORALLY   EVERY 4 HOURS AS NEEDED FORWHEEZING OR SHORTNESS OF   BREATH   CALCIUM + D3  PO Take 1 tablet by mouth daily.   EPINEPHrine 0.3 mg/0.3 mL Soaj injection Commonly known as: EpiPen 2-Pak Inject 0.3 mg into the muscle as needed for anaphylaxis.   finasteride 5 MG tablet Commonly known as: PROSCAR Take 5 mg by mouth daily.   levalbuterol 45 MCG/ACT inhaler Commonly known as: XOPENEX HFA Inhale 1 puff into the lungs every 6 (six) hours as needed for wheezing.   loratadine 10 MG tablet Commonly known as: CLARITIN Take 1 tablet (10 mg total) by mouth daily.   meclizine 12.5 MG tablet Commonly known as: ANTIVERT Take 1 tablet by mouth every 6 hours if needed for vertigo   montelukast 10 MG tablet Commonly known as: SINGULAIR Take 1 tablet (10 mg total) by mouth at bedtime.   multivitamin with minerals Tabs tablet Take 1 tablet by mouth daily.   omeprazole 20 MG capsule Commonly known as: PRILOSEC Take 1 capsule (20 mg total) by mouth in the morning.   Ozempic (2 MG/DOSE) 8 MG/3ML Sopn Generic drug: Semaglutide (2 MG/DOSE) INJECT 2MG  SUBCUTANEOUSLY  WEEKLY (EVERY 7 DAYS) AS   DIRECTED   phenazopyridine 200 MG tablet Commonly known as: Pyridium Take 1 tablet (200 mg total) by mouth 3 (three) times daily as needed for pain.   Testosterone 12.5 MG/ACT (1%) Gel Place 1 Pump onto the skin daily.   Tezspire 210 MG/1. Soaj Generic drug: Tezepelumab-ekko Inject 210 mg into the  skin every 28 (twenty-eight) days.    Past Medical History:  Diagnosis Date   Allergy    Arthritis    Asthma 05/12/2012   BPH (benign prostatic hyperplasia) 05/19/2012   Diabetes mellitus without complication (HCC)    Dyspnea    physical stress; exercise    History of kidney stones    Hyperlipidemia    Impaired glucose tolerance 05/19/2012   Tear of meniscus of right knee 2018   partial tear    Past Surgical History:  Procedure Laterality Date   BALLOON DILATION N/A 06/10/2022   Procedure: URETHRAL BALLOON DILATION-OPTILUME;  Surgeon: Crist Fat, MD;   [facility];  Service: Urology;  Laterality: N/A;   CATARACT EXTRACTION Right    CYSTOSCOPY WITH RETROGRADE URETHROGRAM N/A 06/10/2022   Procedure: CYSTOSCOPY WITH RETROGRAM URETHROGRAM;  Surgeon: Crist Fat, MD;  Location: [facility];  Service: Urology;  Laterality: N/A;   EXTRACORPOREAL SHOCK WAVE LITHOTRIPSY Left 05/08/2018   Procedure: LEFT EXTRACORPOREAL SHOCK WAVE LITHOTRIPSY (ESWL);  Surgeon: Jerilee Field, MD;  Location: [name];  Service: Urology;  Laterality: Left;   INSERTION OF MESH N/A 05/17/2016   Procedure: INSERTION OF MESH;  Surgeon: Emelia Loron, MD;  Location: [facility];  Service: General;  Laterality: N/A;   UMBILICAL HERNIA REPAIR N/A 05/17/2016   Procedure: UMBILICAL HERNIA REPAIR;  Surgeon: Emelia Loron, MD;  Location: [facility];  Service: General;  Laterality: N/A;   WISDOM TOOTH EXTRACTION      Review of systems negative except as noted in HPI / PMHx or noted below:  Review of Systems  Constitutional: Negative.   HENT: Negative.    Eyes: Negative.   Respiratory: Negative.    Cardiovascular: Negative.   Gastrointestinal: Negative.   Genitourinary: Negative.   Musculoskeletal: Negative.   Skin: Negative.   Neurological: Negative.   Endo/Heme/Allergies: Negative.   Psychiatric/Behavioral: Negative.       Objective:   Vitals:   07/05/23 1509  BP: 130/78  Pulse: 88  Resp: 16  Temp: 98.3 F (36.8 C)  SpO2: 95%   Height: 5\' 4"  (162.6 cm)  Weight: 206 lb 12.8 oz (93.8 kg)   Physical Exam Constitutional:      Appearance: He is not diaphoretic.  HENT:     Head: Normocephalic.     Right Ear: Tympanic membrane, ear canal and external ear normal.     Left Ear: Tympanic membrane, ear canal and external ear normal.     Nose: Nose normal. No mucosal edema or rhinorrhea.     Mouth/Throat:     Pharynx: Uvula midline. No oropharyngeal exudate.  Eyes:     Conjunctiva/sclera: Conjunctivae normal.  Neck:      Thyroid: No thyromegaly.     Trachea: Trachea normal. No tracheal tenderness or tracheal deviation.  Cardiovascular:     Rate and Rhythm: Normal rate and regular rhythm.     Heart sounds: Normal heart sounds, S1 normal and S2 normal. No murmur heard. Pulmonary:     Effort: No respiratory distress.     Breath sounds: Normal breath sounds. No stridor. No wheezing or rales.  Lymphadenopathy:     Head:     Right side of head: No tonsillar adenopathy.     Left side of head: No tonsillar adenopathy.     Cervical: No cervical adenopathy.  Skin:    Findings: No erythema or rash.     Nails: There is no clubbing.  Neurological:  Mental Status: He is alert.     Diagnostics:    Spirometry was performed and demonstrated an FEV1 of 1.70 at 64 % of predicted.  Assessment and Plan:   No diagnosis found.  Patient Instructions   1. Continue tezepelumab injections  2. Continue montelukast 10 mg -1 tablet 1 time per day  4. Continue Omeprazole 20 mg in AM   5. Continue Claritin and Albuterol HFA and topical treatment if needed.  6. Return to clinic in 6 months or earlier if problem  7. Plan for fall flu vaccine         Laurette Schimke, MD Allergy / Immunology [facility]

## 2023-07-05 NOTE — Patient Instructions (Addendum)
  1. Continue tezepelumab injections  2. Continue montelukast 10 mg -1 tablet 1 time per day  4. Continue Omeprazole 20 mg in AM   5. Continue Claritin and Albuterol HFA and topical treatment if needed.  6. Return to clinic in 6 months or earlier if problem  7. Plan for fall flu vaccine

## 2023-07-06 ENCOUNTER — Encounter: Payer: Self-pay | Admitting: Allergy and Immunology

## 2023-07-09 ENCOUNTER — Other Ambulatory Visit: Payer: Self-pay | Admitting: Allergy and Immunology

## 2023-07-18 ENCOUNTER — Other Ambulatory Visit: Payer: Self-pay | Admitting: Allergy and Immunology

## 2023-07-19 LAB — HM DIABETES EYE EXAM

## 2023-08-12 ENCOUNTER — Other Ambulatory Visit: Payer: Self-pay | Admitting: Allergy and Immunology

## 2023-09-10 ENCOUNTER — Other Ambulatory Visit: Payer: Self-pay | Admitting: Allergy and Immunology

## 2023-09-16 ENCOUNTER — Other Ambulatory Visit: Payer: Self-pay | Admitting: Allergy and Immunology

## 2023-10-06 ENCOUNTER — Encounter: Payer: Self-pay | Admitting: Family Medicine

## 2023-10-06 ENCOUNTER — Ambulatory Visit: Payer: 59 | Admitting: Family Medicine

## 2023-10-06 ENCOUNTER — Telehealth: Payer: Self-pay | Admitting: Family Medicine

## 2023-10-06 VITALS — BP 124/73 | HR 80 | Temp 97.3°F | Ht 64.0 in | Wt 193.0 lb

## 2023-10-06 DIAGNOSIS — R7989 Other specified abnormal findings of blood chemistry: Secondary | ICD-10-CM

## 2023-10-06 DIAGNOSIS — J4550 Severe persistent asthma, uncomplicated: Secondary | ICD-10-CM | POA: Diagnosis not present

## 2023-10-06 DIAGNOSIS — Z23 Encounter for immunization: Secondary | ICD-10-CM | POA: Diagnosis not present

## 2023-10-06 DIAGNOSIS — E785 Hyperlipidemia, unspecified: Secondary | ICD-10-CM

## 2023-10-06 DIAGNOSIS — Z0001 Encounter for general adult medical examination with abnormal findings: Secondary | ICD-10-CM | POA: Diagnosis not present

## 2023-10-06 DIAGNOSIS — E1169 Type 2 diabetes mellitus with other specified complication: Secondary | ICD-10-CM | POA: Diagnosis not present

## 2023-10-06 DIAGNOSIS — E119 Type 2 diabetes mellitus without complications: Secondary | ICD-10-CM

## 2023-10-06 DIAGNOSIS — M5432 Sciatica, left side: Secondary | ICD-10-CM

## 2023-10-06 DIAGNOSIS — I152 Hypertension secondary to endocrine disorders: Secondary | ICD-10-CM

## 2023-10-06 DIAGNOSIS — Z0000 Encounter for general adult medical examination without abnormal findings: Secondary | ICD-10-CM

## 2023-10-06 DIAGNOSIS — E1159 Type 2 diabetes mellitus with other circulatory complications: Secondary | ICD-10-CM

## 2023-10-06 DIAGNOSIS — Z7985 Long-term (current) use of injectable non-insulin antidiabetic drugs: Secondary | ICD-10-CM

## 2023-10-06 DIAGNOSIS — L2089 Other atopic dermatitis: Secondary | ICD-10-CM

## 2023-10-06 DIAGNOSIS — N401 Enlarged prostate with lower urinary tract symptoms: Secondary | ICD-10-CM

## 2023-10-06 LAB — COMPREHENSIVE METABOLIC PANEL
ALT: 18 U/L (ref 0–53)
AST: 16 U/L (ref 0–37)
Albumin: 4.6 g/dL (ref 3.5–5.2)
Alkaline Phosphatase: 63 U/L (ref 39–117)
BUN: 24 mg/dL — ABNORMAL HIGH (ref 6–23)
CO2: 24 meq/L (ref 19–32)
Calcium: 9.7 mg/dL (ref 8.4–10.5)
Chloride: 103 meq/L (ref 96–112)
Creatinine, Ser: 0.88 mg/dL (ref 0.40–1.50)
GFR: 88.04 mL/min (ref >60.00)
Glucose, Bld: 79 mg/dL (ref 70–99)
Potassium: 4.1 meq/L (ref 3.5–5.1)
Sodium: 137 meq/L (ref 135–145)
Total Bilirubin: 0.5 mg/dL (ref 0.2–1.2)
Total Protein: 7 g/dL (ref 6.0–8.3)

## 2023-10-06 LAB — LIPID PANEL
Cholesterol: 210 mg/dL — ABNORMAL HIGH (ref 0–200)
HDL: 44.3 mg/dL (ref >39.00)
LDL Cholesterol: 133 mg/dL — ABNORMAL HIGH (ref 0–99)
NonHDL: 165.63
Total CHOL/HDL Ratio: 5
Triglycerides: 163 mg/dL — ABNORMAL HIGH (ref 0.0–149.0)
VLDL: 32.6 mg/dL (ref 0.0–40.0)

## 2023-10-06 LAB — PSA: PSA: 1.17 ng/mL (ref 0.10–4.00)

## 2023-10-06 LAB — TSH: TSH: 0.79 u[IU]/mL (ref 0.35–5.50)

## 2023-10-06 LAB — TESTOSTERONE: Testosterone: 359.28 ng/dL (ref 300.00–890.00)

## 2023-10-06 NOTE — Assessment & Plan Note (Signed)
Sees chiropractor every few weeks.  Symptoms are stable.

## 2023-10-06 NOTE — Assessment & Plan Note (Signed)
He is down about 25 pounds over the last 6 months.  Working on diet and exercise.  Congratulated patient.  He will continue to work on lifestyle interventions.

## 2023-10-06 NOTE — Assessment & Plan Note (Signed)
Doing well on Ozempic 2 mg weekly.  He is doing a great job with diet and exercise and is down about 25 pounds since last time.  Will check A1c today.

## 2023-10-06 NOTE — Assessment & Plan Note (Signed)
Continue management per allergist.  

## 2023-10-06 NOTE — Addendum Note (Signed)
Addended by: Lorn Junes on: 10/06/2023 03:05 PM   Modules accepted: Orders

## 2023-10-06 NOTE — Assessment & Plan Note (Signed)
Following with urology.  Symptoms are stable.

## 2023-10-06 NOTE — Assessment & Plan Note (Addendum)
Following with urology for this.  He is currently on testosterone replacement.  Was recently found to have elevated prolactin however MRI was negative.  Will recheck prolactin and testosterone today per patient request.

## 2023-10-06 NOTE — Assessment & Plan Note (Signed)
Check lipids.  Has not been able to tolerate statins in the past due to myalgias.

## 2023-10-06 NOTE — Progress Notes (Signed)
Chief Complaint:  Aaron Drake is a 67 y.o. male who presents today for his annual comprehensive physical exam.    Assessment/Plan:  Chronic Problems Addressed Today: Type 2 diabetes mellitus without complication, without long-term current use of insulin (HCC) Doing well on Ozempic 2 mg weekly.  He is doing a great job with diet and exercise and is down about 25 pounds since last time.  Will check A1c today.  Hyperlipidemia associated with type 2 diabetes mellitus (HCC) Check lipids.  Has not been able to tolerate statins in the past due to myalgias.  Low testosterone Following with urology for this.  He is currently on testosterone replacement.  Was recently found to have elevated prolactin however MRI was negative.  Will recheck prolactin and testosterone today per patient request.  BPH (benign prostatic hyperplasia) Following with urology.  Symptoms are stable.  Morbid obesity (HCC) He is down about 25 pounds over the last 6 months.  Working on diet and exercise.  Congratulated patient.  He will continue to work on lifestyle interventions.  Hypertension associated with diabetes (HCC) Blood pressure at goal today without meds.  Asthma, severe persistent, well-controlled Continue management per allergist.  Left sided sciatica Sees chiropractor every few weeks.  Symptoms are stable.  Other atopic dermatitis Sees allergist for this.  He does have a flareup on his back.  Recommended over-the-counter cortisone cream if needed.  He will let us know if not improving.  Preventative Healthcare: Flu vaccine given today. Check labs.   Patient Counseling(The following topics were reviewed and/or handout was given):  -Nutrition: Stressed importance of moderation in sodium/caffeine intake, saturated fat and cholesterol, caloric balance, sufficient intake of fresh fruits, vegetables, and fiber.  -Stressed the importance of regular exercise.   -Substance Abuse: Discussed  cessation/primary prevention of tobacco, alcohol, or other drug use; driving or other dangerous activities under the influence; availability of treatment for abuse.   -Injury prevention: Discussed safety belts, safety helmets, smoke detector, smoking near bedding or upholstery.   -Sexuality: Discussed sexually transmitted diseases, partner selection, use of condoms, avoidance of unintended pregnancy and contraceptive alternatives.   -Dental health: Discussed importance of regular tooth brushing, flossing, and dental visits.  -Health maintenance and immunizations reviewed. Please refer to Health maintenance section.  Return to care in 1 year for next preventative visit.     Subjective:  HPI:  He has no acute complaints today.  Patient was last seen here 6 months ago.  Since our last visit he has been visiting with urology for testosterone management.  They checked labs and found elevated prolactin.  He underwent brain MRI which did not show any adenomas.  He is currently on testosterone replacement and tolerating well.  He does also have a spot of eczema on his right lower back.  This has been pruritic.  No specific treatments tried.  Lifestyle Diet: Low carb.  Exercise: weight lifting multiple times per week.      10/06/2023   10:45 AM  Depression screen PHQ 2/9  Decreased Interest 0  Down, Depressed, Hopeless 0  PHQ - 2 Score 0    Health Maintenance Due  Topic Date Due   FOOT EXAM  10/31/2019   DTaP/Tdap/Td (3 - Td or Tdap) 05/19/2022   OPHTHALMOLOGY EXAM  07/08/2023   Diabetic kidney evaluation - eGFR measurement  09/25/2023   Diabetic kidney evaluation - Urine ACR  09/25/2023   HEMOGLOBIN A1C  09/20/2023     ROS: Per HPI, otherwise a  complete review of systems was negative.   PMH:  The following were reviewed and entered/updated in epic: Past Medical History:  Diagnosis Date   Allergy    Arthritis    Asthma 05/12/2012   BPH (benign prostatic hyperplasia) 05/19/2012    Diabetes mellitus without complication (HCC)    Dyspnea    physical stress; exercise    History of kidney stones    Hyperlipidemia    Impaired glucose tolerance 05/19/2012   Tear of meniscus of right knee 2018   partial tear   Patient Active Problem List   Diagnosis Date Noted   Low testosterone 10/26/2022   Urethral stricture 09/24/2022   Left sided sciatica 09/21/2021   Asthma, severe persistent, well-controlled 10/30/2019   Other allergic rhinitis 10/30/2019   Other atopic dermatitis 10/30/2019   Gastroesophageal reflux disease 10/30/2019   Hypertension associated with diabetes (HCC) 04/30/2019   Erosive oral lichen planus, Dx via Bx, Tx with Dexamethasone rinse, followed by Periodontist 01/28/2019   Primary osteoarthritis of right knee 08/15/2018   Renal calculi 06/21/2018   Morbid obesity (HCC) 03/26/2017   Type 2 diabetes mellitus without complication, without long-term current use of insulin (HCC) 03/26/2017   BPH (benign prostatic hyperplasia) 05/19/2012   Hyperlipidemia associated with type 2 diabetes mellitus (HCC)    Past Surgical History:  Procedure Laterality Date   BALLOON DILATION N/A 06/10/2022   Procedure: URETHRAL BALLOON DILATION-OPTILUME;  Surgeon: Crist Fat, MD;  Location: [facility];  Service: Urology;  Laterality: N/A;   CATARACT EXTRACTION Right    CYSTOSCOPY WITH RETROGRADE URETHROGRAM N/A 06/10/2022   Procedure: CYSTOSCOPY WITH RETROGRAM URETHROGRAM;  Surgeon: Crist Fat, MD;  Location: [facility];  Service: Urology;  Laterality: N/A;   EXTRACORPOREAL SHOCK WAVE LITHOTRIPSY Left 05/08/2018   Procedure: LEFT EXTRACORPOREAL SHOCK WAVE LITHOTRIPSY (ESWL);  Surgeon: Jerilee Field, MD;  Location: [name];  Service: Urology;  Laterality: Left;   INSERTION OF MESH N/A 05/17/2016   Procedure: INSERTION OF MESH;  Surgeon: Emelia Loron, MD;  Location: [facility];  Service: General;  Laterality: N/A;   UMBILICAL  HERNIA REPAIR N/A 05/17/2016   Procedure: UMBILICAL HERNIA REPAIR;  Surgeon: Emelia Loron, MD;  Location: [facility];  Service: General;  Laterality: N/A;   WISDOM TOOTH EXTRACTION      Family History  Problem Relation Age of Onset   Heart disease Mother    Lung cancer Mother    Stroke Father    COPD Father        Smoker   Aortic aneurysm Father    Colon cancer Maternal Uncle     Medications- reviewed and updated Current Outpatient Medications  Medication Sig Dispense Refill   albuterol (VENTOLIN HFA) 108 (90 Base) MCG/ACT inhaler USE 2 INHALATIONS ORALLY   EVERY 4 HOURS AS NEEDED FORWHEEZING OR SHORTNESS OF   BREATH. 24 g 1   Calcium Carb-Cholecalciferol (CALCIUM + D3 PO) Take 1 tablet by mouth daily.     EPINEPHrine (EPIPEN 2-PAK) 0.3 mg/0.3 mL IJ SOAJ injection Inject 0.3 mg into the muscle as needed for anaphylaxis. 1 each 1   finasteride (PROSCAR) 5 MG tablet Take 5 mg by mouth daily.     levalbuterol (XOPENEX HFA) 45 MCG/ACT inhaler Inhale 1 puff into the lungs every 6 (six) hours as needed for wheezing. 15 g 3   loratadine (CLARITIN) 10 MG tablet Take 1 tablet (10 mg total) by mouth daily. 90 tablet 1   meclizine (ANTIVERT) 12.5 MG  tablet Take 1 tablet by mouth every 6 hours if needed for vertigo 180 tablet 0   montelukast (SINGULAIR) 10 MG tablet TAKE 1 TABLET AT BEDTIME 90 tablet 1   Multiple Vitamin (MULTIVITAMIN WITH MINERALS) TABS tablet Take 1 tablet by mouth daily.     omeprazole (PRILOSEC) 20 MG capsule TAKE 1 CAPSULE EVERY       MORNING 90 capsule 1   OZEMPIC, 2 MG/DOSE, 8 MG/3ML SOPN INJECT 2MG  SUBCUTANEOUSLY  WEEKLY (EVERY 7 DAYS) AS   DIRECTED 9 mL 3   phenazopyridine (PYRIDIUM) 200 MG tablet Take 1 tablet (200 mg total) by mouth 3 (three) times daily as needed for pain. 10 tablet 0   Testosterone 12.5 MG/ACT (1%) GEL Place 1 Pump onto the skin daily. 75 g 5   Tezepelumab-ekko (TEZSPIRE) 210 MG/1. SOAJ Inject 210 mg into the skin every 28 (twenty-eight) days.  1.91 mL 11   Current Facility-Administered Medications  Medication Dose Route Frequency Provider Last Rate Last Admin   tezepelumab-ekko (TEZSPIRE) 210 MG/1. syringe 210 mg  210 mg Subcutaneous Q28 days Jessica Priest, MD   210 mg at 05/20/22 1129    Allergies-reviewed and updated Allergies  Allergen Reactions   Airduo Digihaler [Fluticasone-Salmeterol] Other (See Comments)    Redness -Head to toe; skin Peeling - Head to to   Lipitor [Atorvastatin] Other (See Comments)    Muscle cramps and joint ache    Social History   Socioeconomic History   Marital status: Single    Spouse name: Not on file   Number of children: Not on file   Years of education: Not on file   Highest education level: Master's degree (e.g., MA, MS, MEng, MEd, MSW, MBA)  Occupational History   Occupation: catholic priest  Tobacco Use   Smoking status: Former    Current packs/day: 0.00    Types: Cigarettes    Quit date: 1998    Years since quitting: 26.8   Smokeless tobacco: Never  Vaping Use   Vaping status: Never Used  Substance and Sexual Activity   Alcohol use: Yes    Alcohol/week: 1.0 standard drink of alcohol    Types: 1 Shots of liquor per week    Comment: rare   Drug use: No   Sexual activity: Not Currently  Other Topics Concern   Not on file  Social History Narrative   Not on file   Social Determinants of Health   Financial Resource Strain: Low Risk  (03/17/2023)   Overall Financial Resource Strain (CARDIA)    Difficulty of Paying Living Expenses: Not hard at all  Food Insecurity: No Food Insecurity (03/17/2023)   Hunger Vital Sign    Worried About Running Out of Food in the Last Year: Never true    Ran Out of Food in the Last Year: Never true  Transportation Needs: No Transportation Needs (03/17/2023)   PRAPARE - Administrator, Civil Service (Medical): No    Lack of Transportation (Non-Medical): No  Physical Activity: Insufficiently Active (03/17/2023)   Exercise  Vital Sign    Days of Exercise per Week: 5 days    Minutes of Exercise per Session: 20 min  Stress: No Stress Concern Present (03/17/2023)   Aaron Drake [employer] Occupational [employer] - Occupational Stress Questionnaire    Feeling of Stress : Not at all  Social Connections: Socially Isolated (03/17/2023)   Social Connection and Isolation Panel [NHANES]    Frequency of Communication with Friends and Family: Never  Frequency of Social Gatherings with Friends and Family: Never    Attends Religious Services: More than 4 times per year    Active Member of Clubs or Organizations: No    Attends Engineer, structural: Not on file    Marital Status: Never married        Objective:  Physical Exam: BP 124/73   Pulse 80   Temp (!) 97.3 F (36.3 C)   Ht 5\' 4"  (1.626 m)   Wt 193 lb (87.5 kg)   SpO2 96%   BMI 33.13 kg/m   Body mass index is 33.13 kg/m. Wt Readings from Last 3 Encounters:  10/06/23 193 lb (87.5 kg)  07/05/23 206 lb 12.8 oz (93.8 kg)  03/21/23 211 lb (95.7 kg)   Gen: NAD, resting comfortably HEENT: TMs normal bilaterally. OP clear. No thyromegaly noted.  CV: RRR with no murmurs appreciated Pulm: NWOB, CTAB with no crackles, wheezes, or rhonchi GI: Normal bowel sounds present. Soft, Nontender, Nondistended. MSK: no edema, cyanosis, or clubbing noted Skin: warm, dry.  Eczematous patch approximately 5 cm in diameter on right lower back with several scattered excoriations. Neuro: CN2-12 grossly intact. Strength 5/5 in upper and lower extremities. Reflexes symmetric and intact bilaterally.  Psych: Normal affect and thought content     Rahshawn Remo M. [name], MD 10/06/2023 11:32 AM

## 2023-10-06 NOTE — Patient Instructions (Signed)
It was very nice to see you today!  We will check blood work today.  Keep up the great work with diet and exercise.  We will see you back in 6 months to recheck your A1c.  Please come back sooner if needed.  Return in about 6 months (around 04/04/2024) for Follow Up.   Take care, Dr [name]  PLEASE NOTE:  If you had any lab tests, please let us know if you have not heard back within a few days. You may see your results on mychart before we have a chance to review them but we will give you a call once they are reviewed by [place].   If we ordered any referrals today, please let us know if you have not heard from their office within the next week.   If you had any urgent prescriptions sent in today, please check with the pharmacy within an hour of our visit to make sure the prescription was transmitted appropriately.   Please try these tips to maintain a healthy lifestyle:  Eat at least 3 REAL meals and 1-2 snacks per day.  Aim for no more than 5 hours between eating.  If you eat breakfast, please do so within one hour of getting up.   Each meal should contain half fruits/vegetables, one quarter protein, and one quarter carbs (no bigger than a computer mouse)  Cut down on sweet beverages. This includes juice, soda, and sweet tea.   Drink at least 1 glass of water with each meal and aim for at least 8 glasses per day  Exercise at least 150 minutes every week.    Preventive Care 55 Years and Older, Male Preventive care refers to lifestyle choices and visits with your health care provider that can promote health and wellness. Preventive care visits are also called wellness exams. What can I expect for my preventive care visit? Counseling During your preventive care visit, your health care provider may ask about your: Medical history, including: Past medical problems. Family medical history. History of falls. Current health, including: Emotional well-being. Home life and relationship  well-being. Sexual activity. Memory and ability to understand (cognition). Lifestyle, including: Alcohol, nicotine or tobacco, and drug use. Access to firearms. Diet, exercise, and sleep habits. Work and work Astronomer. Sunscreen use. Safety issues such as seatbelt and bike helmet use. Physical exam Your health care provider will check your: Height and weight. These may be used to calculate your BMI (body mass index). BMI is a measurement that tells if you are at a healthy weight. Waist circumference. This measures the distance around your waistline. This measurement also tells if you are at a healthy weight and may help predict your risk of certain diseases, such as type 2 diabetes and high blood pressure. Heart rate and blood pressure. Body temperature. Skin for abnormal spots. What immunizations do I need?  Vaccines are usually given at various ages, according to a schedule. Your health care provider will recommend vaccines for you based on your age, medical history, and lifestyle or other factors, such as travel or where you work. What tests do I need? Screening Your health care provider may recommend screening tests for certain conditions. This may include: Lipid and cholesterol levels. Diabetes screening. This is done by checking your blood sugar (glucose) after you have not eaten for a while (fasting). Hepatitis C test. Hepatitis B test. HIV (human immunodeficiency virus) test. STI (sexually transmitted infection) testing, if you are at risk. Lung cancer screening. Colorectal cancer  screening. Prostate cancer screening. Abdominal aortic aneurysm (AAA) screening. You may need this if you are a current or former smoker. Talk with your health care provider about your test results, treatment options, and if necessary, the need for more tests. Follow these instructions at home: Eating and drinking  Eat a diet that includes fresh fruits and vegetables, whole grains, lean  protein, and low-fat dairy products. Limit your intake of foods with high amounts of sugar, saturated fats, and salt. Take vitamin and mineral supplements as recommended by your health care provider. Do not drink alcohol if your health care provider tells you not to drink. If you drink alcohol: Limit how much you have to 0-2 drinks a day. Know how much alcohol is in your drink. In the [place] one drink equals one 12 oz bottle of beer (355 mL), one 5 oz glass of wine (148 mL), or one 1 oz glass of hard liquor (44 mL). Lifestyle Brush your teeth every morning and night with fluoride toothpaste. Floss one time each day. Exercise for at least 30 minutes 5 or more days each week. Do not use any products that contain nicotine or tobacco. These products include cigarettes, chewing tobacco, and vaping devices, such as e-cigarettes. If you need help quitting, ask your health care provider. Do not use drugs. If you are sexually active, practice safe sex. Use a condom or other form of protection to prevent STIs. Take aspirin only as told by your health care provider. Make sure that you understand how much to take and what form to take. Work with your health care provider to find out whether it is safe and beneficial for you to take aspirin daily. Ask your health care provider if you need to take a cholesterol-lowering medicine (statin). Find healthy ways to manage stress, such as: Meditation, yoga, or listening to music. Journaling. Talking to a trusted person. Spending time with friends and family. Safety Always wear your seat belt while driving or riding in a vehicle. Do not drive: If you have been drinking alcohol. Do not ride with someone who has been drinking. When you are tired or distracted. While texting. If you have been using any mind-altering substances or drugs. Wear a helmet and other protective equipment during sports activities. If you have firearms in your house, make sure you follow  all gun safety procedures. Minimize exposure to UV radiation to reduce your risk of skin cancer. What's next? Visit your health care provider once a year for an annual wellness visit. Ask your health care provider how often you should have your eyes and teeth checked. Stay up to date on all vaccines. This information is not intended to replace advice given to you by your health care provider. Make sure you discuss any questions you have with your health care provider. Document Revised: 05/13/2021 Document Reviewed: 05/13/2021 Elsevier Patient Education  2024 [employer].

## 2023-10-06 NOTE — Assessment & Plan Note (Signed)
Sees allergist for this.  He does have a flareup on his back.  Recommended over-the-counter cortisone cream if needed.  He will let us know if not improving.

## 2023-10-06 NOTE — Assessment & Plan Note (Signed)
Blood pressure at goal today without meds. 

## 2023-10-06 NOTE — Addendum Note (Signed)
Addended by: Lorn Junes on: 10/06/2023 12:06 PM   Modules accepted: Orders

## 2023-10-06 NOTE — Telephone Encounter (Signed)
Called pt and informed him that CBC and A1C need to be re-collected since the blood collected today had clotted. I offered pt to either go to our [place] location or to come back to PCP office for re-draw. Pt stated he would have to come in sometimes next week since he is busy the rest of the week. I informed pt to call us when available so proper arrangements can be made. Pt verbalized understanding.

## 2023-10-07 ENCOUNTER — Encounter: Payer: Self-pay | Admitting: Family Medicine

## 2023-10-07 LAB — PROLACTIN: Prolactin: 36 ng/mL — ABNORMAL HIGH (ref 2.0–18.0)

## 2023-10-07 NOTE — Telephone Encounter (Signed)
See result note.  Aaron Drake. [name], MD 10/07/2023 12:55 PM

## 2023-10-07 NOTE — Progress Notes (Signed)
Prolactin still elevated.  Recommend referral to endocrinology.  Cholesterol is also up a little bit since last time we checked.  I know he has not tolerated statins in the past however there is a nonstatin cholesterol medication we can try called Zetia.  Please send in Zetia 10 mg daily if he is agreeable to start.  Regardless he should continue to work on diet and exercise and we can recheck this in 6 to 12 months.  The rest of his labs are all at goal.  We are still waiting on results of his CBC and A1c-can we check with lab about getting these?

## 2023-10-07 NOTE — Telephone Encounter (Signed)
Please advise 

## 2023-10-10 ENCOUNTER — Other Ambulatory Visit (INDEPENDENT_AMBULATORY_CARE_PROVIDER_SITE_OTHER): Payer: 59

## 2023-10-10 ENCOUNTER — Encounter: Payer: 59 | Admitting: Family Medicine

## 2023-10-10 DIAGNOSIS — Z0000 Encounter for general adult medical examination without abnormal findings: Secondary | ICD-10-CM

## 2023-10-10 DIAGNOSIS — E119 Type 2 diabetes mellitus without complications: Secondary | ICD-10-CM | POA: Diagnosis not present

## 2023-10-10 LAB — HEMOGLOBIN A1C: Hgb A1c MFr Bld: 5.8 % (ref 4.6–6.5)

## 2023-10-10 LAB — CBC
HCT: 45.2 % (ref 39.0–52.0)
Hemoglobin: 15 g/dL (ref 13.0–17.0)
MCHC: 33.1 g/dL (ref 30.0–36.0)
MCV: 89.6 fL (ref 78.0–100.0)
Platelets: 258 10*3/uL (ref 150.0–400.0)
RBC: 5.05 Mil/uL (ref 4.22–5.81)
RDW: 14.6 % (ref 11.5–15.5)
WBC: 8.5 10*3/uL (ref 4.0–10.5)

## 2023-10-11 NOTE — Progress Notes (Signed)
His blood counts are all normal.  A1c is very well-controlled and about the same as last time that we checked.  Do not need to make any changes to his treatment plan.  He should continue to work on diet and exercise and we will see him back in 6 months to recheck.

## 2023-10-12 NOTE — Telephone Encounter (Signed)
See result note - recommend referral to endocrinology.  Katina Degree. [name], MD 10/12/2023 10:26 AM

## 2023-10-12 NOTE — Telephone Encounter (Signed)
Please advise 

## 2023-10-14 ENCOUNTER — Other Ambulatory Visit: Payer: Self-pay

## 2023-10-14 DIAGNOSIS — E119 Type 2 diabetes mellitus without complications: Secondary | ICD-10-CM

## 2023-10-14 DIAGNOSIS — R7989 Other specified abnormal findings of blood chemistry: Secondary | ICD-10-CM

## 2023-10-14 NOTE — Telephone Encounter (Signed)
Pt was advised on labs and agreeable to referral to Endo, order placed. Please see note regarding Zetia as FYI

## 2023-11-15 ENCOUNTER — Encounter: Payer: Self-pay | Admitting: Family Medicine

## 2023-11-15 NOTE — Telephone Encounter (Signed)
 Care team updated and letter sent for eye exam notes.

## 2023-12-11 ENCOUNTER — Other Ambulatory Visit: Payer: Self-pay | Admitting: Allergy and Immunology

## 2024-01-10 ENCOUNTER — Ambulatory Visit: Payer: 59 | Admitting: Allergy and Immunology

## 2024-01-10 ENCOUNTER — Other Ambulatory Visit: Payer: Self-pay

## 2024-01-10 ENCOUNTER — Encounter: Payer: Self-pay | Admitting: Allergy and Immunology

## 2024-01-10 VITALS — BP 130/70 | HR 75 | Temp 99.7°F | Resp 18 | Wt 188.9 lb

## 2024-01-10 DIAGNOSIS — L2089 Other atopic dermatitis: Secondary | ICD-10-CM

## 2024-01-10 DIAGNOSIS — J3089 Other allergic rhinitis: Secondary | ICD-10-CM | POA: Diagnosis not present

## 2024-01-10 DIAGNOSIS — J4550 Severe persistent asthma, uncomplicated: Secondary | ICD-10-CM | POA: Diagnosis not present

## 2024-01-10 DIAGNOSIS — K219 Gastro-esophageal reflux disease without esophagitis: Secondary | ICD-10-CM | POA: Diagnosis not present

## 2024-01-10 NOTE — Progress Notes (Unsigned)
[place] - [place] - [place] - [place] - [place]   Follow-up Note   Referring Provider: Ardith Dark, MD Primary Provider: Ardith Dark, MD Date of Office Visit: 01/10/2024  Subjective:   Aaron Drake (DOB: 1955/12/07) is a 68 y.o. male who [facility] in re-evaluation of the following:  HPI: Aaron Drake returns to this clinic in reevaluation of asthma, allergic rhinitis, atopic dermatitis, reflux.  I last saw him in this clinic 05 July 2023.  He is really doing very well at this point in time regarding his airway.  He still uses a short acting bronchodilator a few times per day but overall he is very pleased with the response that he has received on his current plan which includes a combination of a leukotriene modifier and anti-TSLP antibody administered every 4 weeks.  He has not required a systemic steroid or an antibiotic for any type of airway issue.  His eczema has been under excellent control and he rarely uses any topical agents.  He discontinued his omeprazole.  Interestingly, he apparently had a low testosterone level and in evaluation of that issue had a very high prolactin level.  He discontinued his omeprazole as there are reported cases of hyperprolactinemia in association with omeprazole use and his level has actually dropped by over 50%.  He is currently using famotidine to treat his reflux and this is working very well.  He has improvement comes in the context of losing weight voluntarily.  He did obtain the flu vaccine this year.  Allergies as of 01/10/2024       Reactions   Airduo Digihaler [fluticasone-salmeterol] Other (See Comments)   Redness -Head to toe; skin Peeling - Head to to   Lipitor [atorvastatin] Other (See Comments)   Muscle cramps and joint ache        Medication List    albuterol 108 (90 Base) MCG/ACT inhaler Commonly known as: VENTOLIN HFA USE 2 INHALATIONS ORALLY   EVERY 4  HOURS AS NEEDED FORWHEEZING OR SHORTNESS OF   BREATH.   CALCIUM + D3 PO Take 1 tablet by mouth daily.   EPINEPHrine 0.3 mg/0.3 mL Soaj injection Commonly known as: EpiPen 2-Pak Inject 0.3 mg into the muscle as needed for anaphylaxis.   finasteride 5 MG tablet Commonly known as: PROSCAR Take 5 mg by mouth daily.   levalbuterol 45 MCG/ACT inhaler Commonly known as: XOPENEX HFA Inhale 1 puff into the lungs every 6 (six) hours as needed for wheezing.   loratadine 10 MG tablet Commonly known as: CLARITIN Take 1 tablet (10 mg total) by mouth daily.   meclizine 12.5 MG tablet Commonly known as: ANTIVERT Take 1 tablet by mouth every 6 hours if needed for vertigo   montelukast 10 MG tablet Commonly known as: SINGULAIR TAKE 1 TABLET AT BEDTIME   multivitamin with minerals Tabs tablet Take 1 tablet by mouth daily.   omeprazole 20 MG capsule Commonly known as: PRILOSEC TAKE 1 CAPSULE EVERY       MORNING   Ozempic (2 MG/DOSE) 8 MG/3ML Sopn Generic drug: Semaglutide (2 MG/DOSE) INJECT 2MG  SUBCUTANEOUSLY  WEEKLY (EVERY 7 DAYS) AS   DIRECTED   phenazopyridine 200 MG tablet Commonly known as: Pyridium Take 1 tablet (200 mg total) by mouth 3 (three) times daily as needed for pain.   Testosterone 12.5 MG/ACT (1%) Gel Place 1 Pump onto the skin daily.   Tezspire 210 MG/1. Soaj Generic drug: Tezepelumab-ekko  Inject 210 mg into the skin every 28 (twenty-eight) days.    Past Medical History:  Diagnosis Date   Allergy    Arthritis    Asthma 05/12/2012   BPH (benign prostatic hyperplasia) 05/19/2012   Diabetes mellitus without complication (HCC)    Dyspnea    physical stress; exercise    History of kidney stones    Hyperlipidemia    Impaired glucose tolerance 05/19/2012   Tear of meniscus of right knee 2018   partial tear    Past Surgical History:  Procedure Laterality Date   BALLOON DILATION N/A 06/10/2022   Procedure: URETHRAL BALLOON DILATION-OPTILUME;  Surgeon:  Crist Fat, MD;  Location: [facility];  Service: Urology;  Laterality: N/A;   CATARACT EXTRACTION Right    CYSTOSCOPY WITH RETROGRADE URETHROGRAM N/A 06/10/2022   Procedure: CYSTOSCOPY WITH RETROGRAM URETHROGRAM;  Surgeon: Crist Fat, MD;  Location: [facility];  Service: Urology;  Laterality: N/A;   EXTRACORPOREAL SHOCK WAVE LITHOTRIPSY Left 05/08/2018   Procedure: LEFT EXTRACORPOREAL SHOCK WAVE LITHOTRIPSY (ESWL);  Surgeon: Jerilee Field, MD;  Location: [name];  Service: Urology;  Laterality: Left;   INSERTION OF MESH N/A 05/17/2016   Procedure: INSERTION OF MESH;  Surgeon: Emelia Loron, MD;  Location: [facility];  Service: General;  Laterality: N/A;   UMBILICAL HERNIA REPAIR N/A 05/17/2016   Procedure: UMBILICAL HERNIA REPAIR;  Surgeon: Emelia Loron, MD;  Location: [facility];  Service: General;  Laterality: N/A;   WISDOM TOOTH EXTRACTION      Review of systems negative except as noted in HPI / PMHx or noted below:  Review of Systems  Constitutional: Negative.   HENT: Negative.    Eyes: Negative.   Respiratory: Negative.    Cardiovascular: Negative.   Gastrointestinal: Negative.   Genitourinary: Negative.   Musculoskeletal: Negative.   Skin: Negative.   Neurological: Negative.   Endo/Heme/Allergies: Negative.   Psychiatric/Behavioral: Negative.       Objective:   Vitals:   01/10/24 0846  BP: 130/70  Pulse: 75  Resp: 18  Temp: 99.7 F (37.6 C)  SpO2: 96%      Weight: 188 lb 14.4 oz (85.7 kg)   Physical Exam Constitutional:      Appearance: He is not diaphoretic.  HENT:     Head: Normocephalic.     Right Ear: Tympanic membrane, ear canal and external ear normal.     Left Ear: Tympanic membrane, ear canal and external ear normal.     Nose: Nose normal. No mucosal edema or rhinorrhea.     Mouth/Throat:     Pharynx: Uvula midline. No oropharyngeal exudate.  Eyes:     Conjunctiva/sclera: Conjunctivae normal.  Neck:      Thyroid: No thyromegaly.     Trachea: Trachea normal. No tracheal tenderness or tracheal deviation.  Cardiovascular:     Rate and Rhythm: Normal rate and regular rhythm.     Heart sounds: Normal heart sounds, S1 normal and S2 normal. No murmur heard. Pulmonary:     Effort: No respiratory distress.     Breath sounds: Normal breath sounds. No stridor. No wheezing or rales.  Lymphadenopathy:     Head:     Right side of head: No tonsillar adenopathy.     Left side of head: No tonsillar adenopathy.     Cervical: No cervical adenopathy.  Skin:    Findings: No erythema or rash.     Nails: There is no clubbing.  Neurological:  Mental Status: He is alert.     Diagnostics: Spirometry was performed and demonstrated an FEV1 of 1.78 at 67 % of predicted.  Assessment and Plan:   1. Asthma, severe persistent, well-controlled   2. Other allergic rhinitis   3. Other atopic dermatitis   4. Gastroesophageal reflux disease, unspecified whether esophagitis present    1. Continue tezepelumab injections  2. Continue montelukast 10 mg -1 tablet 1 time per day  3. Continue OTC famotidine  4. Continue Claritin and Albuterol HFA and topical treatment if needed.  5. Return to clinic in 12 months or earlier if problem  6. Influenza = Tamiflu. Covid = Paxlovid  Aaron Drake appears to be doing quite well and he will continue on his tezepelumab injections and a leukotriene modifier on a consistent basis to address his respiratory tract inflammatory condition.  He will continue on famotidine for his reflux.  There are several agents that he can utilize should they be required.  Assuming he does well with this plan we will see him back in this clinic in 1 year or earlier if there is a problem.   Laurette Schimke, MD Allergy / Immunology [facility]

## 2024-01-10 NOTE — Patient Instructions (Signed)
  1. Continue tezepelumab injections  2. Continue montelukast 10 mg -1 tablet 1 time per day  3. Continue OTC famotidine  4. Continue Claritin and Albuterol HFA and topical treatment if needed.  5. Return to clinic in 12 months or earlier if problem  6. Influenza = Tamiflu. Covid = Paxlovid

## 2024-01-11 ENCOUNTER — Encounter: Payer: Self-pay | Admitting: Allergy and Immunology

## 2024-02-09 ENCOUNTER — Ambulatory Visit: Payer: 59 | Admitting: Endocrinology

## 2024-02-09 ENCOUNTER — Encounter: Payer: Self-pay | Admitting: Endocrinology

## 2024-02-09 ENCOUNTER — Other Ambulatory Visit: Payer: Self-pay | Admitting: Allergy and Immunology

## 2024-02-09 VITALS — BP 136/60 | HR 67 | Resp 20 | Ht 64.0 in | Wt 190.4 lb

## 2024-02-09 DIAGNOSIS — E221 Hyperprolactinemia: Secondary | ICD-10-CM

## 2024-02-09 NOTE — Progress Notes (Signed)
 Outpatient Endocrinology Note Aaron Aminata [place], MD   Patient's Name: Aaron Drake    DOB: [birth year]    MRN: [id number]  REASON OF VISIT: New consult for hyperprolactinemia.  REFERRING PROVIDER: Ardith Dark, MD   PCP: Ardith Dark, MD  HISTORY OF PRESENT ILLNESS:   Aaron Drake is a 68 y.o. old male with past medical history listed below, is here for new consult for hyperprolactinemia.  Pertinent History: Patient states was diagnosed with hypogonadism in 2024, he was evaluated when he was having difficulty losing weight, no details record available to review.  Patient has been on testosterone gel daily from October 2024, being monitored and managed by urology.  During evaluation of hypogonadism prolactin was checked and was elevated, patient reports it was 72 at that time.  Prolactin level was rechecked in November 2024 was 36 with upper normal limit of 18.  Patient brought medical records from urology office and had lab work done on December 14, 2023 with prolactin level was 43.7 elevated with upper normal limit of 17.7.  Patient had total testosterone of 377 in the normal range at that time.  Patient has no thyroid disorder, had normal thyroid function test multiple times in the past including normal TSH of 0.79 in November 2024.  Patient has not been on mental health medication including antipsychotic medication.  He was taking omeprazole for almost 5 years and he stopped around January 2025, after the prolactin has been January 2025.  He had MRI pituitary in July 2024 with normal appearance of pituitary gland with no pituitary adenoma.  CLINICAL DATA:  Pituitary adenoma or prolactinoma.   EXAM: June 17, 2023 : Reviewed images. MRI HEAD WITHOUT AND WITH CONTRAST   TECHNIQUE: Multiplanar, multiecho pulse sequences of the brain and surrounding structures were obtained without and with intravenous contrast.   CONTRAST:  10 cc Vueway   COMPARISON:  None Available.    FINDINGS: Brain: Diffusion imaging is normal. No abnormality affects the brainstem or cerebellum. Cerebral hemispheres show mild age related volume loss with minimal small vessel change of the white matter. No cortical or large vessel territory infarction. No mass, hemorrhage, hydrocephalus or extra-axial collection.   The pituitary gland is normal in size. The upper surface is concave. The infundibulum is midline. There is no macro adenoma. No visible micro adenoma.   Vascular: Major vessels at the base of the brain show flow.   Skull and upper cervical spine: Negative   Sinuses/Orbits: Clear/normal   Other: None   IMPRESSION: 1. Normal appearance of the brain for age except for minimal small vessel change of the cerebral hemispheric white matter. 2. Normal appearance of the pituitary gland. No macro adenoma or visible micro adenoma.   Latest Reference Range & Units 10/06/23 11:58  Prolactin 2.0 - 18.0 ng/mL 36.0 (H)  (H): Data is abnormally high  Patient has no breast tissue, no discharge from the breast.  He has mostly fatty enlargement of the breast area, has noticed decrease in the size lately with losing weight with lifestyle modification with diet and exercise.  No new headache or peripheral vision loss.  Labs reviewed from outside facility/urology clinic as follows.  Lab completed on 12/14/2023  Prolactin 43.7 ( 2.1 - 17.7)  Total testosterone 377.6 ( 300 - 890)  REVIEW OF SYSTEMS:  As per history of present illness.   PAST MEDICAL HISTORY: Past Medical History:  Diagnosis Date   Allergy    Arthritis    Asthma  05/12/2012   BPH (benign prostatic hyperplasia) 05/19/2012   Diabetes mellitus without complication (HCC)    Dyspnea    physical stress; exercise    History of kidney stones    Hyperlipidemia    Impaired glucose tolerance 05/19/2012   Tear of meniscus of right knee 2018   partial tear    PAST SURGICAL HISTORY: Past Surgical History:   Procedure Laterality Date   BALLOON DILATION N/A 06/10/2022   Procedure: URETHRAL BALLOON DILATION-OPTILUME;  Surgeon: Crist Fat, MD;  Location: [facility];  Service: Urology;  Laterality: N/A;   CATARACT EXTRACTION Right    CYSTOSCOPY WITH RETROGRADE URETHROGRAM N/A 06/10/2022   Procedure: CYSTOSCOPY WITH RETROGRAM URETHROGRAM;  Surgeon: Crist Fat, MD;  Location: [facility];  Service: Urology;  Laterality: N/A;   EXTRACORPOREAL SHOCK WAVE LITHOTRIPSY Left 05/08/2018   Procedure: LEFT EXTRACORPOREAL SHOCK WAVE LITHOTRIPSY (ESWL);  Surgeon: Jerilee Field, MD;  Location: [name];  Service: Urology;  Laterality: Left;   INSERTION OF MESH N/A 05/17/2016   Procedure: INSERTION OF MESH;  Surgeon: Emelia Loron, MD;  Location: [facility];  Service: General;  Laterality: N/A;   UMBILICAL HERNIA REPAIR N/A 05/17/2016   Procedure: UMBILICAL HERNIA REPAIR;  Surgeon: Emelia Loron, MD;  Location: [facility];  Service: General;  Laterality: N/A;   WISDOM TOOTH EXTRACTION      ALLERGIES: Allergies  Allergen Reactions   Airduo Digihaler [Fluticasone-Salmeterol] Other (See Comments)    Redness -Head to toe; skin Peeling - Head to to   Lipitor [Atorvastatin] Other (See Comments)    Muscle cramps and joint ache    FAMILY HISTORY:  Family History  Problem Relation Age of Onset   Heart disease Mother    Lung cancer Mother    Stroke Father    COPD Father        Smoker   Aortic aneurysm Father    Colon cancer Maternal Uncle     SOCIAL HISTORY: Social History   Socioeconomic History   Marital status: Single    Spouse name: Not on file   Number of children: Not on file   Years of education: Not on file   Highest education level: Master's degree (e.g., MA, MS, MEng, MEd, MSW, MBA)  Occupational History   Occupation: catholic priest  Tobacco Use   Smoking status: Former    Current packs/day: 0.00    Types: Cigarettes    Quit date: 1998     Years since quitting: 27.2   Smokeless tobacco: Never  Vaping Use   Vaping status: Never Used  Substance and Sexual Activity   Alcohol use: Yes    Alcohol/week: 1.0 standard drink of alcohol    Types: 1 Shots of liquor per week    Comment: rare   Drug use: No   Sexual activity: Not Currently  Other Topics Concern   Not on file  Social History Narrative   Not on file   Social Drivers of Health   Financial Resource Strain: Low Risk  (03/17/2023)   Overall Financial Resource Strain (CARDIA)    Difficulty of Paying Living Expenses: Not hard at all  Food Insecurity: No Food Insecurity (03/17/2023)   Hunger Vital Sign    Worried About Running Out of Food in the Last Year: Never true    Ran Out of Food in the Last Year: Never true  Transportation Needs: No Transportation Needs (03/17/2023)   PRAPARE - Administrator, Civil Service (Medical): No  Lack of Transportation (Non-Medical): No  Physical Activity: Insufficiently Active (03/17/2023)   Exercise Vital Sign    Days of Exercise per Week: 5 days    Minutes of Exercise per Session: 20 min  Stress: No Stress Concern Present (03/17/2023)   [employer] of Occupational Health - Occupational Stress Questionnaire    Feeling of Stress : Not at all  Social Connections: Socially Isolated (03/17/2023)   Social Connection and Isolation Panel [NHANES]    Frequency of Communication with Friends and Family: Never    Frequency of Social Gatherings with Friends and Family: Never    Attends Religious Services: More than 4 times per year    Active Member of [employer] or Organizations: No    Attends Engineer, structural: Not on file    Marital Status: Never married    MEDICATIONS:  Current Outpatient Medications  Medication Sig Dispense Refill   Calcium Carb-Cholecalciferol (CALCIUM + D3 PO) Take 1 tablet by mouth daily.     EPINEPHrine (EPIPEN 2-PAK) 0.3 mg/0.3 mL IJ SOAJ injection Inject 0.3 mg into the muscle as needed  for anaphylaxis. 1 each 1   finasteride (PROSCAR) 5 MG tablet Take 5 mg by mouth daily.     levalbuterol (XOPENEX HFA) 45 MCG/ACT inhaler Inhale 1 puff into the lungs every 6 (six) hours as needed for wheezing. 15 g 3   levalbuterol (XOPENEX HFA) 45 MCG/ACT inhaler Inhale 2 puffs into the lungs every 4 (four) hours as needed for wheezing. 45 g 1   loratadine (CLARITIN) 10 MG tablet Take 1 tablet (10 mg total) by mouth daily. 90 tablet 1   montelukast (SINGULAIR) 10 MG tablet TAKE 1 TABLET AT BEDTIME 90 tablet 1   Multiple Vitamin (MULTIVITAMIN WITH MINERALS) TABS tablet Take 1 tablet by mouth daily.     omeprazole (PRILOSEC) 20 MG capsule TAKE 1 CAPSULE EVERY       MORNING 90 capsule 1   OZEMPIC, 2 MG/DOSE, 8 MG/3ML SOPN INJECT 2MG  SUBCUTANEOUSLY  WEEKLY (EVERY 7 DAYS) AS   DIRECTED 9 mL 3   phenazopyridine (PYRIDIUM) 200 MG tablet Take 1 tablet (200 mg total) by mouth 3 (three) times daily as needed for pain. 10 tablet 0   Testosterone 12.5 MG/ACT (1%) GEL Place 1 Pump onto the skin daily. 75 g 5   Tezepelumab-ekko (TEZSPIRE) 210 MG/1. SOAJ Inject 210 mg into the skin every 28 (twenty-eight) days. 1.91 mL 11   meclizine (ANTIVERT) 12.5 MG tablet Take 1 tablet by mouth every 6 hours if needed for vertigo 180 tablet 0   Current Facility-Administered Medications  Medication Dose Route Frequency Provider Last Rate Last Admin   tezepelumab-ekko (TEZSPIRE) 210 MG/1. syringe 210 mg  210 mg Subcutaneous Q28 days Jessica Priest, MD   210 mg at 05/20/22 1129    PHYSICAL EXAM: Vitals:   02/09/24 1352  BP: 136/60  Pulse: 67  Resp: 20  SpO2: 97%  Weight: 190 lb 6.4 oz (86.4 kg)  Height: 5\' 4"  (1.626 m)   Body mass index is 32.68 kg/m.  Wt Readings from Last 3 Encounters:  02/09/24 190 lb 6.4 oz (86.4 kg)  01/10/24 188 lb 14.4 oz (85.7 kg)  10/06/23 193 lb (87.5 kg)    General: Well developed, well nourished male in no apparent distress. No cushingoid and acromegalic appearance HEENT:  AT/[place], no external lesions. Hearing intact to the spoken word Eyes: EOMI. Conjunctiva clear and no icterus. Visual Field by confrontation grossly intact Neck: Trachea midline,  neck supple without appreciable thyromegaly or lymphadenopathy and no palpable thyroid nodules Lungs: Clear to auscultation, no wheeze. Respirations not labored Heart: S1S2, Regular in rate and rhythm. No loud murmurs Abdomen: Soft, non tender, non distended, no masses, no striae Neurologic: Alert, oriented, normal speech, deep tendon biceps reflexes normal,  no gross focal neurological deficit Extremities: No pedal pitting edema, no tremors of outstretched hands Skin: Warm, color good.  No gynecomastia noted. Psychiatric: Does not appear depressed or anxious  PERTINENT HISTORIC LABORATORY AND IMAGING STUDIES:  All pertinent laboratory results were reviewed. Please see HPI also for further details.   ASSESSMENT / PLAN  1. Hyperprolactinemia (HCC)     Patient has mild hyperprolactinemia, unknown etiology.  He had MRI pituitary in July 2024 with unremarkable pituitary gland with no pituitary adenoma.  He has not been on antipsychotic medication.  No galactorrhea.  He was taking omeprazole until recently was stopped in January 2025.  It has been rarely noted that omeprazole can raise prolactin level as well.  Pituitary adenoma has been ruled out.  Mild elevation of prolactin has not been bothering him.  In this clinical context no medication treatment is required.  However cabergoline can be considered for short-term.  Patient prefers not to be on medication if not absolutely necessary.  Plan: -I would like to recheck prolactin along with macroprolactin in 2 months.  Presence of macroprolactin can falsely increase the level of prolactin which does not need treatment and monitoring. -Patient is asked to complete lab in the early morning fasting.   # Patient has type 2 diabetes mellitus controlled on Ozempic, managed  by primary care provider.  He will continue to follow-up with primary care provider. # Patient has hypogonadism currently on testosterone gel therapy, following with urology.    Diagnoses and all orders for this visit:  Hyperprolactinemia (HCC) -     Prolactin -     Macroprolactin    DISPOSITION Follow up in clinic in to be determined based on the above lab plan.  All questions answered and patient verbalized understanding of the plan.  Aaron Travarus Trudo, MD [facility] Endocrinology [facility] [address] [place], [address] [place], [place] [address] Phone # [phone]  At least part of this note was generated using voice recognition software. Inadvertent word errors may have occurred, which were not recognized during the proofreading process.

## 2024-02-09 NOTE — Telephone Encounter (Signed)
 Pts insurance does not cover albuterol prefers xopenex is it ok

## 2024-02-17 ENCOUNTER — Encounter: Payer: Self-pay | Admitting: Allergy and Immunology

## 2024-02-17 ENCOUNTER — Telehealth: Payer: Self-pay | Admitting: Allergy and Immunology

## 2024-02-17 NOTE — Telephone Encounter (Signed)
 Forwarding message to provider - office notes /Allergy List does not show patient being allergic to Levalbuterol (Xopenex).

## 2024-02-17 NOTE — Telephone Encounter (Signed)
 Maya call [facility] called about medication  levalbuterol levalbuterol (XOPENEX HFA) 45 MCG/ACT inhaler and wanted to know if it was okay to dispense medication since he is allergic to it and requested a call back at [phone] Option 2. [phone] Reference #

## 2024-02-21 NOTE — Telephone Encounter (Signed)
 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   Called patient - DOB verified - stated he is not allergic to Levoabuterol but prefers Albuterol better.  Patient stated he has both - Levoalbuterol and Albuterol - Albuterol works much better - if insurance will cover Albuterol, he prefers that.  Patient advised updated message would be forwarded to provider.  Patient verbalized understanding, no further questions.

## 2024-02-22 NOTE — Telephone Encounter (Signed)
 Per Provider:  Provide albuterol if insurance covers. If not provide levoalbuterol    Called [phone] - spoke to Luisa Hart - DOB verified -   [phone] Option 2. [id number] Reference #       Per Luisa Hart, Albuterol (Proventil) inhaler is covered - gave verbal approval to discontinue Levoalbuterol (Xopenex) HFA inhaler and fill Albuterol ( Proventil) HFA inhaler - 90 day supply w/RF 3.   Called patient  - DOB verified/NEED DPR - LMOVM advising of above notation.  Forwarding updated message to provider.

## 2024-03-17 ENCOUNTER — Other Ambulatory Visit: Payer: Self-pay | Admitting: Family Medicine

## 2024-04-05 ENCOUNTER — Encounter: Payer: Self-pay | Admitting: Family Medicine

## 2024-04-05 ENCOUNTER — Ambulatory Visit: Payer: 59 | Admitting: Family Medicine

## 2024-04-05 VITALS — BP 121/64 | HR 68 | Temp 97.7°F | Ht 64.0 in | Wt 184.4 lb

## 2024-04-05 DIAGNOSIS — I152 Hypertension secondary to endocrine disorders: Secondary | ICD-10-CM

## 2024-04-05 DIAGNOSIS — E1159 Type 2 diabetes mellitus with other circulatory complications: Secondary | ICD-10-CM | POA: Diagnosis not present

## 2024-04-05 DIAGNOSIS — K219 Gastro-esophageal reflux disease without esophagitis: Secondary | ICD-10-CM

## 2024-04-05 DIAGNOSIS — G720 Drug-induced myopathy: Secondary | ICD-10-CM

## 2024-04-05 DIAGNOSIS — E119 Type 2 diabetes mellitus without complications: Secondary | ICD-10-CM

## 2024-04-05 DIAGNOSIS — Z7985 Long-term (current) use of injectable non-insulin antidiabetic drugs: Secondary | ICD-10-CM

## 2024-04-05 LAB — POCT GLYCOSYLATED HEMOGLOBIN (HGB A1C): Hemoglobin A1C: 5.5 % (ref 4.0–5.6)

## 2024-04-05 NOTE — Progress Notes (Signed)
   Aaron Drake is a 68 y.o. male who presents today for an office visit.  Assessment/Plan:  Chronic Problems Addressed Today: Type 2 diabetes mellitus without complication, without long-term current use of insulin  (HCC) A1c well-controlled 5.5 and Ozempic  2 mg weekly.  He is doing a great job with diet and exercise and has lost about 9 pounds since her last visit.  Will continue with current regimen.  Recheck in 6 months at CPE.  Hypertension associated with diabetes (HCC) At goal today without meds.  Gastroesophageal reflux disease Doing well off Prilosec.  Now on Pepcid  as needed.  Doing well with this regimen.  Drug-induced myopathy Cannot tolerate statins.  He is working on lifestyle interventions.  Preventive healthcare Declined rechecking UACR today.    Subjective:  HPI:  See Assessment / plan for status of chronic conditions.  Patient is here today for follow-up.  He was last here 6 months ago for annual physical.  A1c at that time was well-controlled on Ozempic  2 mg weekly.  He was also found to have elevated prolactin and we referred him to see endocrinology.  They recommended that he stop he omeprazole . He is now on Pepcid  and is doing well with this.        Objective:  Physical Exam: BP 121/64   Pulse 68   Temp 97.7 F (36.5 C) (Temporal)   Ht 5\' 4"  (1.626 m)   Wt 184 lb 6.4 oz (83.6 kg)   SpO2 96%   BMI 31.65 kg/m   Wt Readings from Last 3 Encounters:  04/05/24 184 lb 6.4 oz (83.6 kg)  02/09/24 190 lb 6.4 oz (86.4 kg)  01/10/24 188 lb 14.4 oz (85.7 kg)     Gen: No acute distress, resting comfortably CV: Regular rate and rhythm with no murmurs appreciated Pulm: Normal work of breathing, clear to auscultation bilaterally with no crackles, wheezes, or rhonchi Neuro: Grossly normal, moves all extremities Psych: Normal affect and thought content      Enjoli Tidd M. Daneil Dunker, MD 04/05/2024 10:58 AM

## 2024-04-05 NOTE — Patient Instructions (Signed)
 It was very nice to see you today!  You are doing a great job.  Please keep up the great work.  Your A1c looks great today.  Will see you back in 6 months for your physical.  Come back sooner if needed.  Return in about 6 months (around 10/06/2024) for Annual Physical.   Take care, Dr Daneil Dunker  PLEASE NOTE:  If you had any lab tests, please let us  know if you have not heard back within a few days. You may see your results on mychart before we have a chance to review them but we will give you a call once they are reviewed by us .   If we ordered any referrals today, please let us  know if you have not heard from their office within the next week.   If you had any urgent prescriptions sent in today, please check with the pharmacy within an hour of our visit to make sure the prescription was transmitted appropriately.   Please try these tips to maintain a healthy lifestyle:  Eat at least 3 REAL meals and 1-2 snacks per day.  Aim for no more than 5 hours between eating.  If you eat breakfast, please do so within one hour of getting up.   Each meal should contain half fruits/vegetables, one quarter protein, and one quarter carbs (no bigger than a computer mouse)  Cut down on sweet beverages. This includes juice, soda, and sweet tea.   Drink at least 1 glass of water  with each meal and aim for at least 8 glasses per day  Exercise at least 150 minutes every week.

## 2024-04-05 NOTE — Assessment & Plan Note (Signed)
 Doing well off Prilosec.  Now on Pepcid  as needed.  Doing well with this regimen.

## 2024-04-05 NOTE — Assessment & Plan Note (Signed)
At goal today without meds. 

## 2024-04-05 NOTE — Assessment & Plan Note (Signed)
 A1c well-controlled 5.5 and Ozempic  2 mg weekly.  He is doing a great job with diet and exercise and has lost about 9 pounds since her last visit.  Will continue with current regimen.  Recheck in 6 months at CPE.

## 2024-04-05 NOTE — Assessment & Plan Note (Signed)
 Cannot tolerate statins.  He is working on lifestyle interventions.

## 2024-04-10 ENCOUNTER — Other Ambulatory Visit

## 2024-04-10 ENCOUNTER — Other Ambulatory Visit: Payer: Self-pay

## 2024-04-10 DIAGNOSIS — E221 Hyperprolactinemia: Secondary | ICD-10-CM

## 2024-04-10 NOTE — Telephone Encounter (Signed)
 ERROR

## 2024-04-11 ENCOUNTER — Other Ambulatory Visit

## 2024-04-15 LAB — PROLACTIN, TOTAL AND MONOMERIC
Prolactin, Monomeric: 38.6 ng/mL — ABNORMAL HIGH (ref 3.4–14.8)
Prolactin, Total: 46.8 ng/mL — ABNORMAL HIGH (ref 2.0–18.0)

## 2024-04-17 ENCOUNTER — Ambulatory Visit: Payer: Self-pay | Admitting: Endocrinology

## 2024-05-07 ENCOUNTER — Encounter: Payer: Self-pay | Admitting: Family Medicine

## 2024-05-07 NOTE — Telephone Encounter (Signed)
 See message, send message to patient to schedule an OV with PCP

## 2024-05-07 NOTE — Telephone Encounter (Signed)
 Agree with him coming in for office visit and xray.  Jinny Mounts. Daneil Dunker, MD 05/07/2024 2:59 PM

## 2024-05-08 ENCOUNTER — Ambulatory Visit: Admitting: Family Medicine

## 2024-05-08 ENCOUNTER — Encounter: Payer: Self-pay | Admitting: Family Medicine

## 2024-05-08 ENCOUNTER — Ambulatory Visit (INDEPENDENT_AMBULATORY_CARE_PROVIDER_SITE_OTHER)

## 2024-05-08 VITALS — BP 124/70 | HR 86 | Temp 97.0°F | Ht 64.0 in | Wt 186.0 lb

## 2024-05-08 DIAGNOSIS — M79671 Pain in right foot: Secondary | ICD-10-CM | POA: Diagnosis not present

## 2024-05-08 NOTE — Progress Notes (Signed)
   Aaron Drake is a 68 y.o. male who presents today for an office visit.  Assessment/Plan:  Right Foot Pain  He has a large hematoma on exam today.  No signs or symptoms of compartment syndrome. Concern for potential underlying fracture given his recent trauma.  Will check plain film to rule this out. We attempted to place patient in postop shoe today however he felt that it was too uncomfortable and elected not to use this.  Instructed patient to keep his foot elevated and also use warm compresses to the area to help with the hematoma.  If his x-ray is negative, anticipate that this will continue to improve over the next few weeks however if x-ray does show fracture he will need to be referred to orthopedics.  We discussed reasons to return to care.    Subjective:  HPI:  See A/P for status of chronic conditions.  Patient is here today with right foot pain.  Pain started a couple of weeks ago after dropping a 15 pound dumbbell directly on dorsal aspect of the foot.  Immediately had pain and swelling to the area.  A lot of bruising.  Symptoms improved over a few days but for the last 1 to 2 weeks he has had persistent swelling to the area.  Does not have much pain at rest though does experience more pain when he is up and walking and moving around.  He has been trying to keep his leg elevated.        Objective:  Physical Exam: BP 124/70   Pulse 86   Temp (!) 97 F (36.1 C) (Temporal)   Ht 5\' 4"  (1.626 m)   Wt 186 lb (84.4 kg)   SpO2 94%   BMI 31.93 kg/m   Gen: No acute distress, resting comfortably MUSCULOSKELETAL - Right Foot: Large approximately 4 cm hematoma on dorsal aspect of right foot.  A few healing lacerations noted as well.  Neurovascularly intact distally.  Good distal cap refill.  Sensation light touch intact throughout.  Proximal 2nd through 3rd metacarpal tender to palpation. Neuro: Grossly normal, moves all extremities Psych: Normal affect and thought content       Aaron Drake M. Daneil Dunker, MD 05/08/2024 7:49 AM

## 2024-05-08 NOTE — Patient Instructions (Signed)
 It was very nice to see you today!  We will check an x-ray to make sure that you do not have any fracture.  Please wear the postop shoe until your pain resolves.  Please try to keep your leg elevated is much as possible.  Return if symptoms worsen or fail to improve.   Take care, Dr Daneil Dunker  PLEASE NOTE:  If you had any lab tests, please let us  know if you have not heard back within a few days. You may see your results on mychart before we have a chance to review them but we will give you a call once they are reviewed by us .   If we ordered any referrals today, please let us  know if you have not heard from their office within the next week.   If you had any urgent prescriptions sent in today, please check with the pharmacy within an hour of our visit to make sure the prescription was transmitted appropriately.   Please try these tips to maintain a healthy lifestyle:  Eat at least 3 REAL meals and 1-2 snacks per day.  Aim for no more than 5 hours between eating.  If you eat breakfast, please do so within one hour of getting up.   Each meal should contain half fruits/vegetables, one quarter protein, and one quarter carbs (no bigger than a computer mouse)  Cut down on sweet beverages. This includes juice, soda, and sweet tea.   Drink at least 1 glass of water  with each meal and aim for at least 8 glasses per day  Exercise at least 150 minutes every week.

## 2024-05-09 ENCOUNTER — Ambulatory Visit: Admitting: Endocrinology

## 2024-05-09 ENCOUNTER — Encounter: Payer: Self-pay | Admitting: Endocrinology

## 2024-05-09 ENCOUNTER — Ambulatory Visit: Payer: Self-pay | Admitting: Family Medicine

## 2024-05-09 VITALS — BP 126/70 | HR 78 | Resp 20 | Ht 64.0 in | Wt 186.8 lb

## 2024-05-09 DIAGNOSIS — E221 Hyperprolactinemia: Secondary | ICD-10-CM | POA: Diagnosis not present

## 2024-05-09 NOTE — Progress Notes (Signed)
 His x-ray did not show any fracture.  He does have arthritis in his feet but this expected for a person his age.  As we discussed at his office visit he has a hematoma.  This should gradually improve over the next few weeks.  He should let us  know if not improving and we can refer him to see orthopedics.

## 2024-05-09 NOTE — Progress Notes (Signed)
 Outpatient Endocrinology Note Aaron Estel Scholze, MD   Patient's Name: Aaron Drake    DOB: [birth year]    MRN: [id number]  REASON OF VISIT: Follow-up for hyperprolactinemia.  REFERRING PROVIDER: Rodney Clamp, MD   PCP: Rodney Clamp, MD  HISTORY OF PRESENT ILLNESS:   Aaron Drake is a 68 y.o. old male with past medical history listed below, is here for follow-up for hyperprolactinemia.  Pertinent History: Patient states was diagnosed with hypogonadism in 2024, he was evaluated when he was having difficulty losing weight, no details record available to review.  Patient has been on testosterone  gel daily from October 2024, being monitored and managed by urology.  During evaluation of hypogonadism prolactin was checked and was elevated, patient reports it was 72 at that time.  Prolactin level was rechecked in November 2024 was 36 with upper normal limit of 18.  Patient brought medical records from urology office and had lab work done on December 14, 2023 with prolactin level was 43.7 elevated with upper normal limit of 17.7.  Patient had total testosterone  of 377 in the normal range at that time.  Patient has no thyroid  disorder, had normal thyroid  function test multiple times in the past including normal TSH of 0.79 in November 2024.  Patient has not been on mental health medication including antipsychotic medication.  He was taking omeprazole  for almost 5 years and he stopped around January 2025, after the prolactin has been January 2025.  He had MRI pituitary in July 2024 with normal appearance of pituitary gland with no pituitary adenoma.   Latest Reference Range & Units 10/06/23 11:58  Prolactin 2.0 - 18.0 ng/mL 36.0 (H)  (H): Data is abnormally high  Patient has no breast tissue, no discharge from the breast.  He has mostly fatty enlargement of the breast area, has noticed decrease in the size lately with losing weight with lifestyle modification with diet and exercise.  No new  headache or peripheral vision loss.  Labs reviewed from outside facility/urology clinic as follows.  Lab completed on 12/14/2023  Prolactin 43.7 ( 2.1 - 17.7)  Total testosterone  377.6 ( 300 - 890)  # Hypogonadism he has been on testosterone  therapy in the form of testosterone  gel, following with urology.  # He has type 2 diabetes mellitus currently on Ozempic , following with primary care provider.  Interval history: Patient denies galactorrhea or breast tissue.  No new headache or vision problem.  He stopped taking omeprazole  from February.  Prolactin remained elevated however in the a stable range.  Macroprolactin is negative.  Labs checked last month as follows.  No other complaints today.   Latest Reference Range & Units 04/10/24 08:41  Prolactin, Monomeric 3.4 - 14.8 ng/mL 38.6 (H)  Prolactin, Total 2.0 - 18.0 ng/mL 46.8 (H)  (H): Data is abnormally high  REVIEW OF SYSTEMS:  As per history of present illness.   PAST MEDICAL HISTORY: Past Medical History:  Diagnosis Date   Allergy    Arthritis    Asthma 05/12/2012   BPH (benign prostatic hyperplasia) 05/19/2012   Diabetes mellitus without complication (HCC)    Dyspnea    physical stress; exercise    History of kidney stones    Hyperlipidemia    Impaired glucose tolerance 05/19/2012   Tear of meniscus of right knee 2018   partial tear    PAST SURGICAL HISTORY: Past Surgical History:  Procedure Laterality Date   BALLOON DILATION N/A 06/10/2022   Procedure: URETHRAL BALLOON DILATION-OPTILUME;  Surgeon: Andrez Banker, MD;  [facility];  Service: Urology;  Laterality: N/A;   CATARACT EXTRACTION Right    CYSTOSCOPY WITH RETROGRADE URETHROGRAM N/A 06/10/2022   Procedure: CYSTOSCOPY WITH RETROGRAM URETHROGRAM;  Surgeon: Andrez Banker, MD;  Location: [facility];  Service: Urology;  Laterality: N/A;   EXTRACORPOREAL SHOCK WAVE LITHOTRIPSY Left 05/08/2018   Procedure: LEFT  EXTRACORPOREAL SHOCK WAVE LITHOTRIPSY (ESWL);  Surgeon: Christina Coyer, MD;  Location: [name];  Service: Urology;  Laterality: Left;   INSERTION OF MESH N/A 05/17/2016   Procedure: INSERTION OF MESH;  Surgeon: Enid Harry, MD;  Location: [facility];  Service: General;  Laterality: N/A;   UMBILICAL HERNIA REPAIR N/A 05/17/2016   Procedure: UMBILICAL HERNIA REPAIR;  Surgeon: Enid Harry, MD;  Location: [facility];  Service: General;  Laterality: N/A;   WISDOM TOOTH EXTRACTION      ALLERGIES: Allergies  Allergen Reactions   Airduo Digihaler  [Fluticasone -Salmeterol] Other (See Comments)    Redness -Head to toe; skin Peeling - Head to to   Lipitor [Atorvastatin] Other (See Comments)    Muscle cramps and joint ache    FAMILY HISTORY:  Family History  Problem Relation Age of Onset   Heart disease Mother    Lung cancer Mother    Stroke Father    COPD Father        Smoker   Aortic aneurysm Father    Colon cancer Maternal Uncle     SOCIAL HISTORY: Social History   Socioeconomic History   Marital status: Single    Spouse name: Not on file   Number of children: Not on file   Years of education: Not on file   Highest education level: Master's degree (e.g., MA, MS, MEng, MEd, MSW, MBA)  Occupational History   Occupation: catholic priest  Tobacco Use   Smoking status: Former    Current packs/day: 0.00    Types: Cigarettes    Quit date: 1998    Years since quitting: 27.4   Smokeless tobacco: Never  Vaping Use   Vaping status: Never Used  Substance and Sexual Activity   Alcohol use: Yes    Alcohol/week: 1.0 standard drink of alcohol    Types: 1 Shots of liquor per week    Comment: rare   Drug use: No   Sexual activity: Not Currently  Other Topics Concern   Not on file  Social History Narrative   Not on file   Social Drivers of Health   Financial Resource Strain: Low Risk  (04/01/2024)   Overall Financial Resource Strain (CARDIA)    Difficulty of Paying Living  Expenses: Not hard at all  Food Insecurity: No Food Insecurity (04/01/2024)   Hunger Vital Sign    Worried About Running Out of Food in the Last Year: Never true    Ran Out of Food in the Last Year: Never true  Transportation Needs: No Transportation Needs (04/01/2024)   PRAPARE - Administrator, Civil Service (Medical): No    Lack of Transportation (Non-Medical): No  Physical Activity: Sufficiently Active (04/01/2024)   Exercise Vital Sign    Days of Exercise per Week: 5 days    Minutes of Exercise per Session: 60 min  Stress: No Stress Concern Present (04/01/2024)   [employer] of Occupational Health - Occupational Stress Questionnaire    Feeling of Stress : Not at all  Social Connections: Socially Isolated (04/01/2024)   Social Connection and Isolation Panel [NHANES]  Frequency of Communication with Friends and Family: Never    Frequency of Social Gatherings with Friends and Family: Never    Attends Religious Services: More than 4 times per year    Active Member of [employer] or Organizations: No    Attends Engineer, structural: Not on file    Marital Status: Never married    MEDICATIONS:  Current Outpatient Medications  Medication Sig Dispense Refill   Calcium  Carb-Cholecalciferol  (CALCIUM  + D3 PO) Take 1 tablet by mouth daily.     EPINEPHrine  (EPIPEN  2-PAK) 0.3 mg/0.3 mL IJ SOAJ injection Inject 0.3 mg into the muscle as needed for anaphylaxis. 1 each 1   finasteride (PROSCAR) 5 MG tablet Take 5 mg by mouth daily.     levalbuterol (XOPENEX HFA) 45 MCG/ACT inhaler Inhale 1 puff into the lungs every 6 (six) hours as needed for wheezing. 15 g 3   loratadine  (CLARITIN ) 10 MG tablet Take 1 tablet (10 mg total) by mouth daily. 90 tablet 1   montelukast  (SINGULAIR ) 10 MG tablet TAKE 1 TABLET AT BEDTIME 90 tablet 1   Multiple Vitamin (MULTIVITAMIN WITH MINERALS) TABS tablet Take 1 tablet by mouth daily.     OZEMPIC , 2 MG/DOSE, 8 MG/3ML SOPN INJECT 2MG  SUBCUTANEOUSLY   WEEKLY (EVERY 7 DAYS) AS   DIRECTED 9 mL 3   phenazopyridine  (PYRIDIUM ) 200 MG tablet Take 1 tablet (200 mg total) by mouth 3 (three) times daily as needed for pain. 10 tablet 0   Testosterone  12.5 MG/ACT (1%) GEL Place 1 Pump onto the skin daily. 75 g 5   Tezepelumab -ekko (TEZSPIRE ) 210 MG/1. SOAJ Inject 210 mg into the skin every 28 (twenty-eight) days. 1.91 mL 11   Current Facility-Administered Medications  Medication Dose Route Frequency Provider Last Rate Last Admin   tezepelumab -ekko (TEZSPIRE ) 210 MG/1. syringe 210 mg  210 mg Subcutaneous Q28 days Fabienne Holter, MD   210 mg at 05/20/22 1129    PHYSICAL EXAM: Vitals:   05/09/24 1016  BP: 126/70  Pulse: 78  Resp: 20  SpO2: 96%  Weight: 186 lb 12.8 oz (84.7 kg)  Height: 5' 4 (1.626 m)    Body mass index is 32.06 kg/m.  Wt Readings from Last 3 Encounters:  05/09/24 186 lb 12.8 oz (84.7 kg)  05/08/24 186 lb (84.4 kg)  04/05/24 184 lb 6.4 oz (83.6 kg)    General: Well developed, well nourished male in no apparent distress.  HEENT: AT/[place], no external lesions. Hearing intact to the spoken word Eyes: Conjunctiva clear and no icterus.  Abdomen: Soft, non tender Neurologic: Alert, oriented, normal speech, no gross focal neurological deficit Extremities: No pedal pitting edema Psychiatric: Does not appear depressed or anxious  PERTINENT HISTORIC LABORATORY AND IMAGING STUDIES:  All pertinent laboratory results were reviewed. Please see HPI also for further details.    CLINICAL DATA:  Pituitary adenoma or prolactinoma.   EXAM: June 17, 2023 : Reviewed images. MRI HEAD WITHOUT AND WITH CONTRAST   TECHNIQUE: Multiplanar, multiecho pulse sequences of the brain and surrounding structures were obtained without and with intravenous contrast.   CONTRAST:  10 cc Vueway    COMPARISON:  None Available.   FINDINGS: Brain: Diffusion imaging is normal. No abnormality affects the brainstem or cerebellum. Cerebral  hemispheres show mild age related volume loss with minimal small vessel change of the white matter. No cortical or large vessel territory infarction. No mass, hemorrhage, hydrocephalus or extra-axial collection.   The pituitary gland is normal in size. The  upper surface is concave. The infundibulum is midline. There is no macro adenoma. No visible micro adenoma.   Vascular: Major vessels at the base of the brain show flow.   Skull and upper cervical spine: Negative   Sinuses/Orbits: Clear/normal   Other: None   IMPRESSION: 1. Normal appearance of the brain for age except for minimal small vessel change of the cerebral hemispheric white matter. 2. Normal appearance of the pituitary gland. No macro adenoma or visible micro adenoma.  ASSESSMENT / PLAN  1. Hyperprolactinemia (HCC)    Patient has mild hyperprolactinemia, unknown etiology.  Macroprolactin negative.  He had MRI pituitary in July 2024 with unremarkable pituitary gland with no pituitary adenoma.  He has not been on antipsychotic medication.  No galactorrhea.  He was taking omeprazole  until recently was stopped in February 2025.  It has been rarely noted that omeprazole  can raise prolactin level as well.  Etiology of elevated prolactin is unclear at this time.  He is asymptomatic.  Pituitary adenoma has been ruled out.  Had MRI in July 2024.  Mild elevation of prolactin has not been bothering him.  In this clinical context no medication treatment is required.  However cabergoline can be considered for short-term.  Patient prefers not to be on medication if not absolutely necessary.  Plan: -Discussed about trial of treating with cabergoline.  Patient preferred not to be on medication and wants to monitor with laboratory test at this time. - Will recheck prolactin in the early morning fasting in 6 months.  Will make follow-up visit if the prolactin is abnormal at that time as well. - Follow-up with endocrinology in 1  year.  # Patient has type 2 diabetes mellitus controlled on Ozempic , managed by primary care provider.  He will continue to follow-up with primary care provider. # Patient has hypogonadism currently on testosterone  gel therapy, following with urology.  Diagnoses and all orders for this visit:  Hyperprolactinemia (HCC) -     Prolactin   DISPOSITION Follow up in clinic in 1 year suggested.   All questions answered and patient verbalized understanding of the plan.  Aaron Genee Rann, MD [facility] [address] [place], [address] [place], [place] [address] Phone # [phone]  At least part of this note was generated using voice recognition software. Inadvertent word errors may have occurred, which were not recognized during the proofreading process.

## 2024-06-26 ENCOUNTER — Other Ambulatory Visit: Payer: Self-pay | Admitting: Allergy and Immunology

## 2024-07-24 ENCOUNTER — Ambulatory Visit: Admitting: Allergy and Immunology

## 2024-08-21 ENCOUNTER — Ambulatory Visit (INDEPENDENT_AMBULATORY_CARE_PROVIDER_SITE_OTHER): Admitting: Allergy and Immunology

## 2024-08-21 ENCOUNTER — Encounter: Payer: Self-pay | Admitting: Allergy and Immunology

## 2024-08-21 ENCOUNTER — Other Ambulatory Visit: Payer: Self-pay

## 2024-08-21 VITALS — Ht 65.0 in | Wt 177.6 lb

## 2024-08-21 DIAGNOSIS — K219 Gastro-esophageal reflux disease without esophagitis: Secondary | ICD-10-CM

## 2024-08-21 DIAGNOSIS — J3089 Other allergic rhinitis: Secondary | ICD-10-CM

## 2024-08-21 DIAGNOSIS — J4550 Severe persistent asthma, uncomplicated: Secondary | ICD-10-CM

## 2024-08-21 DIAGNOSIS — L2089 Other atopic dermatitis: Secondary | ICD-10-CM

## 2024-08-21 MED ORDER — MONTELUKAST SODIUM 10 MG PO TABS: 10.0000 mg | ORAL_TABLET | Freq: Every day | ORAL | 5 refills | Status: AC

## 2024-08-21 MED ORDER — BECLOMETHASONE DIPROP HFA 40 MCG/ACT IN AERB: 2.0000 | INHALATION_SPRAY | RESPIRATORY_TRACT | 1 refills | Status: DC | PRN

## 2024-08-21 MED ORDER — MONTELUKAST SODIUM 10 MG PO TABS: 10.0000 mg | ORAL_TABLET | Freq: Every day | ORAL | 1 refills | Status: AC

## 2024-08-21 MED ORDER — ALBUTEROL SULFATE HFA 108 (90 BASE) MCG/ACT IN AERS: 2.0000 | INHALATION_SPRAY | RESPIRATORY_TRACT | 1 refills | Status: DC | PRN

## 2024-08-21 MED ORDER — FAMOTIDINE 20 MG PO TABS: 20.0000 mg | ORAL_TABLET | Freq: Every morning | ORAL | 3 refills | Status: AC

## 2024-08-21 NOTE — Patient Instructions (Addendum)
  1. Continue montelukast  10 mg -1 tablet 1 time per day  2. Continue OTC famotidine   3. Continue Claritin  and Albuterol  HFA and topical treatment if needed.  4. Action Plan for flare up:   A. Albuterol  + Qvar  40 - 2 inhalations TOGETHER every 4-6 hours  5. Return to clinic in 12 months or earlier if problem  6. Influenza = Tamiflu. Covid = Paxlovid

## 2024-08-21 NOTE — Progress Notes (Unsigned)
 [place] [facility] [place] [facility] [place] [facility] [place] [facility] [place]   Follow-up Note  Referring Provider: Kennyth Worth HERO, MD Primary Provider: Kennyth Worth HERO, MD Date of Office Visit: 08/21/2024  Subjective:   Aaron Drake (DOB: 01/07/1956) is a 68 y.o. 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  I last saw him in this clinic 10 January 2024.  He has done pretty well with his asthma since I last seen him in this clinic not requiring a systemic steroid to treat an exacerbation although he does use a short acting bronchodilator a few times per day.  He usually uses his short acting bronchodilator at the time of exercise.  He is intolerant of many different inhalers because of a problem with steroid induced laryngitis.  He discontinued his tezepelumab  injections for the past 6 months yet still continues to do very well.  This improvement in his airway comes in the context of losing approximately 60 pounds of weight over the course of the past 18 months and undergoing an exercise program and resolving his cardiac deconditioning.  He has done very well with his nose while using montelukast  and Claritin .  He has not required a antibiotic to treat an episode of sinusitis.  His atopic dermatitis has basically melted away and he rarely uses any topical agents.  Allergies as of 08/21/2024       Reactions   Airduo Digihaler  [fluticasone -salmeterol] Other (See Comments)   Redness -Head to toe; skin Peeling - Head to to   Lipitor [atorvastatin] Other (See Comments)   Muscle cramps and joint ache        Medication List        Accurate as of August 21, 2024  9:38 AM. If you have any questions, ask your nurse or doctor.          CALCIUM  + D3 PO Take 1 tablet by mouth daily.   EPINEPHrine  0.3 mg/0.3 mL Soaj  injection Commonly known as: EpiPen  2-Pak Inject 0.3 mg into the muscle as needed for anaphylaxis.   finasteride 5 MG tablet Commonly known as: PROSCAR Take 5 mg by mouth daily.   levalbuterol 45 MCG/ACT inhaler Commonly known as: XOPENEX HFA Inhale 1 puff into the lungs every 6 (six) hours as needed for wheezing.   loratadine  10 MG tablet Commonly known as: CLARITIN  Take 1 tablet (10 mg total) by mouth daily.   montelukast  10 MG tablet Commonly known as: SINGULAIR  TAKE 1 TABLET AT BEDTIME   multivitamin with minerals Tabs tablet Take 1 tablet by mouth daily.   Ozempic  (2 MG/DOSE) 8 MG/3ML Sopn Generic drug: Semaglutide  (2 MG/DOSE) INJECT 2MG  SUBCUTANEOUSLY  WEEKLY (EVERY 7 DAYS) AS   DIRECTED   phenazopyridine  200 MG tablet Commonly known as: Pyridium  Take 1 tablet (200 mg total) by mouth 3 (three) times daily as needed for pain.   Testosterone  12.5 MG/ACT (1%) Gel Place 1 Pump onto the skin daily.   Tezspire  210 MG/1. Soaj Generic drug: Tezepelumab -ekko Inject 210 mg into the skin every 28 (twenty-eight) days.        Past Medical History:  Diagnosis Date  . Allergy   . Arthritis   . Asthma 05/12/2012  . BPH (benign prostatic hyperplasia) 05/19/2012  . Diabetes mellitus without complication (HCC)   . Dyspnea    physical stress; exercise   .  History of kidney stones   . Hyperlipidemia   . Impaired glucose tolerance 05/19/2012  . Tear of meniscus of right knee 2018   partial tear    Past Surgical History:  Procedure Laterality Date  . BALLOON DILATION N/A 06/10/2022   Procedure: URETHRAL BALLOON DILATION-OPTILUME;  Surgeon: Cam Morene ORN, MD;  [facility];  Service: Urology;  Laterality: N/A;  . CATARACT EXTRACTION Right   . CYSTOSCOPY WITH RETROGRADE URETHROGRAM N/A 06/10/2022   Procedure: CYSTOSCOPY WITH RETROGRAM URETHROGRAM;  Surgeon: Cam Morene ORN, MD;  Location: [facility];  Service: Urology;   Laterality: N/A;  . EXTRACORPOREAL SHOCK WAVE LITHOTRIPSY Left 05/08/2018   Procedure: LEFT EXTRACORPOREAL SHOCK WAVE LITHOTRIPSY (ESWL);  Surgeon: Nieves Cough, MD;  Location: [name];  Service: Urology;  Laterality: Left;  . INSERTION OF MESH N/A 05/17/2016   Procedure: INSERTION OF MESH;  Surgeon: Cough Bury, MD;  Location: [facility];  Service: General;  Laterality: N/A;  . UMBILICAL HERNIA REPAIR N/A 05/17/2016   Procedure: UMBILICAL HERNIA REPAIR;  Surgeon: Cough Bury, MD;  Location: [facility];  Service: General;  Laterality: N/A;  . WISDOM TOOTH EXTRACTION      Review of systems negative except as noted in HPI / PMHx or noted below:  ROS   Objective:   There were no vitals filed for this visit. Height: 5' 5 (165.1 cm)  Weight: 177 lb 9.6 oz (80.6 kg)   Physical Exam  Diagnostics:    Spirometry was performed and demonstrated an FEV1 of 1.51 at 57 % of predicted.  The patient had an Asthma Control Test with the following results:  .    Assessment and Plan:   No diagnosis found.  Patient Instructions   1. Continue montelukast  10 mg -1 tablet 1 time per day  2. Continue OTC famotidine   3. Continue Claritin  and Albuterol  HFA and topical treatment if needed.  4. Action Plan for flare up:   A. Albuterol  + Qvar  40 - 2 inhalations TOGETHER every 4-6 hours  5. Return to clinic in 12 months or earlier if problem  6. Influenza = Tamiflu. Covid = Paxlovid         Camellia Denis, MD Allergy / Immunology [facility]

## 2024-08-22 ENCOUNTER — Encounter: Payer: Self-pay | Admitting: Allergy and Immunology

## 2024-08-22 ENCOUNTER — Other Ambulatory Visit: Payer: Self-pay | Admitting: Allergy and Immunology

## 2024-08-24 ENCOUNTER — Encounter: Payer: Self-pay | Admitting: Allergy and Immunology

## 2024-10-08 ENCOUNTER — Encounter: Payer: Self-pay | Admitting: Family Medicine

## 2024-10-08 ENCOUNTER — Ambulatory Visit: Admitting: Family Medicine

## 2024-10-08 VITALS — BP 132/70 | HR 64 | Temp 98.1°F | Ht 65.0 in | Wt 178.2 lb

## 2024-10-08 DIAGNOSIS — R7989 Other specified abnormal findings of blood chemistry: Secondary | ICD-10-CM

## 2024-10-08 DIAGNOSIS — E1169 Type 2 diabetes mellitus with other specified complication: Secondary | ICD-10-CM

## 2024-10-08 DIAGNOSIS — E785 Hyperlipidemia, unspecified: Secondary | ICD-10-CM | POA: Diagnosis not present

## 2024-10-08 DIAGNOSIS — E1159 Type 2 diabetes mellitus with other circulatory complications: Secondary | ICD-10-CM | POA: Diagnosis not present

## 2024-10-08 DIAGNOSIS — E119 Type 2 diabetes mellitus without complications: Secondary | ICD-10-CM

## 2024-10-08 DIAGNOSIS — Z0000 Encounter for general adult medical examination without abnormal findings: Secondary | ICD-10-CM

## 2024-10-08 DIAGNOSIS — I152 Hypertension secondary to endocrine disorders: Secondary | ICD-10-CM

## 2024-10-08 DIAGNOSIS — N401 Enlarged prostate with lower urinary tract symptoms: Secondary | ICD-10-CM

## 2024-10-08 DIAGNOSIS — R338 Other retention of urine: Secondary | ICD-10-CM

## 2024-10-08 DIAGNOSIS — Z0001 Encounter for general adult medical examination with abnormal findings: Secondary | ICD-10-CM

## 2024-10-08 DIAGNOSIS — Z7985 Long-term (current) use of injectable non-insulin antidiabetic drugs: Secondary | ICD-10-CM | POA: Diagnosis not present

## 2024-10-08 DIAGNOSIS — J4550 Severe persistent asthma, uncomplicated: Secondary | ICD-10-CM

## 2024-10-08 LAB — COMPREHENSIVE METABOLIC PANEL WITH GFR
ALT: 22 U/L (ref 0–53)
AST: 19 U/L (ref 0–37)
Albumin: 4.7 g/dL (ref 3.5–5.2)
Alkaline Phosphatase: 58 U/L (ref 39–117)
BUN: 23 mg/dL (ref 6–23)
CO2: 25 meq/L (ref 19–32)
Calcium: 9.7 mg/dL (ref 8.4–10.5)
Chloride: 103 meq/L (ref 96–112)
Creatinine, Ser: 1.03 mg/dL (ref 0.40–1.50)
GFR: 73.85 mL/min (ref >60.00)
Glucose, Bld: 72 mg/dL (ref 70–99)
Potassium: 4 meq/L (ref 3.5–5.1)
Sodium: 138 meq/L (ref 135–145)
Total Bilirubin: 0.5 mg/dL (ref 0.2–1.2)
Total Protein: 6.7 g/dL (ref 6.0–8.3)

## 2024-10-08 LAB — LIPID PANEL
Cholesterol: 222 mg/dL — ABNORMAL HIGH (ref 0–200)
HDL: 48.7 mg/dL (ref >39.00)
LDL Cholesterol: 148 mg/dL — ABNORMAL HIGH (ref 0–99)
NonHDL: 173.02
Total CHOL/HDL Ratio: 5
Triglycerides: 123 mg/dL (ref 0.0–149.0)
VLDL: 24.6 mg/dL (ref 0.0–40.0)

## 2024-10-08 LAB — CBC
HCT: 45.3 % (ref 39.0–52.0)
Hemoglobin: 15.5 g/dL (ref 13.0–17.0)
MCHC: 34.2 g/dL (ref 30.0–36.0)
MCV: 91.4 fl (ref 78.0–100.0)
Platelets: 246 K/uL (ref 150.0–400.0)
RBC: 4.96 Mil/uL (ref 4.22–5.81)
RDW: 14.1 % (ref 11.5–15.5)
WBC: 8.6 K/uL (ref 4.0–10.5)

## 2024-10-08 LAB — TSH: TSH: 1.11 u[IU]/mL (ref 0.35–5.50)

## 2024-10-08 LAB — HEMOGLOBIN A1C: Hgb A1c MFr Bld: 5.4 % (ref 4.6–6.5)

## 2024-10-08 NOTE — Assessment & Plan Note (Signed)
 Check lipids.  Has not been able to tolerate statins in the past due to myalgia.

## 2024-10-08 NOTE — Assessment & Plan Note (Signed)
 At goal today without medications.

## 2024-10-08 NOTE — Progress Notes (Signed)
 =  Chief Complaint:  Aaron Drake is a 68 y.o. male who presents today for his annual comprehensive physical exam.    Assessment/Plan:  Chronic Problems Addressed Today: Hypertension associated with diabetes (HCC) At goal today without medications.   Hyperlipidemia associated with type 2 diabetes mellitus (HCC) Check lipids.  Has not been able to tolerate statins in the past due to myalgia.  Low testosterone  Follows with urology.  On testosterone  replacement.  Labs checked most recent labs into the chart.  Type 2 diabetes mellitus without complication, without long-term current use of insulin  (HCC) Check A1c with labs.  Doing well with Ozempic  2 mg weekly.  Asthma, severe persistent, well-controlled Continue management per allergist.  He has a prescription for Pulmicort to use as needed.  BPH (benign prostatic hyperplasia) Follows with urology.  Last PSA was within normal ranges.   Preventative Healthcare: Check labs.  Flu vaccine declined.  Due for colonoscopy in 2028.  Patient Counseling(The following topics were reviewed and/or handout was given):  -Nutrition: Stressed importance of moderation in sodium/caffeine intake, saturated fat and cholesterol, caloric balance, sufficient intake of fresh fruits, vegetables, and fiber.  -Stressed the importance of regular exercise.   -Substance Abuse: Discussed cessation/primary prevention of tobacco, alcohol, or other drug use; driving or other dangerous activities under the influence; availability of treatment for abuse.   -Injury prevention: Discussed safety belts, safety helmets, smoke detector, smoking near bedding or upholstery.   -Sexuality: Discussed sexually transmitted diseases, partner selection, use of condoms, avoidance of unintended pregnancy and contraceptive alternatives.   -Dental health: Discussed importance of regular tooth brushing, flossing, and dental visits.  -Health maintenance and immunizations reviewed. Please  refer to Health maintenance section.  Return to care in 1 year for next preventative visit.     Subjective:  HPI:  He has no acute complaints today. Patient is here today for his annual physical.  See assessment / plan for status of chronic conditions.  Discussed the use of AI scribe software for clinical note transcription with the patient, who gave verbal consent to proceed.  History of Present Illness Aaron Drake is a 68 year old male who presents for an annual physical exam.  He had a urology visit three weeks ago. Recent lab results show testosterone  levels are stable, but prolactin remains elevated, though less so than previously. He is currently being monitored with follow-up intervals extended to annually.  He has lost approximately eight pounds since the summer, attributing this to a balance between fat loss and muscle gain through regular exercise. He exercises by targeting every muscle group twice a week using a weight bench, free weights, and a multifunction cable machine at his home gym.  He follows a low carbohydrate diet. His cholesterol has historically been high, with an LDL of 133, and he has been taking a supplement called metokinase for the past two years.  He has a history of cataract surgeries, the first in 2006 and the second at a later date. 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       10/08/2024   10:20 AM  Depression screen PHQ 2/9  Decreased Interest 0  Down, Depressed, Hopeless 0  PHQ - 2 Score 0    Health Maintenance Due  Topic Date Due   Diabetic kidney evaluation - Urine ACR  Never done   FOOT EXAM  10/31/2019   DTaP/Tdap/Td (3 -  Td or Tdap) 05/19/2022   OPHTHALMOLOGY EXAM  07/18/2024   Diabetic kidney evaluation - eGFR measurement  10/05/2024   HEMOGLOBIN A1C  10/06/2024     ROS: Per HPI, otherwise a complete review of systems was negative.   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   Low testosterone  10/26/2022   Urethral stricture 09/24/2022   Left sided sciatica 09/21/2021   Asthma, severe persistent, well-controlled (HCC) 10/30/2019   Other allergic rhinitis 10/30/2019   Other atopic dermatitis 10/30/2019   Gastroesophageal reflux disease 10/30/2019   Hypertension associated with diabetes (HCC) 04/30/2019   Erosive oral lichen planus, Dx via Bx, Tx with Dexamethasone  rinse, followed by Periodontist 01/28/2019   Primary osteoarthritis of right knee 08/15/2018   Renal calculi 06/21/2018   Morbid obesity (HCC) 03/26/2017   Type 2 diabetes mellitus without complication, without long-term current use of insulin  (HCC) 03/26/2017   BPH (benign prostatic hyperplasia) 05/19/2012   Hyperlipidemia associated with type 2 diabetes mellitus (HCC)    Past Surgical History:  Procedure Laterality Date   BALLOON DILATION N/A 06/10/2022   Procedure: URETHRAL BALLOON DILATION-OPTILUME;  Surgeon: Cam Morene ORN, MD;  Location: [facility];  Service: Urology;  Laterality: N/A;   CATARACT EXTRACTION Right    CYSTOSCOPY WITH RETROGRADE URETHROGRAM N/A 06/10/2022   Procedure: CYSTOSCOPY WITH RETROGRAM URETHROGRAM;  Surgeon: Cam Morene ORN, MD;  Location[facility];  Service: Urology;  Laterality: N/A;   EXTRACORPOREAL SHOCK WAVE LITHOTRIPSY Left 05/08/2018   Procedure: LEFT EXTRACORPOREAL SHOCK WAVE LITHOTRIPSY (ESWL);  Surgeon: Nieves Cough,  MD;  Location: [name];  Service: Urology;  Laterality: Left;   INSERTION OF MESH N/A 05/17/2016   Procedure: INSERTION OF MESH;  Surgeon: Cough Bury, MD;  Location: [facility];  Service: General;  Laterality: N/A;   UMBILICAL HERNIA REPAIR N/A 05/17/2016   Procedure: UMBILICAL HERNIA REPAIR;  Surgeon: Cough Bury, MD;  Location: [facility];  Service: General;  Laterality: N/A;   WISDOM TOOTH EXTRACTION      Family History  Problem Relation Age of Onset   Heart disease Mother    Lung cancer Mother    Stroke Father    COPD Father        Smoker   Aortic aneurysm Father    Colon cancer Maternal Uncle     Medications- reviewed and updated Current Outpatient Medications  Medication Sig Dispense Refill   albuterol  (VENTOLIN  HFA) 108 (90 Base) MCG/ACT inhaler Inhale 2 puffs into the lungs every 6 (six) hours as needed for wheezing or shortness of breath (add with pulmicort 180 2 puffs each every 4-6 hours as needed.). 18 g 3   Budesonide (PULMICORT FLEXHALER) 90 MCG/ACT inhaler Inhale 2 puffs into the lungs every 6 (six) hours as needed (add with albuterol  2 puffs every 6 hours as needed.). 1 each 3   Calcium  Carb-Cholecalciferol  (CALCIUM  + D3 PO) Take 1 tablet by mouth daily.     EPINEPHrine  (  EPIPEN  2-PAK) 0.3 mg/0.3 mL IJ SOAJ injection Inject 0.3 mg into the muscle as needed for anaphylaxis. 1 each 1   famotidine  (PEPCID ) 20 MG tablet Take 1 tablet (20 mg total) by mouth every morning. 90 tablet 3   finasteride (PROSCAR) 5 MG tablet Take 5 mg by mouth daily.     levalbuterol (XOPENEX HFA) 45 MCG/ACT inhaler Inhale 1 puff into the lungs every 6 (six) hours as needed for wheezing. 15 g 3   loratadine  (CLARITIN ) 10 MG tablet Take 1 tablet (10 mg total) by mouth daily. 90 tablet 1   montelukast  (SINGULAIR ) 10 MG tablet Take 1 tablet (10 mg total) by mouth at bedtime. 30 tablet 5   montelukast  (SINGULAIR ) 10 MG tablet Take 1 tablet (10 mg total) by mouth at bedtime. 90 tablet 1   Multiple  Vitamin (MULTIVITAMIN WITH MINERALS) TABS tablet Take 1 tablet by mouth daily.     OZEMPIC , 2 MG/DOSE, 8 MG/3ML SOPN INJECT 2MG  SUBCUTANEOUSLY  WEEKLY (EVERY 7 DAYS) AS   DIRECTED 9 mL 3   Testosterone  12.5 MG/ACT (1%) GEL Place 1 Pump onto the skin daily. 75 g 5   No current facility-administered medications for this visit.    Allergies-reviewed and updated Allergies  Allergen Reactions   Airduo Digihaler  [Fluticasone -Salmeterol] Other (See Comments)    Redness -Head to toe; skin Peeling - Head to to   Lipitor [Atorvastatin] Other (See Comments)    Muscle cramps and joint ache    Social History   Socioeconomic History   Marital status: Single    Spouse name: Not on file   Number of children: Not on file   Years of education: Not on file   Highest education level: Master's degree (e.g., MA, MS, MEng, MEd, MSW, MBA)  Occupational History   Occupation: catholic priest  Tobacco Use   Smoking status: Former    Current packs/day: 0.00    Types: Cigarettes    Quit date: 1998    Years since quitting: 27.8   Smokeless tobacco: Never  Vaping Use   Vaping status: Never Used  Substance and Sexual Activity   Alcohol use: Yes    Alcohol/week: 1.0 standard drink of alcohol    Types: 1 Shots of liquor per week    Comment: rare   Drug use: No   Sexual activity: Not Currently  Other Topics Concern   Not on file  Social History Narrative   Not on file   Social Drivers of Health   Financial Resource Strain: Low Risk  (10/04/2024)   Overall Financial Resource Strain (CARDIA)    Difficulty of Paying Living Expenses: Not hard at all  Food Insecurity: No Food Insecurity (10/04/2024)   Hunger Vital Sign    Worried About Running Out of Food in the Last Year: Never true    Ran Out of Food in the Last Year: Never true  Transportation Needs: No Transportation Needs (10/04/2024)   PRAPARE - Administrator, Civil Service (Medical): No    Lack of Transportation (Non-Medical): No   Physical Activity: Sufficiently Active (10/04/2024)   Exercise Vital Sign    Days of Exercise per Week: 6 days    Minutes of Exercise per Session: 60 min  Stress: No Stress Concern Present (10/04/2024)   [employer] of Occupational Health - Occupational Stress Questionnaire    Feeling of Stress: Not at all  Social Connections: Socially Isolated (10/04/2024)   Social Connection and Isolation Panel    Frequency  of Communication with Friends and Family: Never    Frequency of Social Gatherings with Friends and Family: Never    Attends Religious Services: More than 4 times per year    Active Member of [employer] or Organizations: No    Attends Engineer, Structural: Not on file    Marital Status: Never married        Objective:  Physical Exam: BP 132/70   Pulse 64   Temp 98.1 F (36.7 C) (Temporal)   Ht 5' 5 (1.651 m)   Wt 178 lb 3.2 oz (80.8 kg)   SpO2 98%   BMI 29.65 kg/m   Body mass index is 29.65 kg/m. Wt Readings from Last 3 Encounters:  10/08/24 178 lb 3.2 oz (80.8 kg)  08/21/24 177 lb 9.6 oz (80.6 kg)  05/09/24 186 lb 12.8 oz (84.7 kg)   Gen: NAD, resting comfortably HEENT: TMs normal bilaterally. OP clear. No thyromegaly noted.  CV: RRR with no murmurs appreciated Pulm: NWOB, CTAB with no crackles, wheezes, or rhonchi GI: Normal bowel sounds present. Soft, Nontender, Nondistended. MSK: no edema, cyanosis, or clubbing noted Skin: warm, dry Neuro: CN2-12 grossly intact. Strength 5/5 in upper and lower extremities. Reflexes symmetric and intact bilaterally.  Psych: Normal affect and thought content     Molley Houser M. Kennyth, MD 10/08/2024 10:45 AM

## 2024-10-08 NOTE — Patient Instructions (Signed)
 It was very nice to see you today!  VISIT SUMMARY: You had your annual physical exam today. Your weight has decreased by 8 pounds, and you are managing your diabetes well with lifestyle changes. We discussed your cholesterol levels and ordered some blood tests, including A1c and cholesterol. You are also overdue for an eye exam.  YOUR PLAN: ADULT WELLNESS VISIT: Routine visit with no acute issues. Weight decreased by 8 pounds. Overdue for an eye exam -Continue your regular exercise routine and low carbohydrate diet. -Schedule an eye exam as soon as possible. -Blood work ordered, including A1c and cholesterol. -Follow-up scheduled in 6 months.  TYPE 2 DIABETES MELLITUS: Well-managed with lifestyle modifications. Last A1c checked 6 months ago. -Continue managing your diabetes with your current lifestyle modifications. -A1c test ordered.  HYPERLIPIDEMIA: Chronic high cholesterol with LDL at 133 mg/dL. Discussed cholesterol variability and cardiovascular risk. -Cholesterol test ordered.  Return in about 6 months (around 04/07/2025) for Follow Up.   Take care, Dr Kennyth  PLEASE NOTE:  If you had any lab tests, please let us  know if you have not heard back within a few days. You may see your results on mychart before we have a chance to review them but we will give you a call once they are reviewed by us .   If we ordered any referrals today, please let us  know if you have not heard from their office within the next week.   If you had any urgent prescriptions sent in today, please check with the pharmacy within an hour of our visit to make sure the prescription was transmitted appropriately.   Please try these tips to maintain a healthy lifestyle:  Eat at least 3 REAL meals and 1-2 snacks per day.  Aim for no more than 5 hours between eating.  If you eat breakfast, please do so within one hour of getting up.   Each meal should contain half fruits/vegetables, one quarter protein, and one  quarter carbs (no bigger than a computer mouse)  Cut down on sweet beverages. This includes juice, soda, and sweet tea.   Drink at least 1 glass of water  with each meal and aim for at least 8 glasses per day  Exercise at least 150 minutes every week.     Preventive Care 60 Years and Older, Male Preventive care refers to lifestyle choices and visits with your health care provider that can promote health and wellness. Preventive care visits are also called wellness exams. What can I expect for my preventive care visit? Counseling During your preventive care visit, your health care provider may ask about your: Medical history, including: Past medical problems. Family medical history. History of falls. Current health, including: Emotional well-being. Home life and relationship well-being. Sexual activity. Memory and ability to understand (cognition). Lifestyle, including: Alcohol, nicotine or tobacco, and drug use. Access to firearms. Diet, exercise, and sleep habits. Work and work astronomer. Sunscreen use. Safety issues such as seatbelt and bike helmet use. Physical exam Your health care provider will check your: Height and weight. These may be used to calculate your BMI (body mass index). BMI is a measurement that tells if you are at a healthy weight. Waist circumference. This measures the distance around your waistline. This measurement also tells if you are at a healthy weight and may help predict your risk of certain diseases, such as type 2 diabetes and high blood pressure. Heart rate and blood pressure. Body temperature. Skin for abnormal spots. What immunizations do I  need?  Vaccines are usually given at various ages, according to a schedule. Your health care provider will recommend vaccines for you based on your age, medical history, and lifestyle or other factors, such as travel or where you work. What tests do I need? Screening Your health care provider may  recommend screening tests for certain conditions. This may include: Lipid and cholesterol levels. Diabetes screening. This is done by checking your blood sugar (glucose) after you have not eaten for a while (fasting). Hepatitis C test. Hepatitis B test. HIV (human immunodeficiency virus) test. STI (sexually transmitted infection) testing, if you are at risk. Lung cancer screening. Colorectal cancer screening. Prostate cancer screening. Abdominal aortic aneurysm (AAA) screening. You may need this if you are a current or former smoker. Talk with your health care provider about your test results, treatment options, and if necessary, the need for more tests. Follow these instructions at home: Eating and drinking  Eat a diet that includes fresh fruits and vegetables, whole grains, lean protein, and low-fat dairy products. Limit your intake of foods with high amounts of sugar, saturated fats, and salt. Take vitamin and mineral supplements as recommended by your health care provider. Do not drink alcohol if your health care provider tells you not to drink. If you drink alcohol: Limit how much you have to 0-2 drinks a day. Know how much alcohol is in your drink. In the [place] one drink equals one 12 oz bottle of beer (355 mL), one 5 oz glass of wine (148 mL), or one 1 oz glass of hard liquor (44 mL). Lifestyle Brush your teeth every morning and night with fluoride toothpaste. Floss one time each day. Exercise for at least 30 minutes 5 or more days each week. Do not use any products that contain nicotine or tobacco. These products include cigarettes, chewing tobacco, and vaping devices, such as e-cigarettes. If you need help quitting, ask your health care provider. Do not use drugs. If you are sexually active, practice safe sex. Use a condom or other form of protection to prevent STIs. Take aspirin  only as told by your health care provider. Make sure that you understand how much to take and what  form to take. Work with your health care provider to find out whether it is safe and beneficial for you to take aspirin  daily. Ask your health care provider if you need to take a cholesterol-lowering medicine (statin). Find healthy ways to manage stress, such as: Meditation, yoga, or listening to music. Journaling. Talking to a trusted person. Spending time with friends and family. Safety Always wear your seat belt while driving or riding in a vehicle. Do not drive: If you have been drinking alcohol. Do not ride with someone who has been drinking. When you are tired or distracted. While texting. If you have been using any mind-altering substances or drugs. Wear a helmet and other protective equipment during sports activities. If you have firearms in your house, make sure you follow all gun safety procedures. Minimize exposure to UV radiation to reduce your risk of skin cancer. What's next? Visit your health care provider once a year for an annual wellness visit. Ask your health care provider how often you should have your eyes and teeth checked. Stay up to date on all vaccines. This information is not intended to replace advice given to you by your health care provider. Make sure you discuss any questions you have with your health care provider. Document Revised: 05/13/2021 Document Reviewed: 05/13/2021 Elsevier  Patient Education  2024 [employer].

## 2024-10-08 NOTE — Assessment & Plan Note (Signed)
 Continue management per allergist.  He has a prescription for Pulmicort to use as needed.

## 2024-10-08 NOTE — Assessment & Plan Note (Signed)
 Follows with urology.  Last PSA was within normal ranges.

## 2024-10-08 NOTE — Assessment & Plan Note (Signed)
 Follows with urology.  On testosterone  replacement.  Labs checked most recent labs into the chart.

## 2024-10-08 NOTE — Assessment & Plan Note (Signed)
 Check A1c with labs.  Doing well with Ozempic  2 mg weekly.

## 2024-10-11 ENCOUNTER — Ambulatory Visit: Payer: Self-pay | Admitting: Family Medicine

## 2024-10-11 NOTE — Progress Notes (Signed)
 His cholesterol is elevated.  The rest of his labs are at goal.  It would be a good idea to try to lower his cholesterol to prevent heart attack and stroke.  I know he has not done well with statins in the past.  There is a statin alternative called Zetia that we can try.  We could also refer him to the lipid clinic with cardiology to discuss alternative treatment options. Please send in medication(s) or referral based on his preference.   Regardless, he should also continue to work on diet and exercise and we should recheck this again in 3 to 6 months.  We will recheck his A1c at his next visit here.  All of his other labs are stable and we can recheck in a year.

## 2024-10-12 NOTE — Telephone Encounter (Signed)
 Noted

## 2025-04-08 ENCOUNTER — Ambulatory Visit: Admitting: Family Medicine

## 2025-08-20 ENCOUNTER — Ambulatory Visit: Admitting: Allergy and Immunology
